# Patient Record
Sex: Male | Born: 1965
Health system: Southern US, Community
[De-identification: ages and names within clinical notes are randomized; demographics above are authoritative.]

## PROBLEM LIST (undated history)

## (undated) DIAGNOSIS — T8859XA Other complications of anesthesia, initial encounter: Secondary | ICD-10-CM

## (undated) DIAGNOSIS — N529 Male erectile dysfunction, unspecified: Secondary | ICD-10-CM

## (undated) DIAGNOSIS — M1990 Unspecified osteoarthritis, unspecified site: Secondary | ICD-10-CM

## (undated) DIAGNOSIS — N289 Disorder of kidney and ureter, unspecified: Secondary | ICD-10-CM

## (undated) DIAGNOSIS — Z87442 Personal history of urinary calculi: Secondary | ICD-10-CM

## (undated) DIAGNOSIS — IMO0001 Reserved for inherently not codable concepts without codable children: Secondary | ICD-10-CM

## (undated) DIAGNOSIS — K219 Gastro-esophageal reflux disease without esophagitis: Secondary | ICD-10-CM

## (undated) DIAGNOSIS — T8484XA Pain due to internal orthopedic prosthetic devices, implants and grafts, initial encounter: Secondary | ICD-10-CM

## (undated) DIAGNOSIS — C801 Malignant (primary) neoplasm, unspecified: Secondary | ICD-10-CM

## (undated) DIAGNOSIS — I10 Essential (primary) hypertension: Secondary | ICD-10-CM

## (undated) HISTORY — PX: FOOT FRACTURE SURGERY: SHX645

## (undated) HISTORY — DX: Essential (primary) hypertension: I10

## (undated) HISTORY — PX: BREAST SURGERY: SHX581

## (undated) HISTORY — DX: Reserved for inherently not codable concepts without codable children: IMO0001

## (undated) HISTORY — DX: Male erectile dysfunction, unspecified: N52.9

## (undated) HISTORY — PX: OTHER SURGICAL HISTORY: SHX169

---

## 2006-04-29 ENCOUNTER — Encounter: Admission: RE | Admit: 2006-04-29 | Discharge: 2006-07-28 | Payer: Self-pay | Admitting: Family Medicine

## 2009-02-06 ENCOUNTER — Ambulatory Visit (HOSPITAL_COMMUNITY): Admission: RE | Admit: 2009-02-06 | Discharge: 2009-02-06 | Payer: Self-pay | Admitting: Surgery

## 2009-02-06 HISTORY — PX: HERNIA REPAIR: SHX51

## 2009-12-31 ENCOUNTER — Emergency Department (HOSPITAL_COMMUNITY): Admission: EM | Admit: 2009-12-31 | Discharge: 2009-12-31 | Payer: Self-pay | Admitting: Family Medicine

## 2010-07-04 LAB — BASIC METABOLIC PANEL
Calcium: 9.1 mg/dL (ref 8.4–10.5)
GFR calc Af Amer: >60 mL/min (ref >60)
GFR calc non Af Amer: >60 mL/min (ref >60)
Sodium: 142 mEq/L (ref 135–145)

## 2010-07-10 ENCOUNTER — Emergency Department (HOSPITAL_COMMUNITY)
Admission: EM | Admit: 2010-07-10 | Discharge: 2010-07-11 | Disposition: A | Discharge status: Home / Self Care | Payer: 59 | Attending: Emergency Medicine | Admitting: Emergency Medicine

## 2010-07-10 DIAGNOSIS — T50901A Poisoning by unspecified drugs, medicaments and biological substances, accidental (unintentional), initial encounter: Secondary | ICD-10-CM | POA: Insufficient documentation

## 2010-07-10 DIAGNOSIS — F3289 Other specified depressive episodes: Secondary | ICD-10-CM | POA: Insufficient documentation

## 2010-07-10 DIAGNOSIS — F329 Major depressive disorder, single episode, unspecified: Secondary | ICD-10-CM | POA: Insufficient documentation

## 2010-07-10 DIAGNOSIS — I10 Essential (primary) hypertension: Secondary | ICD-10-CM | POA: Insufficient documentation

## 2010-07-10 LAB — CBC
HCT: 44.1 % (ref 39.0–52.0)
Hemoglobin: 15 g/dL (ref 13.0–17.0)
WBC: 8.9 10*3/uL (ref 4.0–10.5)

## 2010-07-10 LAB — DIFFERENTIAL
Basophils Absolute: 0 10*3/uL (ref 0.0–0.1)
Lymphocytes Relative: 18 % (ref 12–46)
Neutro Abs: 6.2 10*3/uL (ref 1.7–7.7)
Neutrophils Relative %: 70 % (ref 43–77)

## 2010-07-10 LAB — BLOOD GAS, ARTERIAL
Bicarbonate: 24.8 mEq/L — ABNORMAL HIGH (ref 20.0–24.0)
O2 Content: 2 L/min
O2 Saturation: 96.8 %
pCO2 arterial: 40.9 mmHg (ref 35.0–45.0)
pO2, Arterial: 86.3 mmHg (ref 80.0–100.0)

## 2010-07-10 LAB — COMPREHENSIVE METABOLIC PANEL
ALT: 44 U/L (ref 0–53)
AST: 47 U/L — ABNORMAL HIGH (ref 0–37)
Albumin: 3.8 g/dL (ref 3.5–5.2)
CO2: 23 mEq/L (ref 19–32)
Calcium: 8.5 mg/dL (ref 8.4–10.5)
Chloride: 109 mEq/L (ref 96–112)
GFR calc Af Amer: >60 mL/min (ref >60)
GFR calc non Af Amer: >60 mL/min (ref >60)
Sodium: 139 mEq/L (ref 135–145)
Total Bilirubin: 0.8 mg/dL (ref 0.3–1.2)

## 2010-11-20 ENCOUNTER — Ambulatory Visit (INDEPENDENT_AMBULATORY_CARE_PROVIDER_SITE_OTHER): Payer: 59 | Admitting: Surgery

## 2010-11-28 ENCOUNTER — Encounter (INDEPENDENT_AMBULATORY_CARE_PROVIDER_SITE_OTHER): Payer: Self-pay | Admitting: Surgery

## 2010-11-28 ENCOUNTER — Encounter (INDEPENDENT_AMBULATORY_CARE_PROVIDER_SITE_OTHER): Payer: Self-pay

## 2010-11-28 ENCOUNTER — Ambulatory Visit (INDEPENDENT_AMBULATORY_CARE_PROVIDER_SITE_OTHER): Payer: 59 | Admitting: Surgery

## 2010-11-28 VITALS — BP 114/72 | HR 78 | Temp 96.7°F | Ht 73.0 in | Wt 285.8 lb

## 2010-11-28 DIAGNOSIS — IMO0001 Reserved for inherently not codable concepts without codable children: Secondary | ICD-10-CM | POA: Insufficient documentation

## 2010-11-28 DIAGNOSIS — E669 Obesity, unspecified: Secondary | ICD-10-CM

## 2010-11-28 DIAGNOSIS — R1031 Right lower quadrant pain: Secondary | ICD-10-CM | POA: Insufficient documentation

## 2010-11-28 DIAGNOSIS — R1906 Epigastric swelling, mass or lump: Secondary | ICD-10-CM | POA: Insufficient documentation

## 2010-11-28 NOTE — Patient Instructions (Signed)
Managing Pain  Pain after surgery or related to activity is often due to strain/injury to muscle, tendon, nerves and/or incisions.  This pain is usually short-term and will improve in a few months.   Many people find it helpful to do the following things TOGETHER to help speed the process of healing and to get back to regular activity more quickly:  1. Avoid heavy physical activity a.  no lifting greater than 20 pounds b. Do not "push through" the pain.  Listen to your body and avoid positions and maneuvers than reproduce the pain c. Walking is okay as tolerated, but go slowly and stop when getting sore.  d. Remember: If it hurts to do it, then don't do it! 2. Take Anti-inflammatory medication  a. Take with food/snack around the clock for 1-2 weeks i. This helps the muscle and nerve tissues become less irritable and calm down faster b. Choose ONE of the following over-the-counter medications: i. Naproxen 220mg tabs (ex. Aleve) 2 pills twice a day  ii. Ibuprofen 200mg tabs (ex. Advil, Motrin) 3-4 pills with every meal and just before bedtime iii. Acetaminophen 500mg tabs (Tylenol) 1-2 pills with every meal and just before bedtime 3. Use a Heating pad or Ice/Cold Pack a. 4-6 times a day b. May use warm bath/hottub  or showers 4. Try Gentle Massage and/or Stretching  a. at the area of pain many times a day b. stop if you feel pain - do not overdo it  Try these steps together to help you body heal faster and avoid making things get worse.  Doing just one of these things may not be enough.    If you are not getting better after two weeks or are noticing you are getting worse, contact our office for further advice; we may need to re-evaluate you & see what other things we can do to help.   

## 2010-11-28 NOTE — Progress Notes (Signed)
Subjective:     Patient ID: Aaron Moore, male   DOB: 01/28/1966, 45 y.o.   MRN: [id number]  HPI  Diagnosis: Supraumbilical and umbilical ventral hernia  Procedure: Diagnostic laparoscopy and laparoscopic ventral hernia underlay meshx2 November 2010  Reason for visit: Abdominal mass and pain.  Patient is a obese male who I did a surgical repair of hernias almost 2 years ago. He notes every 6 months he will occasionally get discomfort to the right and inferior to his belly button. It lasts for a few days and goes away. He takes ibuprofen for it. He also notes that since his surgery he's always had a lump just superior and to the left of his belly button. The site of suprumbilical hernia spot. It does not get larger or smaller. He just notes a subtle mass there. He was concerned about a recurrent hernia. He has gained about 20 pounds since we saw him 2 years ago. He knows that this and is trying to lose weight.  Review of Systems  Constitutional: Negative for fever, chills and diaphoresis.  HENT: Negative for nosebleeds, sore throat, facial swelling, mouth sores, trouble swallowing and ear discharge.   Eyes: Negative for photophobia, discharge and visual disturbance.  Respiratory: Negative for choking, chest tightness, shortness of breath and stridor.   Cardiovascular: Negative for chest pain and palpitations.  Gastrointestinal: Negative for nausea, vomiting, diarrhea, constipation, blood in stool, abdominal distention, anal bleeding and rectal pain.       Per HPI  Genitourinary: Negative for dysuria, urgency, difficulty urinating and testicular pain.  Musculoskeletal: Negative for myalgias, back pain, arthralgias and gait problem.  Skin: Negative for color change, pallor, rash and wound.  Neurological: Negative for dizziness, speech difficulty, weakness, numbness and headaches.  Hematological: Negative for adenopathy. Does not bruise/bleed easily.  Psychiatric/Behavioral: Negative for  hallucinations, confusion and agitation.       Objective:   Physical Exam  Constitutional: He is oriented to person, place, and time. He appears well-developed and well-nourished. No distress.  HENT:  Head: Normocephalic.  Mouth/Throat: Oropharynx is clear and moist. No oropharyngeal exudate.  Eyes: Conjunctivae and EOM are normal. Pupils are equal, round, and reactive to light. No scleral icterus.  Neck: Normal range of motion. Neck supple. No tracheal deviation present.  Cardiovascular: Normal rate, regular rhythm and intact distal pulses.   Pulmonary/Chest: Effort normal and breath sounds normal. No respiratory distress.  Abdominal: Soft. He exhibits no distension. There is no rebound and no guarding. Hernia confirmed negative in the right inguinal area and confirmed negative in the left inguinal area.       Left supraumbilical swelling/mass 3 x 3 x 1/2 cm.  Fixed, soft, non tender, No change with cough.  No hernia.  Sensitive at skin of umbilicus.  No hernia/pain.  Musculoskeletal: Normal range of motion. He exhibits no tenderness.  Lymphadenopathy:    He has no cervical adenopathy.       Right: No inguinal adenopathy present.       Left: No inguinal adenopathy present.  Neurological: He is alert and oriented to person, place, and time. No cranial nerve deficit. He exhibits normal muscle tone. Coordination normal.  Skin: Skin is warm and dry. No rash noted. He is not diaphoretic. No erythema. No pallor.  Psychiatric: He has a normal mood and affect. His behavior is normal. Judgment and thought content normal.       Assessment:     Infraumbilical pain most likely secondary to muscle strain. No  definite hernia.  Left supraumbilical mass. Probable old hernia sac since it is not changed in size in 2 years nor is very bothersome. Possible small incarcerated recurrent hernia.    Plan:       I think these are just muscle strains and all hernia sacs. I recommended he try ibuprofen  q.i.d. and heat q.i.d. for 3 weeks. Hopefully that'll help things calmed down. He's not severely symptomatic right now.  Another option would be to do diagnostic laparoscopy and see if he has hernia recurrence to do a laparoscopic vs open repair. I could excise that mass of the palate is old hernia sac or lipoma. I did not press too strongly as it is my suspicion is that super high. However, with his increasing obesity, he is at a above-average risk for recurrence.

## 2010-11-29 ENCOUNTER — Ambulatory Visit (INDEPENDENT_AMBULATORY_CARE_PROVIDER_SITE_OTHER): Payer: 59 | Admitting: Surgery

## 2011-04-24 ENCOUNTER — Other Ambulatory Visit: Payer: Self-pay | Admitting: Orthopedic Surgery

## 2011-04-30 ENCOUNTER — Encounter (HOSPITAL_BASED_OUTPATIENT_CLINIC_OR_DEPARTMENT_OTHER): Payer: Self-pay | Admitting: *Deleted

## 2011-04-30 NOTE — Progress Notes (Signed)
To come in for bmet 

## 2011-05-03 ENCOUNTER — Ambulatory Visit (HOSPITAL_BASED_OUTPATIENT_CLINIC_OR_DEPARTMENT_OTHER)
Admission: RE | Admit: 2011-05-03 | Discharge: 2011-05-03 | Disposition: A | Discharge status: Home / Self Care | Payer: 59 | Source: Ambulatory Visit | Attending: Orthopedic Surgery | Admitting: Orthopedic Surgery

## 2011-05-03 ENCOUNTER — Ambulatory Visit (HOSPITAL_BASED_OUTPATIENT_CLINIC_OR_DEPARTMENT_OTHER): Payer: 59 | Admitting: Certified Registered Nurse Anesthetist

## 2011-05-03 ENCOUNTER — Encounter (HOSPITAL_BASED_OUTPATIENT_CLINIC_OR_DEPARTMENT_OTHER)
Admission: RE | Disposition: A | Discharge status: Home / Self Care | Payer: Self-pay | Source: Ambulatory Visit | Attending: Orthopedic Surgery

## 2011-05-03 ENCOUNTER — Encounter (HOSPITAL_BASED_OUTPATIENT_CLINIC_OR_DEPARTMENT_OTHER): Payer: Self-pay | Admitting: Orthopedic Surgery

## 2011-05-03 ENCOUNTER — Encounter (HOSPITAL_BASED_OUTPATIENT_CLINIC_OR_DEPARTMENT_OTHER): Payer: Self-pay | Admitting: Certified Registered Nurse Anesthetist

## 2011-05-03 DIAGNOSIS — E669 Obesity, unspecified: Secondary | ICD-10-CM | POA: Insufficient documentation

## 2011-05-03 DIAGNOSIS — G5600 Carpal tunnel syndrome, unspecified upper limb: Secondary | ICD-10-CM | POA: Insufficient documentation

## 2011-05-03 DIAGNOSIS — I10 Essential (primary) hypertension: Secondary | ICD-10-CM | POA: Insufficient documentation

## 2011-05-03 HISTORY — PX: CARPAL TUNNEL RELEASE: SHX101

## 2011-05-03 SURGERY — CARPAL TUNNEL RELEASE
Anesthesia: Regional | Site: Hand | Laterality: Left | Wound class: Clean

## 2011-05-03 MED ORDER — PROPOFOL 10 MG/ML IV EMUL
INTRAVENOUS | Status: DC | PRN
  Administered 2011-05-03: 100 ug/kg/min via INTRAVENOUS

## 2011-05-03 MED ORDER — FENTANYL CITRATE 0.05 MG/ML IJ SOLN
INTRAMUSCULAR | Status: DC | PRN
  Administered 2011-05-03: 50 ug via INTRAVENOUS

## 2011-05-03 MED ORDER — CEFAZOLIN SODIUM-DEXTROSE 2-3 GM-% IV SOLR
2.0000 g | INTRAVENOUS | Status: AC
  Administered 2011-05-03: 2 g via INTRAVENOUS

## 2011-05-03 MED ORDER — LACTATED RINGERS IV SOLN
INTRAVENOUS | Status: DC
  Administered 2011-05-03 (×2): via INTRAVENOUS

## 2011-05-03 MED ORDER — MIDAZOLAM HCL 5 MG/5ML IJ SOLN
INTRAMUSCULAR | Status: DC | PRN
  Administered 2011-05-03: 2 mg via INTRAVENOUS

## 2011-05-03 MED ORDER — CHLORHEXIDINE GLUCONATE 4 % EX LIQD: 60.0000 mL | Freq: Once | CUTANEOUS | Status: DC

## 2011-05-03 MED ORDER — LIDOCAINE HCL (PF) 0.5 % IJ SOLN
INTRAMUSCULAR | Status: DC | PRN
  Administered 2011-05-03: 30 mL

## 2011-05-03 MED ORDER — CEFAZOLIN SODIUM 1-5 GM-% IV SOLN: 1.0000 g | INTRAVENOUS | Status: DC

## 2011-05-03 MED ORDER — ONDANSETRON HCL 4 MG/2ML IJ SOLN
INTRAMUSCULAR | Status: DC | PRN
  Administered 2011-05-03: 4 mg via INTRAVENOUS

## 2011-05-03 MED ORDER — HYDROCODONE-ACETAMINOPHEN 5-500 MG PO TABS: 1.0000 | ORAL_TABLET | ORAL | Status: AC | PRN

## 2011-05-03 MED ORDER — BUPIVACAINE HCL (PF) 0.25 % IJ SOLN
INTRAMUSCULAR | Status: DC | PRN
  Administered 2011-05-03: 6 mL

## 2011-05-03 MED ORDER — FENTANYL CITRATE 0.05 MG/ML IJ SOLN: 50.0000 ug | INTRAMUSCULAR | Status: DC | PRN

## 2011-05-03 MED ORDER — MIDAZOLAM HCL 2 MG/2ML IJ SOLN: 1.0000 mg | INTRAMUSCULAR | Status: DC | PRN

## 2011-05-03 MED ORDER — LIDOCAINE HCL (CARDIAC) 20 MG/ML IV SOLN
INTRAVENOUS | Status: DC | PRN
  Administered 2011-05-03: 60 mg via INTRAVENOUS

## 2011-05-03 SURGICAL SUPPLY — 37 items
BANDAGE GAUZE ELAST BULKY 4 IN (GAUZE/BANDAGES/DRESSINGS) ×2 IMPLANT
BLADE SURG 15 STRL LF DISP TIS (BLADE) ×1 IMPLANT
BLADE SURG 15 STRL SS (BLADE) ×1
BNDG COHESIVE 3X5 TAN STRL LF (GAUZE/BANDAGES/DRESSINGS) ×2 IMPLANT
BNDG ESMARK 4X9 LF (GAUZE/BANDAGES/DRESSINGS) IMPLANT
CHLORAPREP W/TINT 26ML (MISCELLANEOUS) ×2 IMPLANT
CLOTH BEACON ORANGE TIMEOUT ST (SAFETY) ×2 IMPLANT
CORDS BIPOLAR (ELECTRODE) ×2 IMPLANT
COVER MAYO STAND STRL (DRAPES) ×2 IMPLANT
COVER TABLE BACK 60X90 (DRAPES) ×2 IMPLANT
CUFF TOURNIQUET SINGLE 18IN (TOURNIQUET CUFF) ×2 IMPLANT
DRAPE EXTREMITY T 121X128X90 (DRAPE) ×2 IMPLANT
DRAPE SURG 17X23 STRL (DRAPES) ×2 IMPLANT
DRSG KUZMA FLUFF (GAUZE/BANDAGES/DRESSINGS) ×2 IMPLANT
GAUZE XEROFORM 1X8 LF (GAUZE/BANDAGES/DRESSINGS) ×2 IMPLANT
GLOVE BIO SURGEON STRL SZ 6.5 (GLOVE) ×2 IMPLANT
GLOVE BIOGEL PI IND STRL 7.0 (GLOVE) ×1 IMPLANT
GLOVE BIOGEL PI INDICATOR 7.0 (GLOVE) ×1
GLOVE ECLIPSE 6.5 STRL STRAW (GLOVE) ×2 IMPLANT
GLOVE SURG ORTHO 8.0 STRL STRW (GLOVE) ×2 IMPLANT
GOWN BRE IMP PREV XXLGXLNG (GOWN DISPOSABLE) ×2 IMPLANT
GOWN PREVENTION PLUS XLARGE (GOWN DISPOSABLE) ×2 IMPLANT
NEEDLE 27GAX1X1/2 (NEEDLE) ×2 IMPLANT
NS IRRIG 1000ML POUR BTL (IV SOLUTION) ×2 IMPLANT
PACK BASIN DAY SURGERY FS (CUSTOM PROCEDURE TRAY) ×2 IMPLANT
PAD CAST 3X4 CTTN HI CHSV (CAST SUPPLIES) ×1 IMPLANT
PADDING CAST ABS 4INX4YD NS (CAST SUPPLIES) ×1
PADDING CAST ABS COTTON 4X4 ST (CAST SUPPLIES) ×1 IMPLANT
PADDING CAST COTTON 3X4 STRL (CAST SUPPLIES) ×1
SPONGE GAUZE 4X4 12PLY (GAUZE/BANDAGES/DRESSINGS) ×2 IMPLANT
STOCKINETTE 4X48 STRL (DRAPES) ×2 IMPLANT
SUT VICRYL 4-0 PS2 18IN ABS (SUTURE) IMPLANT
SUT VICRYL RAPIDE 4/0 PS 2 (SUTURE) ×2 IMPLANT
SYR BULB 3OZ (MISCELLANEOUS) ×2 IMPLANT
SYR CONTROL 10ML LL (SYRINGE) ×2 IMPLANT
TOWEL OR 17X24 6PK STRL BLUE (TOWEL DISPOSABLE) ×2 IMPLANT
UNDERPAD 30X30 INCONTINENT (UNDERPADS AND DIAPERS) ×2 IMPLANT

## 2011-05-03 NOTE — Anesthesia Postprocedure Evaluation (Signed)
  Anesthesia Post-op Note  Patient: Aaron Moore  Procedure(s) Performed:  CARPAL TUNNEL RELEASE  Patient Location: PACU  Anesthesia Type: Bier block  Level of Consciousness: awake, alert  and oriented  Airway and Oxygen Therapy: Patient Spontanous Breathing  Post-op Pain: none  Post-op Assessment: Post-op Vital signs reviewed, Patient's Cardiovascular Status Stable, Respiratory Function Stable, Patent Airway, No signs of Nausea or vomiting and Pain level controlled  Post-op Vital Signs: Reviewed and stable  Complications: No apparent anesthesia complications

## 2011-05-03 NOTE — Op Note (Signed)
Dictated number: [id number]

## 2011-05-03 NOTE — Brief Op Note (Signed)
05/03/2011  1:52 PM  PATIENT:  Aaron Moore  46 y.o. male  PRE-OPERATIVE DIAGNOSIS:  left cts  POST-OPERATIVE DIAGNOSIS:  Left Carpal Tunnel Syndrome  PROCEDURE:  Procedure(s): CARPAL TUNNEL RELEASE  SURGEON:  Surgeon(s): Nicki Reaper, MD  PHYSICIAN ASSISTANT:   ASSISTANTS: none   ANESTHESIA:   Regional local EBL:  Total I/O In: 1000 [I.V.:1000] Out: -   BLOOD ADMINISTERED:none  DRAINS: none   LOCAL MEDICATIONS USED:  MARCAINE 6CC  SPECIMEN:  No Specimen  DISPOSITION OF SPECIMEN:  N/A  COUNTS:  YES  TOURNIQUET:   Total Tourniquet Time Documented: Forearm (Left) - 22 minutes  DICTATION: .Other Dictation: Dictation Number [id number]  PLAN OF CARE: Discharge to home after PACU  PATIENT DISPOSITION:  PACU - hemodynamically stable.

## 2011-05-03 NOTE — Anesthesia Preprocedure Evaluation (Signed)
Anesthesia Evaluation  Patient identified by MRN, date of birth, ID band Patient awake    Reviewed: Allergy & Precautions, H&P , NPO status , Patient's Chart, lab work & pertinent test results  Airway Mallampati: II TM Distance: >3 FB Neck ROM: Full    Dental   Pulmonary    Pulmonary exam normal       Cardiovascular hypertension,     Neuro/Psych    GI/Hepatic   Endo/Other  Morbid obesity  Renal/GU      Musculoskeletal   Abdominal (+) obese,   Peds  Hematology   Anesthesia Other Findings   Reproductive/Obstetrics                           Anesthesia Physical Anesthesia Plan  ASA: III  Anesthesia Plan: Bier Block and MAC   Post-op Pain Management:    Induction: Intravenous  Airway Management Planned: Simple Face Mask  Additional Equipment:   Intra-op Plan:   Post-operative Plan: Extubation in OR  Informed Consent: I have reviewed the patients History and Physical, chart, labs and discussed the procedure including the risks, benefits and alternatives for the proposed anesthesia with the patient or authorized representative who has indicated his/her understanding and acceptance.     Plan Discussed with: CRNA and Surgeon  Anesthesia Plan Comments:         Anesthesia Quick Evaluation

## 2011-05-03 NOTE — Transfer of Care (Signed)
Immediate Anesthesia Transfer of Care Note  Patient: Aaron Moore  Procedure(s) Performed:  CARPAL TUNNEL RELEASE  Patient Location: PACU  Anesthesia Type: GA combined with regional for post-op pain  Level of Consciousness: awake, alert , oriented and patient cooperative  Airway & Oxygen Therapy: Patient Spontanous Breathing and Patient connected to face mask oxygen  Post-op Assessment: Report given to PACU RN and Post -op Vital signs reviewed and stable  Post vital signs: Reviewed and stable  Complications: No apparent anesthesia complications

## 2011-05-03 NOTE — H&P (Signed)
Aaron Moore is a 46 year old right hand dominant male who comes in complaining of a mass in the palmar aspect of his left middle finger. He states this has been present for the past 3-4 years. He is complaining of pain and stiffness. He has a history of gout for which he takes Indocin. He takes Aleve on a regular basis. He states that he has a tendency to rub his hand. He is occasionally awakened at night with numbness. He states he has a constant moderate occasionally sharp throbbing type pain with a feeling of swelling, numbness and weakness. He states it is progressively getting worse. Activity, exercise, work, especially driving increases this, left much greater than right. He states that frequently in the morning it takes a period of time to get his fingers moving. He has had gouty attacks in his wrist. No history of diabetes, thyroid problems, or arthritis. There is a family history of diabetes and arthritis. He has been tested for diabetes.    Past Medical History: He has no allergies. He is on Indomethacin, Allopurinol and Lisinopril. He relates no surgery.  Family Medical History: Positive for diabetes, heart disease, high BP and arthritis.  Social History: He does not smoke or drink. He is married and a truck Hospital doctor.  Review of Systems: Positive for high BP and gout, otherwise negative. Aaron Moore is an 46 y.o. male.   Chief Complaint: CTS HPI: aee above  Past Medical History  Diagnosis Date  . Gout   . Abdominal pain   . Obesity, Class II, BMI 35-39.9, with comorbidity   . ED (erectile dysfunction)   . Gout   . Hypertension     Past Surgical History  Procedure Date  . Hernia repair 02/06/2009    Lap supraumb & umb VWH repairs    Family History  Problem Relation Age of Onset  . Diabetes Mother   . Diabetes Father    Social History:  reports that he has never smoked. He does not have any smokeless tobacco history on file. He reports that he does not drink alcohol or  use illicit drugs.  Allergies: No Known Allergies  No current facility-administered medications on file as of 05/03/2011.   Medications Prior to Admission  Medication Sig Dispense Refill  . allopurinol (ZYLOPRIM) 300 MG tablet Take 300 mg by mouth daily.        . indomethacin (INDOCIN) 50 MG capsule Take 50 mg by mouth 2 (two) times daily with a meal.        . lisinopril-hydrochlorothiazide (PRINZIDE,ZESTORETIC) 20-12.5 MG per tablet Take 1 tablet by mouth daily.          No results found for this or any previous visit (from the past 48 hour(s)).  No results found.   Pertinent items are noted in HPI.  Height 6\' 1"  (1.854 m), weight 127.007 kg (280 lb).  General appearance: alert, cooperative and appears stated age Head: Normocephalic, without obvious abnormality Neck: no adenopathy Resp: clear to auscultation bilaterally Cardio: regular rate and rhythm, S1, S2 normal, no murmur, click, rub or gallop GI: soft, non-tender; bowel sounds normal; no masses,  no organomegaly Extremities: extremities normal, atraumatic, no cyanosis or edema Pulses: 2+ and symmetric Skin: Skin color, texture, turgor normal. No rashes or lesions Neurologic: Grossly normal Incision/Wound: na  Assessment/Plan He has had his nerve conductions done by Dr. Johna Roles and this reveals motor delay of 7.6 on the left and 4.6 on the right.  Sensory delay of 3.3  on the left, 2.7 on the right. We have discussed with him the possibility of surgical decompression to the median nerve especially on his left side.    The pre, peri and postoperative course were discussed along with the risks and complications.  The patient is aware there is no guarantee with the surgery, possibility of infection, recurrence, injury to arteries, nerves, tendons, incomplete relief of symptoms and dystrophy.  He would like to think this over and would like to check with his job.  Seng Fouts R 05/03/2011, 11:26 AM

## 2011-05-06 NOTE — Op Note (Signed)
NAMEWREN, GALLAGA NO.:  [id number]  MEDICAL RECORD NO.:  [id number]  LOCATION:                                 FACILITY:  PHYSICIAN:  Cindee Salt, M.D.            DATE OF BIRTH:  DATE OF PROCEDURE:  05/03/2011 DATE OF DISCHARGE:                              OPERATIVE REPORT   PREOPERATIVE DIAGNOSIS:  Carpal tunnel syndrome, left hand.  POSTOPERATIVE DIAGNOSIS:  Carpal tunnel syndrome, left hand.  OPERATION:  Decompression of left median nerve.  SURGEON:  Cindee Salt, MD  ANESTHESIA:  Forearm based IV regional and local infiltration.  ANESTHESIOLOGIST:  Bedelia Person, MD.  HISTORY:  The patient is a 46 year old male with history of a mass on this finger and he is complaining of numbness and tingling subsequent to this. Nerve conductions has been done revealing carpal tunnel syndrome bilaterally.  He is desirous of surgical release.  Pre, peri, and postoperative course had been discussed along with risks and complications.  He is aware that there is no guarantee with surgery, possibility of infection, recurrence, injury to arteries, nerves, tendons, incomplete relief of symptoms, dystrophy.  In the preoperative area, the patient is seen.  The extremity marked by both the patient and surgeon.  Antibiotic given.  PROCEDURE:  The patient was brought to the operating room where a forearm based IV regional anesthetic was carried out without difficulty. He was prepped using ChloraPrep, supine position with the left arm free. A 3-minute dry time was allowed.  Time-out taken confirming the patient and procedure.  A longitudinal incision was made in the palm, left hand, carried down through subcutaneous tissue.  Bleeders were electrocauterized with bipolar.  Palmar fascia was split.  Superficial palmar arch identified.  Flexor tendon of the ring and little finger identified to the ulnar side of median nerve.  Carpal retinaculum was incised with sharp dissection.   Right angle and Sewall retractor were placed between skin and forearm fascia.  The fascia released for approximately a centimeter and half proximal to the wrist crease under direct vision.  Canal was explored.  Area compression to the nerve was apparent.  No further lesions were identified.  The wound was irrigated. Skin closed with interrupted 4-0 Vicryl each sutures.  Local infiltration with 0.25% Marcaine without epinephrine was given, 6 mL was used.  Sterile compressive dressing with fingers free was applied.  On deflation of the tourniquet, all fingers immediately pinked.  He was taken to the recovery room for observation in satisfactory condition.  He will be discharged home to return to [facility] [place] in 1 week, on Vicodin.          ______________________________ Cindee Salt, M.D.     GK/MEDQ  D:  05/03/2011  T:  05/04/2011  Job:  [id number]

## 2011-05-08 ENCOUNTER — Encounter (HOSPITAL_BASED_OUTPATIENT_CLINIC_OR_DEPARTMENT_OTHER): Payer: Self-pay | Admitting: Orthopedic Surgery

## 2011-09-27 ENCOUNTER — Emergency Department (HOSPITAL_COMMUNITY)
Admission: EM | Admit: 2011-09-27 | Discharge: 2011-09-27 | Disposition: A | Discharge status: Home / Self Care | Payer: No Typology Code available for payment source | Attending: Emergency Medicine | Admitting: Emergency Medicine

## 2011-09-27 ENCOUNTER — Emergency Department (HOSPITAL_COMMUNITY): Payer: No Typology Code available for payment source

## 2011-09-27 ENCOUNTER — Encounter (HOSPITAL_COMMUNITY): Payer: Self-pay | Admitting: Emergency Medicine

## 2011-09-27 DIAGNOSIS — M542 Cervicalgia: Secondary | ICD-10-CM | POA: Insufficient documentation

## 2011-09-27 DIAGNOSIS — R61 Generalized hyperhidrosis: Secondary | ICD-10-CM | POA: Insufficient documentation

## 2011-09-27 DIAGNOSIS — I10 Essential (primary) hypertension: Secondary | ICD-10-CM | POA: Insufficient documentation

## 2011-09-27 DIAGNOSIS — Z79899 Other long term (current) drug therapy: Secondary | ICD-10-CM | POA: Insufficient documentation

## 2011-09-27 DIAGNOSIS — Z862 Personal history of diseases of the blood and blood-forming organs and certain disorders involving the immune mechanism: Secondary | ICD-10-CM | POA: Insufficient documentation

## 2011-09-27 DIAGNOSIS — Z8639 Personal history of other endocrine, nutritional and metabolic disease: Secondary | ICD-10-CM | POA: Insufficient documentation

## 2011-09-27 DIAGNOSIS — S161XXA Strain of muscle, fascia and tendon at neck level, initial encounter: Secondary | ICD-10-CM

## 2011-09-27 DIAGNOSIS — S139XXA Sprain of joints and ligaments of unspecified parts of neck, initial encounter: Secondary | ICD-10-CM | POA: Insufficient documentation

## 2011-09-27 MED ORDER — IBUPROFEN 800 MG PO TABS: 800.0000 mg | ORAL_TABLET | Freq: Once | ORAL | Status: DC
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
## 2011-09-27 NOTE — ED Notes (Signed)
Pt verbalizes understanding 
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
## 2015-03-08 DIAGNOSIS — M7702 Medial epicondylitis, left elbow: Secondary | ICD-10-CM | POA: Insufficient documentation

## 2016-11-29 DIAGNOSIS — M7702 Medial epicondylitis, left elbow: Secondary | ICD-10-CM | POA: Diagnosis not present

## 2017-02-18 DIAGNOSIS — M545 Low back pain: Secondary | ICD-10-CM | POA: Diagnosis not present

## 2017-02-18 DIAGNOSIS — M79605 Pain in left leg: Secondary | ICD-10-CM | POA: Diagnosis not present

## 2017-02-18 DIAGNOSIS — M25522 Pain in left elbow: Secondary | ICD-10-CM | POA: Diagnosis not present

## 2017-02-18 DIAGNOSIS — I10 Essential (primary) hypertension: Secondary | ICD-10-CM | POA: Diagnosis not present

## 2017-06-09 ENCOUNTER — Encounter (HOSPITAL_COMMUNITY): Payer: Self-pay

## 2017-06-09 ENCOUNTER — Emergency Department (HOSPITAL_COMMUNITY)
Admission: EM | Admit: 2017-06-09 | Discharge: 2017-06-09 | Disposition: A | Discharge status: Home / Self Care | Payer: 59 | Attending: Emergency Medicine | Admitting: Emergency Medicine

## 2017-06-09 ENCOUNTER — Other Ambulatory Visit: Payer: Self-pay

## 2017-06-09 DIAGNOSIS — Z5321 Procedure and treatment not carried out due to patient leaving prior to being seen by health care provider: Secondary | ICD-10-CM | POA: Diagnosis not present

## 2017-06-09 DIAGNOSIS — J019 Acute sinusitis, unspecified: Secondary | ICD-10-CM | POA: Diagnosis not present

## 2017-06-09 DIAGNOSIS — R51 Headache: Secondary | ICD-10-CM | POA: Diagnosis not present

## 2017-06-09 DIAGNOSIS — I10 Essential (primary) hypertension: Secondary | ICD-10-CM | POA: Diagnosis not present

## 2017-06-09 NOTE — ED Triage Notes (Signed)
Pt coming in complaining of URI sx for the last week. States for the last several days he has had severe headaches. Pt alert and oriented. States his BP was high at home.

## 2017-06-11 DIAGNOSIS — I10 Essential (primary) hypertension: Secondary | ICD-10-CM | POA: Diagnosis not present

## 2017-06-11 DIAGNOSIS — J019 Acute sinusitis, unspecified: Secondary | ICD-10-CM | POA: Diagnosis not present

## 2017-07-08 DIAGNOSIS — I10 Essential (primary) hypertension: Secondary | ICD-10-CM | POA: Diagnosis not present

## 2017-07-08 DIAGNOSIS — J019 Acute sinusitis, unspecified: Secondary | ICD-10-CM | POA: Diagnosis not present

## 2017-10-21 ENCOUNTER — Emergency Department (HOSPITAL_COMMUNITY): Payer: 59

## 2017-10-21 ENCOUNTER — Emergency Department (HOSPITAL_COMMUNITY)
Admission: EM | Admit: 2017-10-21 | Discharge: 2017-10-22 | Disposition: A | Discharge status: Home / Self Care | Payer: 59 | Attending: Emergency Medicine | Admitting: Emergency Medicine

## 2017-10-21 ENCOUNTER — Encounter (HOSPITAL_COMMUNITY): Payer: Self-pay | Admitting: *Deleted

## 2017-10-21 ENCOUNTER — Other Ambulatory Visit: Payer: Self-pay

## 2017-10-21 DIAGNOSIS — Z79899 Other long term (current) drug therapy: Secondary | ICD-10-CM | POA: Insufficient documentation

## 2017-10-21 DIAGNOSIS — N200 Calculus of kidney: Secondary | ICD-10-CM | POA: Insufficient documentation

## 2017-10-21 DIAGNOSIS — I10 Essential (primary) hypertension: Secondary | ICD-10-CM | POA: Insufficient documentation

## 2017-10-21 DIAGNOSIS — R9389 Abnormal findings on diagnostic imaging of other specified body structures: Secondary | ICD-10-CM

## 2017-10-21 DIAGNOSIS — R1012 Left upper quadrant pain: Secondary | ICD-10-CM | POA: Diagnosis present

## 2017-10-21 HISTORY — DX: Gastro-esophageal reflux disease without esophagitis: K21.9

## 2017-10-21 HISTORY — DX: Disorder of kidney and ureter, unspecified: N28.9

## 2017-10-21 LAB — CBC
HEMATOCRIT: 47.6 % (ref 39.0–52.0)
HEMOGLOBIN: 15.9 g/dL (ref 13.0–17.0)
MCH: 29.7 pg (ref 26.0–34.0)
MCHC: 33.4 g/dL (ref 30.0–36.0)
MCV: 89 fL (ref 78.0–100.0)
Platelets: 188 10*3/uL (ref 150–400)
RBC: 5.35 MIL/uL (ref 4.22–5.81)
RDW: 13.9 % (ref 11.5–15.5)
WBC: 14.3 10*3/uL — ABNORMAL HIGH (ref 4.0–10.5)

## 2017-10-21 LAB — URINALYSIS, ROUTINE W REFLEX MICROSCOPIC
BILIRUBIN URINE: NEGATIVE
Glucose, UA: NEGATIVE mg/dL
Ketones, ur: NEGATIVE mg/dL
Leukocytes, UA: NEGATIVE
Nitrite: NEGATIVE
PH: 7 (ref 5.0–8.0)
Protein, ur: NEGATIVE mg/dL
Specific Gravity, Urine: 1.006 (ref 1.005–1.030)

## 2017-10-21 LAB — COMPREHENSIVE METABOLIC PANEL
ALBUMIN: 4.2 g/dL (ref 3.5–5.0)
ALK PHOS: 82 U/L (ref 38–126)
ALT: 37 U/L (ref 0–44)
ANION GAP: 8 (ref 5–15)
AST: 29 U/L (ref 15–41)
BILIRUBIN TOTAL: 0.6 mg/dL (ref 0.3–1.2)
BUN: 23 mg/dL — ABNORMAL HIGH (ref 6–20)
CALCIUM: 9.1 mg/dL (ref 8.9–10.3)
CO2: 23 mmol/L (ref 22–32)
Chloride: 108 mmol/L (ref 98–111)
Creatinine, Ser: 1.36 mg/dL — ABNORMAL HIGH (ref 0.61–1.24)
GFR calc Af Amer: >60 mL/min (ref >60)
GFR, EST NON AFRICAN AMERICAN: 58 mL/min — AB (ref >60)
GLUCOSE: 156 mg/dL — AB (ref 70–99)
POTASSIUM: 4.2 mmol/L (ref 3.5–5.1)
SODIUM: 139 mmol/L (ref 135–145)
TOTAL PROTEIN: 7.4 g/dL (ref 6.5–8.1)

## 2017-10-21 LAB — LIPASE, BLOOD: Lipase: 33 U/L (ref 11–51)

## 2017-10-21 MED ORDER — OXYCODONE-ACETAMINOPHEN 5-325 MG PO TABS
1.0000 | ORAL_TABLET | Freq: Once | ORAL | Status: AC
  Administered 2017-10-22: 1 via ORAL
  Filled 2017-10-21: qty 1

## 2017-10-21 MED ORDER — OXYCODONE-ACETAMINOPHEN 5-325 MG PO TABS: 1.0000 | ORAL_TABLET | ORAL | 0 refills | Status: DC | PRN

## 2017-10-21 MED ORDER — TAMSULOSIN HCL 0.4 MG PO CAPS
0.4000 mg | ORAL_CAPSULE | Freq: Once | ORAL | Status: AC
  Administered 2017-10-21: 0.4 mg via ORAL
  Filled 2017-10-21: qty 1

## 2017-10-21 MED ORDER — TAMSULOSIN HCL 0.4 MG PO CAPS: 0.4000 mg | ORAL_CAPSULE | Freq: Every day | ORAL | 0 refills | Status: AC

## 2017-10-21 MED ORDER — HYDROMORPHONE HCL 1 MG/ML IJ SOLN
1.0000 mg | Freq: Once | INTRAMUSCULAR | Status: AC
  Administered 2017-10-21: 1 mg via INTRAVENOUS
  Filled 2017-10-21: qty 1

## 2017-10-21 MED ORDER — SODIUM CHLORIDE 0.9 % IV BOLUS
1000.0000 mL | Freq: Once | INTRAVENOUS | Status: AC
  Administered 2017-10-21: 1000 mL via INTRAVENOUS

## 2017-10-21 MED ORDER — KETOROLAC TROMETHAMINE 15 MG/ML IJ SOLN
15.0000 mg | Freq: Once | INTRAMUSCULAR | Status: AC
  Administered 2017-10-21: 15 mg via INTRAVENOUS
  Filled 2017-10-21: qty 1

## 2017-10-21 MED ORDER — ONDANSETRON HCL 4 MG/2ML IJ SOLN
4.0000 mg | Freq: Once | INTRAMUSCULAR | Status: AC
  Administered 2017-10-21: 4 mg via INTRAVENOUS
  Filled 2017-10-21: qty 2

## 2017-10-21 MED ORDER — ONDANSETRON 4 MG PO TBDP: 4.0000 mg | ORAL_TABLET | Freq: Three times a day (TID) | ORAL | 0 refills | Status: DC | PRN

## 2017-10-21 NOTE — ED Provider Notes (Addendum)
[facility] DEPT Provider Note   CSN: [id number] Arrival date & time: 10/21/17  1901     History   Chief Complaint Chief Complaint  Patient presents with  . Abdominal Pain    HPI Aaron Moore is a 52 y.o. male.  HPI   52 year old male with history of hypertension, gout, kidney stones due to uric acid here with severe left flank pain.  The patient states that around 3:00 this afternoon, he developed acute onset of severe, aching, cramping, stabbing, left flank pain.  The pain has subsequently radiated down towards his left lower quadrant.  He has had nausea and vomiting.  He said he is been able to change positions with slight improvement, but this is not completely resolved.  Denies any alleviating factors.  Has had some urinary hesitancy as well.  No fevers.  No change in bowel habits.  Past Medical History:  Diagnosis Date  . Abdominal pain   . ED (erectile dysfunction)   . GERD (gastroesophageal reflux disease)   . Gout   . Gout   . Hypertension   . Obesity, Class II, BMI 35-39.9, with comorbidity   . Renal disorder    kidney stone    Patient Active Problem List   Diagnosis Date Noted  . Obesity, Class II, BMI 35-39.9, with comorbidity 11/28/2010  . Abdominal wall mass of epigastric region - old hernia sac/lipoma 11/28/2010  . Abdominal pain, RLQ - muscle strain 11/28/2010    Past Surgical History:  Procedure Laterality Date  . CARPAL TUNNEL RELEASE  05/03/2011   Procedure: CARPAL TUNNEL RELEASE;  Surgeon: Wynonia Sours, MD;  Location: [facility];  Service: Orthopedics;  Laterality: Left;  . HERNIA REPAIR  02/06/2009   Lap supraumb & umb VWH repairs        Home Medications    Prior to Admission medications   Medication Sig Start Date End Date Taking? Authorizing Provider  allopurinol (ZYLOPRIM) 300 MG tablet Take 300 mg by mouth daily.     Yes [provider]  indomethacin (INDOCIN) 50 MG capsule Take 50  mg by mouth 2 (two) times daily as needed.    Yes [provider]  lisinopril-hydrochlorothiazide (PRINZIDE,ZESTORETIC) 20-12.5 MG per tablet Take 1 tablet by mouth daily.     Yes [provider]  losartan (COZAAR) 100 MG tablet Take 100 mg by mouth daily. 09/01/17  Yes [provider]  meloxicam (MOBIC) 7.5 MG tablet Take 7.5 mg by mouth daily. with food 10/10/17  Yes [provider]  ondansetron (ZOFRAN ODT) 4 MG disintegrating tablet Take 1 tablet (4 mg total) by mouth every 8 (eight) hours as needed for nausea or vomiting. 10/21/17   [name], MD  oxyCODONE-acetaminophen (PERCOCET/ROXICET) 5-325 MG tablet Take 1-2 tablets by mouth every 4 (four) hours as needed for moderate pain or severe pain. 10/21/17   [name], MD  tamsulosin (FLOMAX) 0.4 MG CAPS capsule Take 1 capsule (0.4 mg total) by mouth daily for 7 days. 10/21/17 10/28/17  [name], MD    Family History Family History  Problem Relation Age of Onset  . Diabetes Mother   . Diabetes Father     Social History Social History   Tobacco Use  . Smoking status: Never Smoker  . Smokeless tobacco: Never Used  Substance Use Topics  . Alcohol use: No  . Drug use: No     Allergies   Patient has no known allergies.   Review of  Systems Review of Systems  Constitutional: Positive for fatigue. Negative for chills and fever.  HENT: Negative for congestion and rhinorrhea.   Eyes: Negative for visual disturbance.  Respiratory: Negative for cough, shortness of breath and wheezing.   Cardiovascular: Negative for chest pain and leg swelling.  Gastrointestinal: Positive for nausea and vomiting. Negative for abdominal pain and diarrhea.  Genitourinary: Positive for flank pain. Negative for dysuria.  Musculoskeletal: Negative for neck pain and neck stiffness.  Skin: Negative for rash and wound.  Allergic/Immunologic: Negative for immunocompromised state.  Neurological: Negative for  syncope, weakness and headaches.  All other systems reviewed and are negative.    Physical Exam Updated Vital Signs BP (!) 166/94   Pulse 77   Temp 97.7 F (36.5 C) (Oral)   Resp 16   SpO2 98%   Physical Exam  Constitutional: He is oriented to person, place, and time. He appears well-developed and well-nourished. He appears distressed.  HENT:  Head: Normocephalic and atraumatic.  Eyes: Conjunctivae are normal.  Neck: Neck supple.  Cardiovascular: Normal rate, regular rhythm and normal heart sounds. Exam reveals no friction rub.  No murmur heard. Pulmonary/Chest: Effort normal and breath sounds normal. No respiratory distress. He has no wheezes. He has no rales.  Abdominal: He exhibits no distension. There is no rigidity, no rebound and no guarding.  Moderate left flank tenderness.  Otherwise no tenderness.  No rebound or guarding.  No peritoneal signs.  Musculoskeletal: He exhibits no edema.  Neurological: He is alert and oriented to person, place, and time. He exhibits normal muscle tone.  Skin: Skin is warm. Capillary refill takes less than 2 seconds.  Psychiatric: He has a normal mood and affect.  Nursing note and vitals reviewed.    ED Treatments / Results  Labs (all labs ordered are listed, but only abnormal results are displayed) Labs Reviewed  CBC - Abnormal; Notable for the following components:      Result Value   WBC 14.3 (*)    All other components within normal limits  COMPREHENSIVE METABOLIC PANEL - Abnormal; Notable for the following components:   Glucose, Bld 156 (*)    BUN 23 (*)    Creatinine, Ser 1.36 (*)    GFR calc non Af Amer 58 (*)    All other components within normal limits  LIPASE, BLOOD  URINALYSIS, ROUTINE W REFLEX MICROSCOPIC    EKG None  Radiology Ct Renal Stone Study  Result Date: 10/21/2017 CLINICAL DATA:  52 year old with left flank pain. History of kidney stones. EXAM: CT ABDOMEN AND PELVIS WITHOUT CONTRAST TECHNIQUE:  Multidetector CT imaging of the abdomen and pelvis was performed following the standard protocol without IV contrast. COMPARISON:  None. FINDINGS: Lower chest: Mild atelectasis at both lung bases. No large effusions. Low-density structure along the right side of the pericardium on sequence 2, image 1. This structure measures 3.5 x 1.8 cm. Structure is fluid density and could represent a pericardial cyst. Hepatobiliary: Normal appearance of the liver and gallbladder. No significant biliary dilatation. Pancreas: Unremarkable. No pancreatic ductal dilatation or surrounding inflammatory changes. Spleen: Normal in size without focal abnormality. Adrenals/Urinary Tract: Normal adrenals. Left perinephric edema with multiple left renal stones. 6 mm stone in left kidney upper pole. Mild dilatation of the left renal collecting system with a 5 mm stone in the proximal left ureter. Edema around the proximal left ureter. Normal caliber of the distal left ureter. Urinary bladder is normal. At least 1 stone in the right kidney midpole  region measuring up to 6 mm. Negative for right hydronephrosis. Stomach/Bowel: Normal appearance of stomach and duodenum. Few diverticula in the colon without acute inflammatory changes. Normal appendix. Stool-like material in distal small bowel without wall thickening. Vascular/Lymphatic: Minimal atherosclerotic calcifications in the abdominal aorta without aneurysm. Left iliac arteries are tortuous. No significant lymph node enlargement in the abdomen or pelvis. Reproductive: Prostate is unremarkable. Other: No free fluid. Surgical mesh along the anterior abdominal wall. Despite the mesh material, there is a small umbilical hernia containing fat. Negative for free air. Musculoskeletal: Degenerative facet disease in lower lumbar spine. Disc space narrowing at L5-S1. IMPRESSION: Left perinephric edema with mild left hydronephrosis and a proximal left ureter stone. Multiple left renal calculi.  Nonobstructive right renal calculus. **An incidental finding of potential clinical significance has been found. Partially visualized 3.5 cm low-density structure along the right side of the pericardium. Differential diagnosis would include a pericardial cyst but this area is incompletely evaluated. Recommend a follow-up chest CT.** Electronically Signed   By: Markus Daft M.D.   On: 10/21/2017 21:26    Procedures Procedures (including critical care time)  Medications Ordered in ED Medications  sodium chloride 0.9 % bolus 1,000 mL (1,000 mLs Intravenous New Bag/Given 10/21/17 2231)  HYDROmorphone (DILAUDID) injection 1 mg (1 mg Intravenous Given 10/21/17 2032)  ondansetron (ZOFRAN) injection 4 mg (4 mg Intravenous Given 10/21/17 2032)  sodium chloride 0.9 % bolus 1,000 mL (0 mLs Intravenous Stopped 10/21/17 2211)  HYDROmorphone (DILAUDID) injection 1 mg (1 mg Intravenous Given 10/21/17 2214)  ketorolac (TORADOL) 15 MG/ML injection 15 mg (15 mg Intravenous Given 10/21/17 2214)  tamsulosin (FLOMAX) capsule 0.4 mg (0.4 mg Oral Given 10/21/17 2231)     Initial Impression / Assessment and Plan / ED Course  I have reviewed the triage vital signs and the nursing notes.  Pertinent labs & imaging results that were available during my care of the patient were reviewed by me and considered in my medical decision making (see chart for details).     52 year old male here with severe left flank pain.  CT imaging confirms left 5 mm stone.  Suspect this is etiology for his pain.  No evidence of torsion or alternative etiology.  Lab work shows likely reactive leukocytosis. No fever, no hypotension, no signs of sepsis and he was well prior to onset of pain.  He also appears mildly dehydrated.  Is been given fluids and has had excellent relief with Dilaudid.  Suspect he can be managed as an outpatient.  Will plan to follow-up urinalysis.  If negative, can discharge with outpatient follow-up.  Will refer him to  urology.  UA w/o signs of infection. Feels better, tolerating PO. D/c. Of note, incidental ? Pericardial cyst noted - no CP, SOB, or concerning symptoms. Discussed, will refer for outpt chest CT.  Final Clinical Impressions(s) / ED Diagnoses   Final diagnoses:  Kidney stone on left side    ED Discharge Orders        Ordered    oxyCODONE-acetaminophen (PERCOCET/ROXICET) 5-325 MG tablet  Every 4 hours PRN     10/21/17 2320    ondansetron (ZOFRAN ODT) 4 MG disintegrating tablet  Every 8 hours PRN     10/21/17 2320    tamsulosin (FLOMAX) 0.4 MG CAPS capsule  Daily     10/21/17 2320       [name], MD 10/21/17 2320    [name], MD 10/21/17 2356

## 2017-10-21 NOTE — Discharge Instructions (Addendum)
You were seen in the ER for flank pain, which is likely due to a 5 mm kidney stone on the left.  Call [facility] for follow-up.  Drink plenty of fluids.  IMPORTANT:  As part of your work-up, we have identified several incidental findings. While none of these are currently an emergency, it is VERY important for you to follow-up with your PCP and/or specialist as indicated. These findings were seen on your imaging today, and while many of these represent benign conditions, our radiologists have recommended that you follow-up as indicated to ensure there is no cancer or other underlying disease.  -- **An incidental finding of potential clinical significance has been found. Partially visualized 3.5 cm low-density structure along the right side of the pericardium. Differential diagnosis would include a pericardial cyst but this area is incompletely evaluated. Recommend a follow-up chest CT.**

## 2017-10-21 NOTE — ED Notes (Signed)
Pt tolerating fluids with no difficulty  

## 2017-10-21 NOTE — ED Notes (Signed)
[facility] sending over patient with LLQ pain and guarding

## 2017-10-21 NOTE — ED Notes (Addendum)
Patient informed about the need for a urinal sample. Patient given urinal to attempt to obtain sample.

## 2017-10-21 NOTE — ED Triage Notes (Signed)
Pt arrives from [facility] with c/o left sided abdominal pain. Onset for about 4 hours. Associated with diaphoresis and nausea. LBM this morning. No urinary symptoms

## 2018-02-21 ENCOUNTER — Other Ambulatory Visit: Payer: Self-pay | Admitting: Podiatry

## 2018-02-21 ENCOUNTER — Ambulatory Visit (INDEPENDENT_AMBULATORY_CARE_PROVIDER_SITE_OTHER): Payer: 59

## 2018-02-21 ENCOUNTER — Encounter: Payer: Self-pay | Admitting: Podiatry

## 2018-02-21 ENCOUNTER — Ambulatory Visit: Payer: 59 | Admitting: Podiatry

## 2018-02-21 ENCOUNTER — Ambulatory Visit: Payer: 59

## 2018-02-21 VITALS — BP 156/87 | HR 87 | Temp 97.5°F

## 2018-02-21 DIAGNOSIS — M21622 Bunionette of left foot: Secondary | ICD-10-CM

## 2018-02-21 DIAGNOSIS — M795 Residual foreign body in soft tissue: Secondary | ICD-10-CM | POA: Diagnosis not present

## 2018-02-21 DIAGNOSIS — S99922A Unspecified injury of left foot, initial encounter: Secondary | ICD-10-CM

## 2018-02-21 DIAGNOSIS — M205X2 Other deformities of toe(s) (acquired), left foot: Secondary | ICD-10-CM | POA: Diagnosis not present

## 2018-02-21 MED ORDER — CIPROFLOXACIN HCL 500 MG PO TABS: 500.0000 mg | ORAL_TABLET | Freq: Two times a day (BID) | ORAL | 0 refills | Status: DC
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
## 2018-02-21 NOTE — Patient Instructions (Signed)
Pre-Operative Instructions  Congratulations, you have decided to take an important step towards improving your quality of life.  You can be assured that the doctors and staff at [facility] will be with you every step of the way.  Here are some important things you should know:  1. Plan to be at the surgery center/hospital at least 1 (one) hour prior to your scheduled time, unless otherwise directed by the surgical center/hospital staff.  You must have a responsible adult accompany you, remain during the surgery and drive you home.  Make sure you have directions to the surgical center/hospital to ensure you arrive on time. 2. If you are having surgery at [facility] or [facility] hospitals, you will need a copy of your medical history and physical form from your family physician within one month prior to the date of surgery. We will give you a form for your primary physician to complete.  3. We make every effort to accommodate the date you request for surgery.  However, there are times where surgery dates or times have to be moved.  We will contact you as soon as possible if a change in schedule is required.   4. No aspirin/ibuprofen for one week before surgery.  If you are on aspirin, any non-steroidal anti-inflammatory medications (Mobic, Aleve, Ibuprofen) should not be taken seven (7) days prior to your surgery.  You make take Tylenol for pain prior to surgery.  5. Medications - If you are taking daily heart and blood pressure medications, seizure, reflux, allergy, asthma, anxiety, pain or diabetes medications, make sure you notify the surgery center/hospital before the day of surgery so they can tell you which medications you should take or avoid the day of surgery. 6. No food or drink after midnight the night before surgery unless directed otherwise by surgical center/hospital staff. 7. No alcoholic beverages 24-hours prior to surgery.  No smoking 24-hours prior or 24-hours after  surgery. 8. Wear loose pants or shorts. They should be loose enough to fit over bandages, boots, and casts. 9. Don't wear slip-on shoes. Sneakers are preferred. 10. Bring your boot with you to the surgery center/hospital.  Also bring crutches or a walker if your physician has prescribed it for you.  If you do not have this equipment, it will be provided for you after surgery. 11. If you have not been contacted by the surgery center/hospital by the day before your surgery, call to confirm the date and time of your surgery. 12. Leave-time from work may vary depending on the type of surgery you have.  Appropriate arrangements should be made prior to surgery with your employer. 13. Prescriptions will be provided immediately following surgery by your doctor.  Fill these as soon as possible after surgery and take the medication as directed. Pain medications will not be refilled on weekends and must be approved by the doctor. 14. Remove nail polish on the operative foot and avoid getting pedicures prior to surgery. 15. Wash the night before surgery.  The night before surgery wash the foot and leg well with water and the antibacterial soap provided. Be sure to pay special attention to beneath the toenails and in between the toes.  Wash for at least three (3) minutes. Rinse thoroughly with water and dry well with a towel.  Perform this wash unless told not to do so by your physician.  Enclosed: 1 Ice pack (please put in freezer the night before surgery)   1 Hibiclens skin cleaner     Pre-op instructions  If you have any questions regarding the instructions, please do not hesitate to call our office.  [place]: [address], [place], [place] [address] -- [phone]  [place]: [address] [place], [place] [address] -- [phone]  [place]: [address]  [place], [place] [address] -- [phone]  [place]: [address], [address], [place],  [address] -- [phone]  Website:  [email or web address] 

## 2018-03-02 ENCOUNTER — Telehealth: Payer: Self-pay | Admitting: *Deleted

## 2018-03-02 NOTE — Telephone Encounter (Signed)
"  This is [employer]  I'm calling in reference to my husband, who was in there Saturday with Dr. Amalia Hailey.  He said he was going to need surgery.  I was just calling to possibly see if he could get that surgery in December due to the fact that we have met our deductible for the year.  If you could, give me a call or him a call back."

## 2018-03-04 NOTE — Telephone Encounter (Signed)
I am returning your call.  I wanted to let you know Dr. Amalia Hailey had a cancellation for March 19, 2018.  Would you like to schedule surgery for that date?  "Let me call my husband right quick if you don't mind holding."  Okay, I will hold.  "He's not answering.  Is it possible for me to call you back with a response on tomorrow?"  Yes, that will be fine.  "I might call you back about it tonight.  Do you have a direct number that I can leave a message on?"  Yes, my phone number is [phone].  "Okay, I'll call you with a response."

## 2018-03-05 NOTE — Telephone Encounter (Signed)
"  We will take the open appointment for December 19."    I left her a message that I will schedule her husband's surgery for 03/19/18.

## 2018-03-19 ENCOUNTER — Other Ambulatory Visit: Payer: Self-pay | Admitting: Podiatry

## 2018-03-19 DIAGNOSIS — M2022 Hallux rigidus, left foot: Secondary | ICD-10-CM | POA: Diagnosis not present

## 2018-03-19 DIAGNOSIS — M21542 Acquired clubfoot, left foot: Secondary | ICD-10-CM | POA: Diagnosis not present

## 2018-03-19 MED ORDER — OXYCODONE-ACETAMINOPHEN 5-325 MG PO TABS: 1.0000 | ORAL_TABLET | Freq: Four times a day (QID) | ORAL | 0 refills | Status: DC | PRN

## 2018-03-19 NOTE — Progress Notes (Signed)
.  postop

## 2018-03-21 ENCOUNTER — Ambulatory Visit (INDEPENDENT_AMBULATORY_CARE_PROVIDER_SITE_OTHER): Payer: 59 | Admitting: Podiatry

## 2018-03-21 VITALS — BP 146/95 | HR 92 | Temp 99.2°F

## 2018-03-21 DIAGNOSIS — M205X2 Other deformities of toe(s) (acquired), left foot: Secondary | ICD-10-CM

## 2018-03-21 DIAGNOSIS — Z9889 Other specified postprocedural states: Secondary | ICD-10-CM

## 2018-03-21 DIAGNOSIS — M21622 Bunionette of left foot: Secondary | ICD-10-CM

## 2018-03-21 MED ORDER — IBUPROFEN 800 MG PO TABS: 800.0000 mg | ORAL_TABLET | Freq: Three times a day (TID) | ORAL | 0 refills | Status: DC | PRN

## 2018-03-26 ENCOUNTER — Ambulatory Visit (INDEPENDENT_AMBULATORY_CARE_PROVIDER_SITE_OTHER): Payer: Self-pay | Admitting: Podiatry

## 2018-03-26 ENCOUNTER — Ambulatory Visit (INDEPENDENT_AMBULATORY_CARE_PROVIDER_SITE_OTHER): Payer: 59

## 2018-03-26 VITALS — BP 153/105 | HR 102 | Temp 99.6°F

## 2018-03-26 DIAGNOSIS — L03119 Cellulitis of unspecified part of limb: Secondary | ICD-10-CM

## 2018-03-26 DIAGNOSIS — M21622 Bunionette of left foot: Secondary | ICD-10-CM

## 2018-03-26 DIAGNOSIS — M205X2 Other deformities of toe(s) (acquired), left foot: Secondary | ICD-10-CM

## 2018-03-26 DIAGNOSIS — Z9889 Other specified postprocedural states: Secondary | ICD-10-CM

## 2018-03-26 DIAGNOSIS — L02619 Cutaneous abscess of unspecified foot: Secondary | ICD-10-CM

## 2018-03-26 MED ORDER — DOXYCYCLINE HYCLATE 100 MG PO TABS: 100.0000 mg | ORAL_TABLET | Freq: Two times a day (BID) | ORAL | 0 refills | Status: DC

## 2018-03-26 NOTE — Progress Notes (Signed)
This patient presents the office today for evaluation of his left foot.  Surgery was performed on 03/19/2018 by Dr. Amalia Hailey.  Patient had cheilectomy performed first MPJ left foot and a Keller bunionectomy fifth MPJ left foot.  Patient states he immediately developed severe pain in his surgical foot and was seen this past Saturday by Dr. Amalia Hailey.  He presents the office today stating that he still has more pain now than he did prior to surgery.  He says that the pain is more tolerable by taking his pain medicine.  Patient also has a previous history of a foreign body penetration under the first MPJ left foot.  This was treated by Dr. Amalia Hailey with Cipro and then surgery was scheduled for surgical  correction of his left foot pain.  He now presents the office wearing the bandage that was applied at the surgical center and wearing his cam walker.  He is walking with crutches today.     Objective neurovascular status is intact.  No pain noted in the calf on his left leg.  Good wound correction noted at the surgical sites over the first MPJ and the fifth MPJ left foot.  There is generalized swelling redness and increased temperature noted over the dorsum of the left foot.  No streaking up into his leg is noted at this time.  He has generalized swelling and generalized limitation of motion through the MPJs left foot.    S/P foot surgery left foot.  Cellulitis left foot.  ROV.  X-rays taken reveal resection of the dorsal exostosis of the first metatarsal left foot.  Metatarsal osteotomy of the fifth metatarsal is intact with screw fixation.  Examination of the sesamoid apparatus do reveal cystic formation in the tibial and fibular sesamoids.  Clinical evaluation reveals an increased temperature and an infection that is proceeding in his left foot.  The surgical site was then re-bandaged with a dry sterile dressing and he was told to walk with his cam walker.  He was also prescribed a prescription for doxycycline 100 mg to  take 1 twice daily x10 days.  Patient is to return to the office in 1 week and be seen by Dr. Amalia Hailey for continued evaluation and treatment.   Gardiner Barefoot DPM

## 2018-03-28 NOTE — Progress Notes (Signed)
   Subjective:  Patient presents today status post cheilectomy and Tailor's bunionectomy left. DOS: 03/19/18. He reports throbbing pain of the hallux and shooting pain of the lateral foot. He reports significant associated swelling. Patient admits to excessive walking over the past 24 hours. He has been using the CAM boot as directed. Patient is here for further evaluation and treatment.    Past Medical History:  Diagnosis Date  . Abdominal pain   . ED (erectile dysfunction)   . GERD (gastroesophageal reflux disease)   . Gout   . Gout   . Hypertension   . Obesity, Class II, BMI 35-39.9, with comorbidity   . Renal disorder    kidney stone      Objective/Physical Exam Neurovascular status intact.  Skin incisions appear to be well coapted with sutures and staples intact. No sign of infectious process noted. No dehiscence. No active bleeding noted. Moderate edema noted to the surgical extremity.  Radiographic Exam:  Orthopedic hardware and osteotomies sites appear to be stable with routine healing.  Assessment: 1. s/p cheilectomy and Tailor's bunionectomy left. DOS: 03/19/18   Plan of Care:  1. Patient was evaluated. X-rays reviewed 2. Dressing changed. Keep clean, dry and intact for one week.  3. Continue minimal weightbearing in CAM boot.  4. Stressed importance of reducing activity and elevating foot.  5. Return to clinic at next scheduled post op appointment.    Edrick Kins, DPM [facility]  Dr. Edrick Kins, [place]                                        [place], [place] [address]                Office [phone]  Fax [phone]

## 2018-04-02 ENCOUNTER — Ambulatory Visit (INDEPENDENT_AMBULATORY_CARE_PROVIDER_SITE_OTHER): Payer: Self-pay | Admitting: Podiatry

## 2018-04-02 DIAGNOSIS — M205X2 Other deformities of toe(s) (acquired), left foot: Secondary | ICD-10-CM

## 2018-04-02 DIAGNOSIS — Z9889 Other specified postprocedural states: Secondary | ICD-10-CM

## 2018-04-02 DIAGNOSIS — M21622 Bunionette of left foot: Secondary | ICD-10-CM

## 2018-04-02 DIAGNOSIS — Z91199 Patient's noncompliance with other medical treatment and regimen due to unspecified reason: Secondary | ICD-10-CM

## 2018-04-02 DIAGNOSIS — Z9119 Patient's noncompliance with other medical treatment and regimen: Secondary | ICD-10-CM

## 2018-04-02 NOTE — Progress Notes (Signed)
  Subjective:  Patient ID: Aaron Moore, male    DOB: 1965-07-22,  MRN: [id number]  Chief Complaint  Patient presents with  . Routine Post Op     dos 12.19.2019 Cheilectomy Lt, Metatarsal Osteotomy 5th Lt " my foot feels ok" **patient walking barefoot into the clinic, no post op shoe -    DOS: 03/19/2018 Procedure: Cheilectomy left foot, fifth metatarsal osteotomy  53 y.o. male returns for post-op check.  States his foot feels okay would like to get his staples removed walks and barefoot without his postop shoe today  Review of Systems: Negative except as noted in the HPI. Denies N/V/F/Ch.  Past Medical History:  Diagnosis Date  . Abdominal pain   . ED (erectile dysfunction)   . GERD (gastroesophageal reflux disease)   . Gout   . Gout   . Hypertension   . Obesity, Class II, BMI 35-39.9, with comorbidity   . Renal disorder    kidney stone    Current Outpatient Medications:  .  allopurinol (ZYLOPRIM) 300 MG tablet, Take 300 mg by mouth daily.  , Disp: , Rfl:  .  ibuprofen (ADVIL,MOTRIN) 800 MG tablet, Take 1 tablet (800 mg total) by mouth every 8 (eight) hours as needed., Disp: 90 tablet, Rfl: 0 .  indomethacin (INDOCIN) 50 MG capsule, Take 50 mg by mouth 2 (two) times daily as needed. , Disp: , Rfl:  .  losartan (COZAAR) 100 MG tablet, Take 100 mg by mouth daily., Disp: , Rfl: 2  Social History   Tobacco Use  Smoking Status Never Smoker  Smokeless Tobacco Never Used    No Known Allergies Objective:  There were no vitals filed for this visit. There is no height or weight on file to calculate BMI. Constitutional Well developed. Well nourished.  Vascular Foot warm and well perfused. Capillary refill normal to all digits.   Neurologic Normal speech. Oriented to person, place, and time. Epicritic sensation to light touch grossly present bilaterally.  Dermatologic Skin healing well with intact staple material.  Orthopedic:  Slight tenderness to palpation noted about the  surgical sites.   Radiographs: None today Assessment:   1. Hallux limitus of left foot   2. Status post left foot surgery   3. Tailor's bunionette, left   4. History of noncompliance with medical treatment    Plan:  Patient was evaluated and treated and all questions answered.  S/p foot surgery left -Progressing as expected post-operatively. -XR: None today -WB Status: Weight-bear as tolerated in surgical shoe -Sutures: Every other staple removed today. -Medications: None refilled today -Foot redressed.  Return for [facility] Patient, put on [facility] schedule, keep current post op appt.

## 2018-04-08 ENCOUNTER — Ambulatory Visit (INDEPENDENT_AMBULATORY_CARE_PROVIDER_SITE_OTHER): Payer: Self-pay | Admitting: Podiatry

## 2018-04-08 ENCOUNTER — Ambulatory Visit (INDEPENDENT_AMBULATORY_CARE_PROVIDER_SITE_OTHER): Payer: 59

## 2018-04-08 DIAGNOSIS — M205X2 Other deformities of toe(s) (acquired), left foot: Secondary | ICD-10-CM

## 2018-04-08 DIAGNOSIS — M21622 Bunionette of left foot: Secondary | ICD-10-CM

## 2018-04-08 DIAGNOSIS — Z9889 Other specified postprocedural states: Secondary | ICD-10-CM

## 2018-04-12 NOTE — Progress Notes (Signed)
   Subjective:  Patient presents today status post cheilectomy and Tailor's bunionectomy left. DOS: 03/19/18. He reports continued pain on the lateral aspect of the foot. He denies any modifying factors. He has been using the CAM boot as directed. Patient is here for further evaluation and treatment.    Past Medical History:  Diagnosis Date  . Abdominal pain   . ED (erectile dysfunction)   . GERD (gastroesophageal reflux disease)   . Gout   . Gout   . Hypertension   . Obesity, Class II, BMI 35-39.9, with comorbidity   . Renal disorder    kidney stone      Objective/Physical Exam Neurovascular status intact.  Skin incisions appear to be well coapted with sutures and staples intact. No sign of infectious process noted. No dehiscence. No active bleeding noted. Moderate edema noted to the surgical extremity.  Radiographic Exam:  Orthopedic hardware and osteotomies sites appear to be stable with routine healing.  Assessment: 1. s/p cheilectomy and Tailor's bunionectomy left. DOS: 03/19/18   Plan of Care:  1. Patient was evaluated. X-rays reviewed 2. Transition out of CAM boot into good shoe gear.  3. Compression anklet dispensed.  4. Return to clinic in 4 weeks.    Edrick Kins, DPM [facility]  Dr. Edrick Kins, [place]                                        [place], [place] [address]                Office [phone]  Fax [phone]

## 2018-05-04 ENCOUNTER — Encounter: Payer: 59 | Admitting: Podiatry

## 2018-05-08 ENCOUNTER — Encounter: Payer: 59 | Admitting: Podiatry

## 2018-05-15 ENCOUNTER — Encounter: Payer: Self-pay | Admitting: Podiatry

## 2018-05-15 ENCOUNTER — Ambulatory Visit (INDEPENDENT_AMBULATORY_CARE_PROVIDER_SITE_OTHER): Payer: 59

## 2018-05-15 ENCOUNTER — Ambulatory Visit (INDEPENDENT_AMBULATORY_CARE_PROVIDER_SITE_OTHER): Payer: 59 | Admitting: Podiatry

## 2018-05-15 DIAGNOSIS — M205X2 Other deformities of toe(s) (acquired), left foot: Secondary | ICD-10-CM

## 2018-05-15 DIAGNOSIS — Z9889 Other specified postprocedural states: Secondary | ICD-10-CM

## 2018-05-19 ENCOUNTER — Telehealth: Payer: Self-pay | Admitting: Podiatry

## 2018-05-19 NOTE — Telephone Encounter (Signed)
My husband saw Dr. Amalia Hailey on Friday and prescription was supposed to have been called in[facility] [address] Please call me back at [phone] or at [phone].

## 2018-05-20 MED ORDER — DOXYCYCLINE HYCLATE 100 MG PO TABS: 100.0000 mg | ORAL_TABLET | Freq: Two times a day (BID) | ORAL | 0 refills | Status: DC

## 2018-05-20 NOTE — Telephone Encounter (Signed)
Patient has called again this morning. 2nd request

## 2018-05-20 NOTE — Telephone Encounter (Signed)
Per Dr Amalia Hailey verbal order, give Doxycycline    Script has been sent to pharmacy and patient's wife notified of rx .

## 2018-05-20 NOTE — Addendum Note (Signed)
Addended by: Graceann Congress D on: 05/20/2018 10:27 AM   Modules accepted: Orders

## 2018-06-02 NOTE — Progress Notes (Signed)
   HPI: Patient presents today status post cheilectomy and tailor's bunionectomy to the left foot.  Date of surgery 03/19/2018.  Patient continues to have pain with range of motion to the first MTPJ of the surgical foot.  Despite improvement of the range of motion he continues to have pain.  He says that he does feel a little bit better but he continues to have some swelling.  He is also having a hard time bending his big toe postoperatively.  He presents today for further treatment evaluation  Past Medical History:  Diagnosis Date  . Abdominal pain   . ED (erectile dysfunction)   . GERD (gastroesophageal reflux disease)   . Gout   . Gout   . Hypertension   . Obesity, Class II, BMI 35-39.9, with comorbidity   . Renal disorder    kidney stone     Physical Exam: General: The patient is alert and oriented x3 in no acute distress.  Dermatology: Skin is warm, dry and supple bilateral lower extremities. Negative for open lesions or macerations.  Vascular: Palpable pedal pulses bilaterally. No edema or erythema noted. Capillary refill within normal limits.  Neurological: Epicritic and protective threshold grossly intact bilaterally.   Musculoskeletal Exam: Range of motion within normal limits to all pedal and ankle joints bilateral. Muscle strength 5/5 in all groups bilateral.   Radiographic Exam:  Normal osseous mineralization. Joint spaces preserved. No fracture/dislocation/boney destruction.    Assessment: 1. S/p cheilectomy and tailor's bunionectomy left.  DOS: 03/19/2018 2.  DJD first MTPJ left  Plan of Care:  1. Patient evaluated. X-Rays reviewed.  2.  Today explained the patient that although cheilectomy was performed to remove all periarticular bone spurs and improve range of motion he continues to have symptoms associated with the arthritic changes within the joint.  I explained the patient would likely benefit from first MTPJ arthroplasty with an implant.  All possible  complications and details the procedure were explained.  No guarantees were expressed or implied.  All details of the surgery were explained. 3.  Today we do not initiate authorization for surgery.  Surgery will consist of first MTPJ arthroplasty with Silastic implant left.  (Please verify with booking sheet) 4.  Return to clinic 1 week postop      Edrick Kins, DPM [facility]  Dr. Edrick Kins, DPM    [address], [place] [address]                Office [phone]  Fax [phone]

## 2018-06-10 ENCOUNTER — Encounter: Payer: Self-pay | Admitting: Podiatry

## 2018-06-10 ENCOUNTER — Encounter: Payer: Self-pay | Admitting: *Deleted

## 2018-06-10 ENCOUNTER — Telehealth: Payer: Self-pay | Admitting: Podiatry

## 2018-06-10 NOTE — Telephone Encounter (Signed)
Pt states his dentist needs letter stating whether pt needs to be premedicated prior to his teeth cleaning. DOS 03/19/2018

## 2018-06-10 NOTE — Telephone Encounter (Signed)
Dr. Amalia Hailey states pt doesn't need to be premedicated prior to dental cleaning. Faxed letter with Dr. Amalia Hailey statement to Dr. Evelene Croon.

## 2018-06-17 ENCOUNTER — Encounter: Payer: Self-pay | Admitting: Podiatry

## 2018-06-17 ENCOUNTER — Telehealth: Payer: Self-pay | Admitting: *Deleted

## 2018-06-17 ENCOUNTER — Ambulatory Visit: Payer: 59 | Admitting: Podiatry

## 2018-06-17 ENCOUNTER — Other Ambulatory Visit: Payer: Self-pay

## 2018-06-17 DIAGNOSIS — M205X2 Other deformities of toe(s) (acquired), left foot: Secondary | ICD-10-CM

## 2018-06-17 DIAGNOSIS — M21622 Bunionette of left foot: Secondary | ICD-10-CM

## 2018-06-17 DIAGNOSIS — M7752 Other enthesopathy of left foot: Secondary | ICD-10-CM | POA: Diagnosis not present

## 2018-06-17 NOTE — Patient Instructions (Signed)
Pre-Operative Instructions  Congratulations, you have decided to take an important step towards improving your quality of life.  You can be assured that the doctors and staff at [facility] will be with you every step of the way.  Here are some important things you should know:  1. Plan to be at the surgery center/hospital at least 1 (one) hour prior to your scheduled time, unless otherwise directed by the surgical center/hospital staff.  You must have a responsible adult accompany you, remain during the surgery and drive you home.  Make sure you have directions to the surgical center/hospital to ensure you arrive on time. 2. If you are having surgery at [facility] or [facility] hospitals, you will need a copy of your medical history and physical form from your family physician within one month prior to the date of surgery. We will give you a form for your primary physician to complete.  3. We make every effort to accommodate the date you request for surgery.  However, there are times where surgery dates or times have to be moved.  We will contact you as soon as possible if a change in schedule is required.   4. No aspirin/ibuprofen for one week before surgery.  If you are on aspirin, any non-steroidal anti-inflammatory medications (Mobic, Aleve, Ibuprofen) should not be taken seven (7) days prior to your surgery.  You make take Tylenol for pain prior to surgery.  5. Medications - If you are taking daily heart and blood pressure medications, seizure, reflux, allergy, asthma, anxiety, pain or diabetes medications, make sure you notify the surgery center/hospital before the day of surgery so they can tell you which medications you should take or avoid the day of surgery. 6. No food or drink after midnight the night before surgery unless directed otherwise by surgical center/hospital staff. 7. No alcoholic beverages 24-hours prior to surgery.  No smoking 24-hours prior or 24-hours after  surgery. 8. Wear loose pants or shorts. They should be loose enough to fit over bandages, boots, and casts. 9. Don't wear slip-on shoes. Sneakers are preferred. 10. Bring your boot with you to the surgery center/hospital.  Also bring crutches or a walker if your physician has prescribed it for you.  If you do not have this equipment, it will be provided for you after surgery. 11. If you have not been contacted by the surgery center/hospital by the day before your surgery, call to confirm the date and time of your surgery. 12. Leave-time from work may vary depending on the type of surgery you have.  Appropriate arrangements should be made prior to surgery with your employer. 13. Prescriptions will be provided immediately following surgery by your doctor.  Fill these as soon as possible after surgery and take the medication as directed. Pain medications will not be refilled on weekends and must be approved by the doctor. 14. Remove nail polish on the operative foot and avoid getting pedicures prior to surgery. 15. Wash the night before surgery.  The night before surgery wash the foot and leg well with water and the antibacterial soap provided. Be sure to pay special attention to beneath the toenails and in between the toes.  Wash for at least three (3) minutes. Rinse thoroughly with water and dry well with a towel.  Perform this wash unless told not to do so by your physician.  Enclosed: 1 Ice pack (please put in freezer the night before surgery)   1 Hibiclens skin cleaner     Pre-op instructions  If you have any questions regarding the instructions, please do not hesitate to call our office.  [place]: [address], [place], [place] [address] -- [phone]  [place]: [address] [place], [place] [address] -- [phone]  [place]: [address]  [place], [place] [address] -- [phone]  [place]: [address], [address], [place], [place] [address] -- [phone]  Website:  [email or web address] 

## 2018-06-17 NOTE — Telephone Encounter (Signed)
I left Aaron Moore a message to call me back.  I asked him if he would like to have his surgery on Thursday, March 26.

## 2018-06-22 NOTE — Telephone Encounter (Signed)
"  You left me a message last week about rescheduling my surgery to this Thursday."  I did but now I have to reschedule all surgeries for this week and next week.  I was just informed of this information by the scheduler at the surgery center.  So, we're going to leave you as scheduled.  "So, I'm still scheduled for July 16, 2018?"  Yes, that is correct.  You're still scheduled for 07/16/2018.

## 2018-06-23 NOTE — Progress Notes (Signed)
   HPI: Patient presents today status post cheilectomy and tailor's bunionectomy to the left foot.  Date of surgery 03/19/2018.  Patient continues to have pain with range of motion to the first MTPJ of the left foot. He reports associated swelling of the area. He is interested in surgery to help alleviate his symptoms. Patient is here for further evaluation and treatment.   Past Medical History:  Diagnosis Date  . Abdominal pain   . ED (erectile dysfunction)   . GERD (gastroesophageal reflux disease)   . Gout   . Gout   . Hypertension   . Obesity, Class II, BMI 35-39.9, with comorbidity   . Renal disorder    kidney stone     Physical Exam: General: The patient is alert and oriented x3 in no acute distress.  Dermatology: Skin is warm, dry and supple bilateral lower extremities. Negative for open lesions or macerations.  Vascular: Palpable pedal pulses bilaterally. No edema or erythema noted. Capillary refill within normal limits.  Neurological: Epicritic and protective threshold grossly intact bilaterally.   Musculoskeletal Exam: Pain on palpation with limited range of motion noted to the first MPJ left foot.  Assessment: 1. Hallux limitus / DJD left 1st MPJ  Plan of Care:  1. Patient evaluated.  2. Today we discussed the conservative versus surgical management of the presenting pathology. The patient opts for surgical management. All possible complications and details of the procedure were explained. All patient questions were answered. No guarantees were expressed or implied. 3. Authorization for surgery was initiated today. Surgery will consist of great toe implant left.  4. Return to clinic one week post op.     Edrick Kins, DPM [facility]  Dr. Edrick Kins, DPM    [address], [place] [address]                Office [phone]  Fax [phone]

## 2018-07-06 ENCOUNTER — Telehealth: Payer: Self-pay | Admitting: *Deleted

## 2018-07-06 NOTE — Telephone Encounter (Signed)
"  This is [employer]  I was calling to see if Aaron Moore's surgery is still scheduled for the sixteenth.  If you could give me a call back."  I'm calling about my husband.  He's scheduled for surgery on next week.  I'm thinking we're going to have to reschedule it."  Yes, we do need to reschedule him.  I am calling people as we speak.  Dr. Rebekah Chesterfield next available date is going to be September 03, 2018.  "Wow, that far out!  I understand why though.  Can I speak to a nurse or Dr. Amalia Hailey?  He's been in a lot of pain with his foot.  Dr. Amalia Hailey didn't give him anything for the pain when he was there."  I'll transfer you to the nurse.  I transferred the call to [facility].  I rescheduled the surgery from July 16, 2018 to September 03, 2018 via the surgical center's [employer] Portal.

## 2018-07-07 ENCOUNTER — Telehealth: Payer: Self-pay | Admitting: *Deleted

## 2018-07-07 NOTE — Telephone Encounter (Signed)
[name] wife, Guerry Minors states pt's surgery has been postponed and he is having very sharp pain and swelling in his foot and would like a pain medication.

## 2018-07-07 NOTE — Telephone Encounter (Signed)
Left message instructing pt to go in to a wide, stiff bottom shoe until I called again tomorrow.

## 2018-07-14 ENCOUNTER — Ambulatory Visit: Payer: 59 | Admitting: Podiatry

## 2018-07-15 ENCOUNTER — Telehealth: Payer: Self-pay | Admitting: Podiatry

## 2018-07-15 NOTE — Telephone Encounter (Signed)
Patient still has not heard anything about medication.

## 2018-07-15 NOTE — Telephone Encounter (Signed)
Left message for pt to call to confirm if he wanted the ibuprofen 800mg .

## 2018-07-20 ENCOUNTER — Telehealth: Payer: Self-pay | Admitting: *Deleted

## 2018-07-20 NOTE — Telephone Encounter (Signed)
I left a voicemail for the patient to give me a call.  I told him we can reschedule his appointment to this Thursday if he would like.  I spoke to his wife and informed her we can reschedule his surgery from 09/03/2018 to 04/23 or 30/2020.  She said she would ask him and give me a call back.  She said she would leave me a voicemail message.

## 2018-07-21 NOTE — Telephone Encounter (Signed)
"  I was calling to let you know that Aaron Moore is still needing to check with his boss.  Could you tentative put him down for April 30 but also leave him him on the June schedule?  As soon as I have confirmation from him, I will call you back and give you a status update."

## 2018-07-22 NOTE — Telephone Encounter (Signed)
I attempted to call Aaron Moore regarding her husband's surgery.  I left her a message to call me back.
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
## 2018-08-07 ENCOUNTER — Telehealth: Payer: Self-pay | Admitting: *Deleted

## 2018-08-07 NOTE — Telephone Encounter (Signed)
DOS 08/20/2018, CPT CODE 00484 - Aaron Moore BUNION IMPLANT LEFT FOOT  [employer]: Effective - Term Dates 05/02/2018 - 04/01/2019   Individual In-Network (Calendar Year) Deductible $0.00 MET YTD  $750.00 remaining  $750.00 Plan Amt.   Out-of-Pocket $81.84 MET YTD  $2,918.16 remaining  $3,000.00 Plan Amt.  Professional Fees for Surgical and Medical Services 80% of eligible expenses after satisfying $750 deductible.         PRE-CERT IS REQUIRED: The notification/prior authorization case information was transmitted on 08/07/2018 at 11:43 AM CDT. The notification/prior authorization reference number is [id number]. Please print this page for your records.

## 2018-08-14 NOTE — Telephone Encounter (Signed)
COVERAGE STATUS - see pending authorization number below   1 [id number] Hallux rigidus correction with cheilecto more  Daylene Katayama,  [id number], In-Network,  [place],  [place], [place] [id number] Covered/Approved

## 2018-08-20 ENCOUNTER — Telehealth: Payer: Self-pay | Admitting: *Deleted

## 2018-08-20 ENCOUNTER — Other Ambulatory Visit: Payer: Self-pay | Admitting: Podiatry

## 2018-08-20 DIAGNOSIS — M2012 Hallux valgus (acquired), left foot: Secondary | ICD-10-CM | POA: Diagnosis not present

## 2018-08-20 MED ORDER — OXYCODONE-ACETAMINOPHEN 5-325 MG PO TABS: 1.0000 | ORAL_TABLET | Freq: Four times a day (QID) | ORAL | 0 refills | Status: DC | PRN

## 2018-08-20 NOTE — Telephone Encounter (Signed)
I informed pt's wife of Dr. Amalia Hailey statement.

## 2018-08-20 NOTE — Progress Notes (Signed)
.  postop

## 2018-08-20 NOTE — Telephone Encounter (Signed)
Left message informing Dr. Amalia Hailey of pt's MRN and the post op medications needed to [id number].

## 2018-08-20 NOTE — Telephone Encounter (Signed)
Rx sent for percocet.

## 2018-08-20 NOTE — Telephone Encounter (Signed)
[name] wife, Guerry Minors states she is at the pharmacy and the [facility].

## 2018-08-20 NOTE — Telephone Encounter (Signed)
Dr. Amalia Hailey called states he was on his way to the office and would [facility].

## 2018-08-21 ENCOUNTER — Telehealth: Payer: Self-pay | Admitting: Sports Medicine

## 2018-08-21 NOTE — Telephone Encounter (Signed)
Wife called stating that there was a lot of blood on her husbands dressing when she removed the CAM boot on today. Wife reports that the blood is dry with a few areas that are wet. I informed wife that some bleeding on the dressing can be normal after surgery. I instructed her on how to re-inforce the bandage and to monitor if the bleeding continues to call me back to meet for a dressing change. Wife expressed understanding. I also advised wife to make sure he is elevating and icing to help control pain, swelling, and bleeding. I advised wife also to handwash CAM boot liner that may have been soiled with blood. Wife expressed understanding and thanks for my call back. -Dr. Cannon Kettle

## 2018-08-26 ENCOUNTER — Encounter: Payer: Self-pay | Admitting: Podiatry

## 2018-08-26 ENCOUNTER — Ambulatory Visit (INDEPENDENT_AMBULATORY_CARE_PROVIDER_SITE_OTHER): Payer: Self-pay | Admitting: Podiatry

## 2018-08-26 ENCOUNTER — Other Ambulatory Visit: Payer: Self-pay

## 2018-08-26 ENCOUNTER — Ambulatory Visit (INDEPENDENT_AMBULATORY_CARE_PROVIDER_SITE_OTHER): Payer: 59

## 2018-08-26 VITALS — Temp 97.0°F

## 2018-08-26 DIAGNOSIS — M205X2 Other deformities of toe(s) (acquired), left foot: Secondary | ICD-10-CM | POA: Diagnosis not present

## 2018-08-26 DIAGNOSIS — M21622 Bunionette of left foot: Secondary | ICD-10-CM | POA: Diagnosis not present

## 2018-08-26 DIAGNOSIS — Z9889 Other specified postprocedural states: Secondary | ICD-10-CM

## 2018-08-26 MED ORDER — DOXYCYCLINE HYCLATE 100 MG PO TABS: 100.0000 mg | ORAL_TABLET | Freq: Two times a day (BID) | ORAL | 0 refills | Status: DC

## 2018-08-30 NOTE — Progress Notes (Signed)
   Subjective:  Patient presents today status post 1st MPJ implant right. DOS: 08/20/2018. He reports some intermittent pain. He describes this pain as "an electrical charge" in his toes. He has been using the CAM boot as directed. There are no modifying factors noted. Patient is here for further evaluation and treatment.    Past Medical History:  Diagnosis Date  . Abdominal pain   . ED (erectile dysfunction)   . GERD (gastroesophageal reflux disease)   . Gout   . Gout   . Hypertension   . Obesity, Class II, BMI 35-39.9, with comorbidity   . Renal disorder    kidney stone      Objective/Physical Exam Neurovascular status intact.  Skin incisions appear to be well coapted with sutures and staples intact. Mild erythema noted consistent with localized cellulitis. No dehiscence. No active bleeding noted. Moderate edema noted to the surgical extremity.  Radiographic Exam:  Orthopedic hardware and osteotomies sites appear to be stable with routine healing.  Assessment: 1. s/p 1st MPJ implant right. DOS: 08/20/2018   Plan of Care:  1. Patient was evaluated. X-rays reviewed 2. Dressing changed. Keep clean, dry and intact.  3. Prescription for Doxycycline 100 mg provided to patient.  4. Continue weightbearing in CAM boot.  5. Return to clinic in one week.    Edrick Kins, DPM [facility]  Dr. Edrick Kins, [place]                                        [place], [place] [address]                Office [phone]  Fax [phone]

## 2018-08-31 ENCOUNTER — Other Ambulatory Visit: Payer: Self-pay

## 2018-08-31 ENCOUNTER — Ambulatory Visit (INDEPENDENT_AMBULATORY_CARE_PROVIDER_SITE_OTHER): Payer: 59 | Admitting: Podiatry

## 2018-08-31 ENCOUNTER — Encounter: Payer: Self-pay | Admitting: Podiatry

## 2018-08-31 VITALS — Temp 97.5°F

## 2018-08-31 DIAGNOSIS — M205X2 Other deformities of toe(s) (acquired), left foot: Secondary | ICD-10-CM

## 2018-08-31 DIAGNOSIS — Z9889 Other specified postprocedural states: Secondary | ICD-10-CM

## 2018-09-03 NOTE — Progress Notes (Signed)
   Subjective:  Patient presents today status post 1st MPJ implant right. DOS: 08/20/2018. He reports moderate pain for the last two days. He denies any modifying factors. He has been taking Doxycycline as directed and using the post op shoe. Patient is here for further evaluation and treatment.    Past Medical History:  Diagnosis Date  . Abdominal pain   . ED (erectile dysfunction)   . GERD (gastroesophageal reflux disease)   . Gout   . Gout   . Hypertension   . Obesity, Class II, BMI 35-39.9, with comorbidity   . Renal disorder    kidney stone      Objective/Physical Exam Neurovascular status intact.  Skin incisions appear to be well coapted with sutures and staples intact. Mild erythema noted consistent with localized cellulitis. No dehiscence. No active bleeding noted. Moderate edema noted to the surgical extremity.  Assessment: 1. s/p 1st MPJ implant right. DOS: 08/20/2018   Plan of Care:  1. Patient was evaluated.  2. Sutures removed.  3. Finish prophylactic Doxycycline as prescribed.  4. Continue using post op shoe.  5. Return to clinic in 2 weeks for follow up X-Ray.   Edrick Kins, DPM [facility]  Dr. Edrick Kins, [place]                                        [place], [place] [address]                Office [phone]  Fax [phone]

## 2018-09-08 ENCOUNTER — Telehealth: Payer: Self-pay | Admitting: Podiatry

## 2018-09-08 NOTE — Telephone Encounter (Signed)
Wife called needs to know if pt needs antibiotics prior to teeth cleaning. Requested a clearance form be sent to dentist.

## 2018-09-08 NOTE — Telephone Encounter (Signed)
I spoke with pt's wife, Guerry Minors and informed that pt's surgery was in 07/2018 and he would need the dentist to premedicate him prior to dental work.

## 2018-09-21 ENCOUNTER — Ambulatory Visit (INDEPENDENT_AMBULATORY_CARE_PROVIDER_SITE_OTHER): Payer: 59 | Admitting: Podiatry

## 2018-09-21 ENCOUNTER — Ambulatory Visit (INDEPENDENT_AMBULATORY_CARE_PROVIDER_SITE_OTHER): Payer: 59

## 2018-09-21 ENCOUNTER — Other Ambulatory Visit: Payer: Self-pay

## 2018-09-21 VITALS — Temp 96.1°F

## 2018-09-21 DIAGNOSIS — M205X2 Other deformities of toe(s) (acquired), left foot: Secondary | ICD-10-CM

## 2018-09-21 DIAGNOSIS — M7752 Other enthesopathy of left foot: Secondary | ICD-10-CM

## 2018-09-21 DIAGNOSIS — Z9889 Other specified postprocedural states: Secondary | ICD-10-CM

## 2018-09-21 MED ORDER — METHYLPREDNISOLONE 4 MG PO TBPK: ORAL_TABLET | ORAL | 0 refills | Status: DC

## 2018-09-23 NOTE — Progress Notes (Signed)
   Subjective:  Patient presents today status post 1st MPJ implant left. DOS: 08/20/2018. He reports continued pain with associated swelling. He states there is a feeling of intense pressure on the plantar aspect of the foot. He has been using the compression anklet as directed. There are no modifying factors noted. Patient is here for further evaluation and treatment.    Past Medical History:  Diagnosis Date  . Abdominal pain   . ED (erectile dysfunction)   . GERD (gastroesophageal reflux disease)   . Gout   . Gout   . Hypertension   . Obesity, Class II, BMI 35-39.9, with comorbidity   . Renal disorder    kidney stone      Objective/Physical Exam Neurovascular status intact.  Skin incisions appear to be well coapted and healed. Edema noted to the left forefoot. No dehiscence. No active bleeding noted. Pain with palpation noted to the 2nd MPJ of the left foot.   Radiographic Exam:  Orthopedic hardware and osteotomies sites appear to be stable with routine healing.    Assessment: 1. s/p 1st MPJ implant left. DOS: 08/20/2018 2. 2nd MPJ capsulitis left 3. Edema left forefoot   Plan of Care:  1. Patient was evaluated. X-Rays reviewed.  2. Sutures removed.  3. Injection of 0.5 mLs Celestone Soluspan injected into the 2nd MPJ of the left foot.  4. Prescription for Medrol Dose Pak provided to patient. 5. Continue using compression anklet.  6. Return to clinic in 4 weeks.    Edrick Kins, DPM [facility]  Dr. Edrick Kins, [place]                                        [place], [place] [address]                Office [phone]  Fax [phone]

## 2018-10-01 ENCOUNTER — Telehealth: Payer: Self-pay

## 2018-10-01 NOTE — Telephone Encounter (Signed)
Patient's Wife Sarina Ser) called in stating patient's surgery foot is swelling still and looking bruised. Patient has taken all anti-biotics prescribed but still not feeling any better.   Called patient and spoke to wife, asked if there were any signs of open wound, bleeding or redness. Wife denied any. Wanted to know if she could sent pictures. Advised could sent pictures through MyChart to have Dr Amalia Hailey look at them on Monday. In the meantime if not feeling any better over the weekend to go to Urgent Care.

## 2018-10-06 ENCOUNTER — Encounter: Payer: Self-pay | Admitting: Podiatry

## 2018-10-14 ENCOUNTER — Telehealth: Payer: Self-pay | Admitting: *Deleted

## 2018-10-14 ENCOUNTER — Ambulatory Visit (INDEPENDENT_AMBULATORY_CARE_PROVIDER_SITE_OTHER): Payer: 59

## 2018-10-14 ENCOUNTER — Encounter: Payer: Self-pay | Admitting: Podiatry

## 2018-10-14 ENCOUNTER — Other Ambulatory Visit: Payer: Self-pay

## 2018-10-14 ENCOUNTER — Ambulatory Visit (INDEPENDENT_AMBULATORY_CARE_PROVIDER_SITE_OTHER): Payer: 59 | Admitting: Podiatry

## 2018-10-14 VITALS — Temp 97.2°F

## 2018-10-14 DIAGNOSIS — Z9889 Other specified postprocedural states: Secondary | ICD-10-CM

## 2018-10-14 DIAGNOSIS — M205X2 Other deformities of toe(s) (acquired), left foot: Secondary | ICD-10-CM | POA: Diagnosis not present

## 2018-10-14 DIAGNOSIS — M7752 Other enthesopathy of left foot: Secondary | ICD-10-CM

## 2018-10-14 MED ORDER — DICLOFENAC SODIUM 75 MG PO TBEC: 75.0000 mg | DELAYED_RELEASE_TABLET | Freq: Two times a day (BID) | ORAL | 1 refills | Status: DC

## 2018-10-14 NOTE — Progress Notes (Signed)
   Subjective:  Patient presents today status post 1st MPJ implant left. DOS: 08/20/2018.  Patient continues to have swelling and tenderness with increased pain now to the plantar aspect of the second MTPJ.  He says that he has found a pair of boots that have helped however he continues to have pain daily with increased swelling.  Past Medical History:  Diagnosis Date  . Abdominal pain   . ED (erectile dysfunction)   . GERD (gastroesophageal reflux disease)   . Gout   . Gout   . Hypertension   . Obesity, Class II, BMI 35-39.9, with comorbidity   . Renal disorder    kidney stone      Objective/Physical Exam Neurovascular status intact.  Skin incisions appear to be well coapted and healed. Edema noted to the left forefoot that does not appear to be resolving. No dehiscence. No active bleeding noted.  Large soft tissue mass noted to the dorsal aspect of the left great toe overlying the proximal phalanx. Pain with palpation noted to the 2nd MPJ of the left foot with a significant amount of swelling and edema noted plantarly.   Radiographic Exam:  Orthopedic hardware and osteotomies sites appear to be stable with routine healing.    Assessment: 1. s/p 1st MPJ implant left. DOS: 08/20/2018 2. 2nd MPJ capsulitis left 3. Edema left forefoot 4.  Possible abscess left forefoot   Plan of Care:  1. Patient was evaluated. X-Rays reviewed.  2.  Prescription for diclofenac 75 mg twice daily 3.  Continue wearing good supportive tennis shoes 4.  Due to the unimproved nature and continued swelling which is concerning for abscess were going to order a CT with contrast forefoot.  Office will contact the patient with results   Edrick Kins, DPM [facility]  Dr. Edrick Kins, [place]                                        [place], [place] [address]                Office [phone]  Fax [phone]

## 2018-10-14 NOTE — Telephone Encounter (Signed)
-----   Message from Edrick Kins, DPM sent at 10/14/2018  2:25 PM EDT ----- Regarding: CT with contrast left forefoot Please order CT with contrast left forefoot.  Diagnosis: Abscess left forefoot.  Implant left great toe with metallic grommets.   Thanks, Dr. Amalia Hailey

## 2018-10-14 NOTE — Telephone Encounter (Signed)
Orders to Gretta Arab, RN for pre-cert and faxed to [facility].

## 2018-11-03 ENCOUNTER — Telehealth: Payer: Self-pay | Admitting: *Deleted

## 2018-11-03 NOTE — Telephone Encounter (Signed)
[facility] - Jerrye Beavers states the CT of 11/05/2018 needs to be just with contrast, and she will change in Epic, and Dr. Amalia Hailey just needs to sign. I told Jerrye Beavers I would have Dr. Amalia Hailey sign.

## 2018-11-03 NOTE — Telephone Encounter (Signed)
Order signed.

## 2018-11-05 ENCOUNTER — Ambulatory Visit
Admission: RE | Admit: 2018-11-05 | Discharge: 2018-11-05 | Disposition: A | Discharge status: Home / Self Care | Payer: 59 | Source: Ambulatory Visit | Attending: Podiatry | Admitting: Podiatry

## 2018-11-05 ENCOUNTER — Other Ambulatory Visit: Payer: Self-pay

## 2018-11-05 DIAGNOSIS — M7752 Other enthesopathy of left foot: Secondary | ICD-10-CM

## 2018-11-05 DIAGNOSIS — Z9889 Other specified postprocedural states: Secondary | ICD-10-CM

## 2018-11-05 DIAGNOSIS — M205X2 Other deformities of toe(s) (acquired), left foot: Secondary | ICD-10-CM

## 2018-11-05 MED ORDER — IOPAMIDOL (ISOVUE-300) INJECTION 61%
125.0000 mL | Freq: Once | INTRAVENOUS | Status: AC | PRN
  Administered 2018-11-05: 125 mL via INTRAVENOUS

## 2018-11-10 ENCOUNTER — Telehealth: Payer: Self-pay

## 2018-11-10 ENCOUNTER — Encounter: Payer: Self-pay | Admitting: Podiatry

## 2018-11-10 NOTE — Telephone Encounter (Signed)
Called patient and left VM to call and schedule appointment with Dr Amalia Hailey to discuss MRI results.

## 2018-11-16 ENCOUNTER — Other Ambulatory Visit: Payer: Self-pay

## 2018-11-16 ENCOUNTER — Ambulatory Visit (INDEPENDENT_AMBULATORY_CARE_PROVIDER_SITE_OTHER): Payer: 59 | Admitting: Podiatry

## 2018-11-16 ENCOUNTER — Encounter: Payer: Self-pay | Admitting: Podiatry

## 2018-11-16 VITALS — Temp 97.1°F

## 2018-11-16 DIAGNOSIS — M2580 Other specified joint disorders, unspecified joint: Secondary | ICD-10-CM

## 2018-11-16 DIAGNOSIS — M7752 Other enthesopathy of left foot: Secondary | ICD-10-CM

## 2018-11-16 DIAGNOSIS — R600 Localized edema: Secondary | ICD-10-CM

## 2018-11-16 MED ORDER — METHYLPREDNISOLONE 4 MG PO TBPK: ORAL_TABLET | ORAL | 0 refills | Status: DC

## 2018-11-16 MED ORDER — DICLOFENAC SODIUM 75 MG PO TBEC: 75.0000 mg | DELAYED_RELEASE_TABLET | Freq: Two times a day (BID) | ORAL | 1 refills | Status: DC

## 2018-11-18 NOTE — Progress Notes (Signed)
   Subjective:  Patient presents today status post 1st MPJ implant left. DOS: 08/20/2018. He reports continued pain with ambulation. He reports associated swelling of the foot. He reports difficulty moving the toes. He has been taking Diclofenac for treatment. Patient had a CT scan done on 11/05/2018. Patient is here for further evaluation and treatment.   Past Medical History:  Diagnosis Date  . Abdominal pain   . ED (erectile dysfunction)   . GERD (gastroesophageal reflux disease)   . Gout   . Gout   . Hypertension   . Obesity, Class II, BMI 35-39.9, with comorbidity   . Renal disorder    kidney stone      Objective/Physical Exam Neurovascular status intact. Skin incisions appear to be well coapted and healed. Edema noted to the left forefoot that does not appear to be resolving. No dehiscence. No active bleeding noted. Large soft tissue mass noted to the dorsal aspect of the left great toe overlying the proximal phalanx. Pain with palpation noted to the 2nd MPJ of the left foot with a significant amount of swelling and edema noted plantarly.   CT Impression:  1. Soft tissue swelling and edema of the left forefoot without well-defined or drainable fluid collections. No soft tissue gas. 2. Postoperative changes to the first MTP joint and fifth metatarsal head without evidence of postoperative complication.  Assessment: 1. s/p 1st MPJ implant left. DOS: 08/20/2018 2. 2nd MPJ capsulitis left 3. Edema left forefoot 4. Fibular sesamoiditis left    Plan of Care:  1. Patient was evaluated. CT reviewed.  2. Injection of 0.5 mLs Celestone Soluspan injected into the fibular sesamoid left.  3. Prescription for Medrol Dose Pak provided to patient. Then continue taking Diclofenac.  4. Orders for physical therapy three times weekly for four weeks.  5. Return to clinic in 4 weeks.    Edrick Kins, DPM [facility]  Dr. Edrick Kins, [place]                                         [place], [place] [address]                Office [phone]  Fax [phone]

## 2018-12-14 ENCOUNTER — Ambulatory Visit: Payer: 59 | Admitting: Podiatry

## 2018-12-16 ENCOUNTER — Ambulatory Visit: Payer: 59 | Admitting: Podiatry

## 2018-12-30 ENCOUNTER — Ambulatory Visit: Payer: 59 | Admitting: Podiatry

## 2019-01-04 ENCOUNTER — Ambulatory Visit: Payer: 59 | Admitting: Podiatry

## 2019-01-06 ENCOUNTER — Encounter: Payer: Self-pay | Admitting: Podiatry

## 2019-01-06 ENCOUNTER — Ambulatory Visit (INDEPENDENT_AMBULATORY_CARE_PROVIDER_SITE_OTHER): Payer: 59 | Admitting: Podiatry

## 2019-01-06 ENCOUNTER — Ambulatory Visit: Payer: 59

## 2019-01-06 ENCOUNTER — Other Ambulatory Visit: Payer: Self-pay

## 2019-01-06 DIAGNOSIS — M2580 Other specified joint disorders, unspecified joint: Secondary | ICD-10-CM

## 2019-01-06 DIAGNOSIS — M7752 Other enthesopathy of left foot: Secondary | ICD-10-CM | POA: Diagnosis not present

## 2019-01-06 DIAGNOSIS — R600 Localized edema: Secondary | ICD-10-CM

## 2019-01-06 DIAGNOSIS — T8484XA Pain due to internal orthopedic prosthetic devices, implants and grafts, initial encounter: Secondary | ICD-10-CM

## 2019-01-06 NOTE — Patient Instructions (Signed)
Pre-Operative Instructions  Congratulations, you have decided to take an important step towards improving your quality of life.  You can be assured that the doctors and staff at [facility] will be with you every step of the way.  Here are some important things you should know:  1. Plan to be at the surgery center/hospital at least 1 (one) hour prior to your scheduled time, unless otherwise directed by the surgical center/hospital staff.  You must have a responsible adult accompany you, remain during the surgery and drive you home.  Make sure you have directions to the surgical center/hospital to ensure you arrive on time. 2. If you are having surgery at [facility] or [facility] hospitals, you will need a copy of your medical history and physical form from your family physician within one month prior to the date of surgery. We will give you a form for your primary physician to complete.  3. We make every effort to accommodate the date you request for surgery.  However, there are times where surgery dates or times have to be moved.  We will contact you as soon as possible if a change in schedule is required.   4. No aspirin/ibuprofen for one week before surgery.  If you are on aspirin, any non-steroidal anti-inflammatory medications (Mobic, Aleve, Ibuprofen) should not be taken seven (7) days prior to your surgery.  You make take Tylenol for pain prior to surgery.  5. Medications - If you are taking daily heart and blood pressure medications, seizure, reflux, allergy, asthma, anxiety, pain or diabetes medications, make sure you notify the surgery center/hospital before the day of surgery so they can tell you which medications you should take or avoid the day of surgery. 6. No food or drink after midnight the night before surgery unless directed otherwise by surgical center/hospital staff. 7. No alcoholic beverages 24-hours prior to surgery.  No smoking 24-hours prior or 24-hours after  surgery. 8. Wear loose pants or shorts. They should be loose enough to fit over bandages, boots, and casts. 9. Don't wear slip-on shoes. Sneakers are preferred. 10. Bring your boot with you to the surgery center/hospital.  Also bring crutches or a walker if your physician has prescribed it for you.  If you do not have this equipment, it will be provided for you after surgery. 11. If you have not been contacted by the surgery center/hospital by the day before your surgery, call to confirm the date and time of your surgery. 12. Leave-time from work may vary depending on the type of surgery you have.  Appropriate arrangements should be made prior to surgery with your employer. 13. Prescriptions will be provided immediately following surgery by your doctor.  Fill these as soon as possible after surgery and take the medication as directed. Pain medications will not be refilled on weekends and must be approved by the doctor. 14. Remove nail polish on the operative foot and avoid getting pedicures prior to surgery. 15. Wash the night before surgery.  The night before surgery wash the foot and leg well with water and the antibacterial soap provided. Be sure to pay special attention to beneath the toenails and in between the toes.  Wash for at least three (3) minutes. Rinse thoroughly with water and dry well with a towel.  Perform this wash unless told not to do so by your physician.  Enclosed: 1 Ice pack (please put in freezer the night before surgery)   1 Hibiclens skin cleaner     Pre-op instructions  If you have any questions regarding the instructions, please do not hesitate to call our office.  [place]: [address], , [place] [address] -- [phone]  [place]: [address] [place], [place] [address] -- [phone]  [place]: [address]  [place], [place] [address] -- [phone]   Website: [email or web address] 

## 2019-01-07 ENCOUNTER — Telehealth: Payer: Self-pay | Admitting: Podiatry

## 2019-01-07 NOTE — Telephone Encounter (Signed)
DOS: 01/21/2019 SURGICAL PROCEDURES: Hallux MPJ Fusion Lt, Removal Fixation Deep Kwire/Screw Lt CPT CODES: 32440, 20680 DX CODES: M77.52, Z48  [employer]  Effective Date: 05/02/2018  Notification or Prior Authorization is not required for the requested services  This [employer] plan does not currently require a prior authorization for these services. If you have general questions about the prior authorization requirements, please call us at [phone] or visit [email or web address] > Clinician Resources > Advance and Admission Notification Requirements. The number above acknowledges your notification. Please write this number down for future reference. Notification is not a guarantee of coverage or payment.  Decision ID [id number]  The number above acknowledges your inquiry and our response. Please write this number down and refer to it for future inquiries. Coverage and payment for an item or service is governed by the member's benefit plan document, and, if applicable, the provider's participation agreement with the Health Plan.

## 2019-01-13 NOTE — Progress Notes (Signed)
   Subjective:  Patient presents today status post 1st MPJ implant left. DOS: 08/20/2018.  Patient continues to have severe pain and tenderness to the surgical forefoot with swelling.  The swelling is not improved and it is now been almost 5 months postoperatively.  He continues to have pain and tenderness despite no significant findings on CT.  Multiple conservative modalities have been unsuccessful to alleviate the patient's symptoms he presents today for further treatment and evaluation  Past Medical History:  Diagnosis Date  . Abdominal pain   . ED (erectile dysfunction)   . GERD (gastroesophageal reflux disease)   . Gout   . Gout   . Hypertension   . Obesity, Class II, BMI 35-39.9, with comorbidity   . Renal disorder    kidney stone      Objective/Physical Exam Neurovascular status intact. Skin incisions appear to be well coapted and healed.  Significant amount of edema noted to the left forefoot that does not appear to be resolving. No dehiscence. No active bleeding noted.  Pain with palpation noted to the first MPJ of the left foot with a significant amount of swelling and edema noted plantarly across the entire forefoot  CT Impression 11/05/2018:  1. Soft tissue swelling and edema of the left forefoot without well-defined or drainable fluid collections. No soft tissue gas. 2. Postoperative changes to the first MTP joint and fifth metatarsal head without evidence of postoperative complication.  Assessment: 1. s/p 1st MPJ implant left. DOS: 08/20/2018 2.  First MPJ capsulitis left with lesser MTPJ capsulitis as well-metatarsalgia left 3. Edema left forefoot 4. Fibular sesamoiditis left    Plan of Care:  1. Patient was evaluated.  2.  Discontinue physical therapy as it is only contributing to the patient's pain 3.  The patient is now approximately 5 months postop with no improvement of his symptoms or swelling.  Today we discussed removing the silicone implant to the first MPJ  and doing a revisional arthrodesis.  I do believe that the patient swelling and pain and tenderness is perhaps coming from the first MPJ implant.  All possible complications and details of the procedure were explained.  No guarantees were expressed or implied.  All patient questions were answered. 4.  Authorization for surgery initiated today.  Surgery will consist of removal of first MPJ implant left.  Arthrodesis left first MTPJ. 5.  Return to clinic 1 week postop  Edrick Kins, DPM [facility]  Dr. Edrick Kins, [address] [place]                                        [place], [place] [address]                Office [phone]  Fax [phone]

## 2019-01-20 ENCOUNTER — Telehealth: Payer: Self-pay | Admitting: *Deleted

## 2019-01-20 NOTE — Telephone Encounter (Signed)
"  I am calling to see if my husband's surgery was authorized for tomorrow.  We haven't heard anything from the insurance company about it."  Authorization was nor required for his surgery.  "Okay, that's good.  That is all I needed to know.  Hopefully this will be his last one."

## 2019-01-21 ENCOUNTER — Encounter: Payer: Self-pay | Admitting: Podiatry

## 2019-01-21 ENCOUNTER — Other Ambulatory Visit: Payer: Self-pay | Admitting: Podiatry

## 2019-01-21 DIAGNOSIS — M205X2 Other deformities of toe(s) (acquired), left foot: Secondary | ICD-10-CM

## 2019-01-21 DIAGNOSIS — Z4889 Encounter for other specified surgical aftercare: Secondary | ICD-10-CM

## 2019-01-21 MED ORDER — OXYCODONE-ACETAMINOPHEN 5-325 MG PO TABS: 1.0000 | ORAL_TABLET | Freq: Four times a day (QID) | ORAL | 0 refills | Status: DC | PRN

## 2019-01-21 NOTE — Progress Notes (Signed)
.  postop

## 2019-01-27 ENCOUNTER — Other Ambulatory Visit: Payer: Self-pay

## 2019-01-27 ENCOUNTER — Ambulatory Visit (INDEPENDENT_AMBULATORY_CARE_PROVIDER_SITE_OTHER): Payer: 59 | Admitting: Podiatry

## 2019-01-27 ENCOUNTER — Ambulatory Visit (INDEPENDENT_AMBULATORY_CARE_PROVIDER_SITE_OTHER): Payer: 59

## 2019-01-27 VITALS — Temp 98.2°F

## 2019-01-27 DIAGNOSIS — M205X2 Other deformities of toe(s) (acquired), left foot: Secondary | ICD-10-CM

## 2019-01-27 DIAGNOSIS — T8484XA Pain due to internal orthopedic prosthetic devices, implants and grafts, initial encounter: Secondary | ICD-10-CM

## 2019-01-27 MED ORDER — DOXYCYCLINE HYCLATE 100 MG PO TABS: 100.0000 mg | ORAL_TABLET | Freq: Two times a day (BID) | ORAL | 0 refills | Status: DC

## 2019-01-31 NOTE — Progress Notes (Signed)
   Subjective:  Patient presents today status post removal of hardware and 1st MPJ arthrodesis left. DOS: 01/21/2019. He states he is doing well but is still experiencing some pain and swelling. He has been using the post op shoe as directed. There are no modifying factors noted. Patient is here for further evaluation and treatment.    Past Medical History:  Diagnosis Date  . Abdominal pain   . ED (erectile dysfunction)   . GERD (gastroesophageal reflux disease)   . Gout   . Gout   . Hypertension   . Obesity, Class II, BMI 35-39.9, with comorbidity   . Renal disorder    kidney stone      Objective/Physical Exam Neurovascular status intact.  Skin incisions appear to be well coapted with sutures and staples intact. No sign of infectious process noted. No dehiscence. No active bleeding noted. Moderate edema noted to the surgical extremity.  Radiographic Exam:  Orthopedic hardware and osteotomies sites appear to be stable with routine healing.  Assessment: 1. s/p ROH and 1st MPJ arthrodesis left. DOS: 01/21/2019   Plan of Care:  1. Patient was evaluated. X-rays reviewed 2. Dressing changed.  3. Unna boot applied.  4. Continue nonweightbearing in post op shoe.  5. Prescription for Doxycycline provided to patient for prophylaxis.  6. Return to clinic in one week.    Edrick Kins, DPM [facility]  Dr. Edrick Kins, [place]                                        [place], [place] [address]                Office [phone]  Fax [phone]

## 2019-02-03 ENCOUNTER — Ambulatory Visit (INDEPENDENT_AMBULATORY_CARE_PROVIDER_SITE_OTHER): Payer: Self-pay | Admitting: Podiatry

## 2019-02-03 ENCOUNTER — Other Ambulatory Visit: Payer: Self-pay

## 2019-02-03 ENCOUNTER — Encounter: Payer: Self-pay | Admitting: Podiatry

## 2019-02-03 DIAGNOSIS — R600 Localized edema: Secondary | ICD-10-CM

## 2019-02-03 DIAGNOSIS — M205X2 Other deformities of toe(s) (acquired), left foot: Secondary | ICD-10-CM

## 2019-02-03 DIAGNOSIS — T8484XA Pain due to internal orthopedic prosthetic devices, implants and grafts, initial encounter: Secondary | ICD-10-CM

## 2019-02-08 ENCOUNTER — Encounter: Payer: 59 | Admitting: Podiatry

## 2019-02-08 NOTE — Progress Notes (Signed)
   Subjective:  Patient presents today status post removal of hardware and 1st MPJ arthrodesis left. DOS: 01/21/2019. He reports some associated bleeding and swelling that began three days ago. He reports associated purulence. He has been using the post op shoe as directed. There are no modifying factors noted. Patient is here for further evaluation and treatment.    Past Medical History:  Diagnosis Date  . Abdominal pain   . ED (erectile dysfunction)   . GERD (gastroesophageal reflux disease)   . Gout   . Gout   . Hypertension   . Obesity, Class II, BMI 35-39.9, with comorbidity   . Renal disorder    kidney stone      Objective/Physical Exam Neurovascular status intact.  Skin incisions appear to be well coapted with sutures and staples intact. No sign of infectious process noted. No dehiscence. No active bleeding noted. Moderate edema noted to the surgical extremity.  Assessment: 1. s/p ROH and 1st MPJ arthrodesis left. DOS: 01/21/2019   Plan of Care:  1. Patient was evaluated.  2. Staples removed.  3. Unna boot applied.  4. Continue nonweightbearing in post op shoe.  5. Return to clinic in one week.    Edrick Kins, DPM [facility]  Dr. Edrick Kins, [place]                                        [place], [place] [address]                Office [phone]  Fax [phone]

## 2019-02-15 ENCOUNTER — Encounter: Payer: Self-pay | Admitting: Podiatry

## 2019-02-15 ENCOUNTER — Other Ambulatory Visit: Payer: Self-pay

## 2019-02-15 ENCOUNTER — Ambulatory Visit (INDEPENDENT_AMBULATORY_CARE_PROVIDER_SITE_OTHER): Payer: 59 | Admitting: Podiatry

## 2019-02-15 ENCOUNTER — Ambulatory Visit (INDEPENDENT_AMBULATORY_CARE_PROVIDER_SITE_OTHER): Payer: 59

## 2019-02-15 VITALS — Temp 98.1°F

## 2019-02-15 DIAGNOSIS — M205X2 Other deformities of toe(s) (acquired), left foot: Secondary | ICD-10-CM | POA: Diagnosis not present

## 2019-02-15 DIAGNOSIS — Z9889 Other specified postprocedural states: Secondary | ICD-10-CM

## 2019-02-15 DIAGNOSIS — T8484XA Pain due to internal orthopedic prosthetic devices, implants and grafts, initial encounter: Secondary | ICD-10-CM

## 2019-02-18 NOTE — Progress Notes (Signed)
   Subjective:  Patient presents today status post removal of hardware and 1st MPJ arthrodesis left. DOS: 01/21/2019. He states he is doing well overall. He reports some redness and swelling of the foot. He states the great toe rubs against the 2nd toe which causes discomfort. He has been using the post op shoe as directed. Patient is here for further evaluation and treatment.    Past Medical History:  Diagnosis Date  . Abdominal pain   . ED (erectile dysfunction)   . GERD (gastroesophageal reflux disease)   . Gout   . Gout   . Hypertension   . Obesity, Class II, BMI 35-39.9, with comorbidity   . Renal disorder    kidney stone      Objective/Physical Exam Neurovascular status intact.  Skin incisions appear to be well coapted. No sign of infectious process noted. No dehiscence. No active bleeding noted. Moderate edema noted to the surgical extremity.  Radiographic Exam:  Orthopedic hardware and osteotomies sites appear to be stable with routine healing.  Assessment: 1. s/p ROH and 1st MPJ arthrodesis left. DOS: 01/21/2019   Plan of Care:  1. Patient was evaluated. X-Rays reviewed.  2. Unna boot applied to be worn for one week.  3. Recommended wearing compression socks daily after unna boot.  4. Continue nonweightbearing in post op shoe.  5. Return to clinic in 4 weeks.    Edrick Kins, DPM [facility]  Dr. Edrick Kins, [place]                                        [place], [place] [address]                Office [phone]  Fax [phone]

## 2019-03-08 ENCOUNTER — Other Ambulatory Visit: Payer: Self-pay

## 2019-03-08 ENCOUNTER — Ambulatory Visit (INDEPENDENT_AMBULATORY_CARE_PROVIDER_SITE_OTHER): Payer: 59 | Admitting: Podiatry

## 2019-03-08 ENCOUNTER — Encounter: Payer: Self-pay | Admitting: Podiatry

## 2019-03-08 DIAGNOSIS — M205X2 Other deformities of toe(s) (acquired), left foot: Secondary | ICD-10-CM

## 2019-03-08 DIAGNOSIS — Z9889 Other specified postprocedural states: Secondary | ICD-10-CM

## 2019-03-09 NOTE — Progress Notes (Signed)
   Subjective:  Patient presents today status post removal of hardware and 1st MPJ arthrodesis left. DOS: 01/21/2019. He states he is doing well overall.  He continues to have some swelling to the surgical foot.  He also has numbness to his great toe.  He also states that when he wears closed toed shoes with confinement there is some sensitivity along the incision site.  He says that the compression ankle sleeve helps until it is removed.  Past Medical History:  Diagnosis Date  . Abdominal pain   . ED (erectile dysfunction)   . GERD (gastroesophageal reflux disease)   . Gout   . Gout   . Hypertension   . Obesity, Class II, BMI 35-39.9, with comorbidity   . Renal disorder    kidney stone      Objective/Physical Exam Neurovascular status intact.  Skin incisions appear to be well coapted. No sign of infectious process noted. No dehiscence. No active bleeding noted.  There continues to be moderate edema noted to the surgical forefoot.  Paresthesia noted with light touch to the left hallux, iatrogenically induced  Radiographic Exam:  Orthopedic hardware and osteotomies sites appear to be stable with routine healing.  Assessment: 1. s/p ROH and 1st MPJ arthrodesis left. DOS: 01/21/2019   Plan of Care:  1. Patient was evaluated. X-Rays reviewed.  2.  Continue compression ankle sleeve 3.  I explained to the patient that edema may continue for several months.  Continue the compression to help alleviate and mitigate the swelling throughout the day 4.  Prescription for below-knee compression socks 30-40 mmHg was provided for the patient today 5.  Prescription for physical therapy at [facility] 6.  Return to clinic in 6 weeks  Edrick Kins, DPM [facility]  Dr. Edrick Kins, [address]                                        [place], [place] [address]                Office [phone]  Fax [phone]

## 2019-03-23 ENCOUNTER — Telehealth: Payer: Self-pay | Admitting: Podiatry

## 2019-03-23 NOTE — Telephone Encounter (Signed)
Called and spoke with Ms. Aaron Moore and told her the pt would need to fill out and sign a release form. She stated she would have him come by today or tomorrow to fill it out and that he would pick up his medical records on Monday when he is here for his appointment.

## 2019-03-23 NOTE — Telephone Encounter (Signed)
I'm calling to see how we go about getting a complete copy of Myrle's medical records including a disc of his images before Monday, 29 March 2019.

## 2019-03-29 ENCOUNTER — Encounter: Payer: Self-pay | Admitting: Podiatry

## 2019-03-29 ENCOUNTER — Other Ambulatory Visit: Payer: Self-pay

## 2019-03-29 ENCOUNTER — Ambulatory Visit (INDEPENDENT_AMBULATORY_CARE_PROVIDER_SITE_OTHER): Payer: 59 | Admitting: Podiatry

## 2019-03-29 DIAGNOSIS — M205X2 Other deformities of toe(s) (acquired), left foot: Secondary | ICD-10-CM

## 2019-03-29 DIAGNOSIS — Z9889 Other specified postprocedural states: Secondary | ICD-10-CM

## 2019-04-01 ENCOUNTER — Other Ambulatory Visit: Payer: Self-pay | Admitting: Orthopaedic Surgery

## 2019-04-01 DIAGNOSIS — M79672 Pain in left foot: Secondary | ICD-10-CM

## 2019-04-04 NOTE — Progress Notes (Signed)
   Subjective:  Patient presents today status post removal of hardware and 1st MPJ arthrodesis left. DOS: 01/21/2019. He states he is doing well. He reports some continued swelling and mild pain. He has been using the compression socks which helps alleviate his symptoms. Walking worsens them. Patient is here for further evaluation and treatment.   Past Medical History:  Diagnosis Date  . Abdominal pain   . ED (erectile dysfunction)   . GERD (gastroesophageal reflux disease)   . Gout   . Gout   . Hypertension   . Obesity, Class II, BMI 35-39.9, with comorbidity   . Renal disorder    kidney stone      Objective/Physical Exam Neurovascular status intact.  Skin incisions appear to be well coapted. No sign of infectious process noted. No dehiscence. No active bleeding noted.  There continues to be moderate edema noted to the surgical forefoot.  Paresthesia noted with light touch to the left hallux, iatrogenically induced  Assessment: 1. s/p ROH and 1st MPJ arthrodesis left. DOS: 01/21/2019   Plan of Care:  1. Patient was evaluated.  2. Continue doing physical therapy twice weekly.  3. Continue using compression socks bilaterally.  4. Continue using DM shoes.  5. Return to clinic in 2 months.    Edrick Kins, DPM [facility]  Dr. Edrick Kins, [address]                                        [place], [place] [address]                Office [phone]  Fax [phone]

## 2019-04-06 ENCOUNTER — Other Ambulatory Visit: Payer: Self-pay

## 2019-04-06 ENCOUNTER — Ambulatory Visit
Admission: RE | Admit: 2019-04-06 | Discharge: 2019-04-06 | Disposition: A | Discharge status: Home / Self Care | Payer: 59 | Source: Ambulatory Visit | Attending: Orthopaedic Surgery | Admitting: Orthopaedic Surgery

## 2019-04-06 DIAGNOSIS — M79672 Pain in left foot: Secondary | ICD-10-CM

## 2019-04-19 ENCOUNTER — Encounter: Payer: 59 | Admitting: Podiatry

## 2019-05-07 ENCOUNTER — Other Ambulatory Visit: Payer: Self-pay | Admitting: Orthopaedic Surgery

## 2019-05-07 DIAGNOSIS — M79672 Pain in left foot: Secondary | ICD-10-CM

## 2019-05-11 ENCOUNTER — Ambulatory Visit
Admission: RE | Admit: 2019-05-11 | Discharge: 2019-05-11 | Disposition: A | Discharge status: Home / Self Care | Payer: 59 | Source: Ambulatory Visit | Attending: Orthopaedic Surgery | Admitting: Orthopaedic Surgery

## 2019-05-11 ENCOUNTER — Other Ambulatory Visit: Payer: Self-pay

## 2019-05-11 DIAGNOSIS — M79672 Pain in left foot: Secondary | ICD-10-CM

## 2019-05-24 ENCOUNTER — Other Ambulatory Visit: Payer: Self-pay | Admitting: Orthopaedic Surgery

## 2019-05-31 ENCOUNTER — Other Ambulatory Visit: Payer: Self-pay

## 2019-05-31 ENCOUNTER — Encounter (HOSPITAL_BASED_OUTPATIENT_CLINIC_OR_DEPARTMENT_OTHER): Payer: Self-pay | Admitting: Orthopaedic Surgery

## 2019-05-31 ENCOUNTER — Other Ambulatory Visit: Payer: Self-pay | Admitting: Orthopaedic Surgery

## 2019-05-31 ENCOUNTER — Other Ambulatory Visit (HOSPITAL_COMMUNITY)
Admission: RE | Admit: 2019-05-31 | Discharge: 2019-05-31 | Disposition: A | Discharge status: Home / Self Care | Payer: 59 | Source: Ambulatory Visit | Attending: Orthopaedic Surgery | Admitting: Orthopaedic Surgery

## 2019-05-31 ENCOUNTER — Encounter (HOSPITAL_BASED_OUTPATIENT_CLINIC_OR_DEPARTMENT_OTHER)
Admission: RE | Admit: 2019-05-31 | Discharge: 2019-05-31 | Disposition: A | Discharge status: Home / Self Care | Payer: 59 | Source: Ambulatory Visit | Attending: Orthopaedic Surgery | Admitting: Orthopaedic Surgery

## 2019-05-31 DIAGNOSIS — Z01812 Encounter for preprocedural laboratory examination: Secondary | ICD-10-CM | POA: Insufficient documentation

## 2019-05-31 DIAGNOSIS — Z20822 Contact with and (suspected) exposure to covid-19: Secondary | ICD-10-CM | POA: Diagnosis not present

## 2019-05-31 NOTE — Progress Notes (Signed)

## 2019-06-01 LAB — SARS CORONAVIRUS 2 (TAT 6-24 HRS): SARS Coronavirus 2: NEGATIVE

## 2019-06-03 ENCOUNTER — Other Ambulatory Visit: Payer: Self-pay

## 2019-06-03 ENCOUNTER — Encounter (HOSPITAL_BASED_OUTPATIENT_CLINIC_OR_DEPARTMENT_OTHER)
Admission: RE | Disposition: A | Discharge status: Home / Self Care | Payer: Self-pay | Source: Home / Self Care | Attending: Orthopaedic Surgery

## 2019-06-03 ENCOUNTER — Ambulatory Visit (HOSPITAL_BASED_OUTPATIENT_CLINIC_OR_DEPARTMENT_OTHER): Payer: 59 | Admitting: Certified Registered"

## 2019-06-03 ENCOUNTER — Encounter (HOSPITAL_BASED_OUTPATIENT_CLINIC_OR_DEPARTMENT_OTHER): Payer: Self-pay | Admitting: Orthopaedic Surgery

## 2019-06-03 ENCOUNTER — Ambulatory Visit (HOSPITAL_BASED_OUTPATIENT_CLINIC_OR_DEPARTMENT_OTHER)
Admission: RE | Admit: 2019-06-03 | Discharge: 2019-06-03 | Disposition: A | Discharge status: Home / Self Care | Payer: 59 | Attending: Orthopaedic Surgery | Admitting: Orthopaedic Surgery

## 2019-06-03 DIAGNOSIS — E669 Obesity, unspecified: Secondary | ICD-10-CM | POA: Diagnosis not present

## 2019-06-03 DIAGNOSIS — I10 Essential (primary) hypertension: Secondary | ICD-10-CM | POA: Diagnosis not present

## 2019-06-03 DIAGNOSIS — Z791 Long term (current) use of non-steroidal anti-inflammatories (NSAID): Secondary | ICD-10-CM | POA: Diagnosis not present

## 2019-06-03 DIAGNOSIS — Z79899 Other long term (current) drug therapy: Secondary | ICD-10-CM | POA: Insufficient documentation

## 2019-06-03 DIAGNOSIS — M869 Osteomyelitis, unspecified: Secondary | ICD-10-CM | POA: Diagnosis not present

## 2019-06-03 DIAGNOSIS — R2242 Localized swelling, mass and lump, left lower limb: Secondary | ICD-10-CM | POA: Diagnosis not present

## 2019-06-03 DIAGNOSIS — Y838 Other surgical procedures as the cause of abnormal reaction of the patient, or of later complication, without mention of misadventure at the time of the procedure: Secondary | ICD-10-CM | POA: Insufficient documentation

## 2019-06-03 DIAGNOSIS — M109 Gout, unspecified: Secondary | ICD-10-CM | POA: Diagnosis not present

## 2019-06-03 DIAGNOSIS — M960 Pseudarthrosis after fusion or arthrodesis: Secondary | ICD-10-CM | POA: Diagnosis present

## 2019-06-03 DIAGNOSIS — Z6838 Body mass index (BMI) 38.0-38.9, adult: Secondary | ICD-10-CM | POA: Diagnosis not present

## 2019-06-03 HISTORY — PX: MASS EXCISION: SHX2000

## 2019-06-03 HISTORY — PX: INCISION AND DRAINAGE OF WOUND: SHX1803

## 2019-06-03 HISTORY — DX: Unspecified osteoarthritis, unspecified site: M19.90

## 2019-06-03 HISTORY — DX: Pain due to internal orthopedic prosthetic devices, implants and grafts, initial encounter: T84.84XA

## 2019-06-03 HISTORY — PX: HARDWARE REMOVAL: SHX979

## 2019-06-03 SURGERY — REMOVAL, HARDWARE
Anesthesia: Regional | Site: Foot | Laterality: Left

## 2019-06-03 MED ORDER — FENTANYL CITRATE (PF) 100 MCG/2ML IJ SOLN
INTRAMUSCULAR | Status: AC
  Filled 2019-06-03: qty 2

## 2019-06-03 MED ORDER — HYDROMORPHONE HCL 1 MG/ML IJ SOLN
0.5000 mg | Freq: Once | INTRAMUSCULAR | Status: AC
  Administered 2019-06-03: 0.5 mg via INTRAVENOUS

## 2019-06-03 MED ORDER — DEXAMETHASONE SODIUM PHOSPHATE 10 MG/ML IJ SOLN
INTRAMUSCULAR | Status: DC | PRN
  Administered 2019-06-03: 4 mg via INTRAVENOUS

## 2019-06-03 MED ORDER — HYDROMORPHONE HCL 1 MG/ML IJ SOLN
INTRAMUSCULAR | Status: AC
  Filled 2019-06-03: qty 0.5

## 2019-06-03 MED ORDER — ACETAMINOPHEN 500 MG PO TABS
1000.0000 mg | ORAL_TABLET | Freq: Once | ORAL | Status: AC
  Administered 2019-06-03: 1000 mg via ORAL

## 2019-06-03 MED ORDER — LACTATED RINGERS IV SOLN: INTRAVENOUS | Status: DC

## 2019-06-03 MED ORDER — ACETAMINOPHEN 500 MG PO TABS
ORAL_TABLET | ORAL | Status: AC
  Filled 2019-06-03: qty 2

## 2019-06-03 MED ORDER — PROPOFOL 500 MG/50ML IV EMUL
INTRAVENOUS | Status: DC | PRN
  Administered 2019-06-03: 25 ug/kg/min via INTRAVENOUS

## 2019-06-03 MED ORDER — FENTANYL CITRATE (PF) 100 MCG/2ML IJ SOLN
INTRAMUSCULAR | Status: DC | PRN
  Administered 2019-06-03: 50 ug via INTRAVENOUS
  Administered 2019-06-03: 25 ug via INTRAVENOUS

## 2019-06-03 MED ORDER — CEFAZOLIN SODIUM-DEXTROSE 1-4 GM/50ML-% IV SOLN
INTRAVENOUS | Status: AC
  Filled 2019-06-03: qty 50

## 2019-06-03 MED ORDER — EPHEDRINE SULFATE 50 MG/ML IJ SOLN
INTRAMUSCULAR | Status: DC | PRN
  Administered 2019-06-03: 10 mg via INTRAVENOUS

## 2019-06-03 MED ORDER — PROPOFOL 10 MG/ML IV BOLUS
INTRAVENOUS | Status: DC | PRN
  Administered 2019-06-03: 200 mg via INTRAVENOUS

## 2019-06-03 MED ORDER — MIDAZOLAM HCL 2 MG/2ML IJ SOLN
INTRAMUSCULAR | Status: AC
  Filled 2019-06-03: qty 2

## 2019-06-03 MED ORDER — CEFAZOLIN SODIUM-DEXTROSE 2-4 GM/100ML-% IV SOLN
INTRAVENOUS | Status: AC
  Filled 2019-06-03: qty 100

## 2019-06-03 MED ORDER — CEFAZOLIN SODIUM-DEXTROSE 2-4 GM/100ML-% IV SOLN: 2.0000 g | INTRAVENOUS | Status: DC

## 2019-06-03 MED ORDER — POVIDONE-IODINE 10 % EX SWAB
2.0000 "application " | Freq: Once | CUTANEOUS | Status: AC
  Administered 2019-06-03: 2 via TOPICAL

## 2019-06-03 MED ORDER — OXYCODONE HCL 5 MG PO TABS: 5.0000 mg | ORAL_TABLET | ORAL | 0 refills | Status: AC | PRN

## 2019-06-03 MED ORDER — FENTANYL CITRATE (PF) 100 MCG/2ML IJ SOLN: 50.0000 ug | INTRAMUSCULAR | Status: DC | PRN

## 2019-06-03 MED ORDER — ONDANSETRON HCL 4 MG/2ML IJ SOLN
INTRAMUSCULAR | Status: DC | PRN
  Administered 2019-06-03: 4 mg via INTRAVENOUS

## 2019-06-03 MED ORDER — KETAMINE HCL 10 MG/ML IJ SOLN
INTRAMUSCULAR | Status: DC | PRN
  Administered 2019-06-03: 50 mg via INTRAVENOUS

## 2019-06-03 MED ORDER — DEXTROSE 5 % IV SOLN
3.0000 g | INTRAVENOUS | Status: AC
  Administered 2019-06-03: 3 g via INTRAVENOUS

## 2019-06-03 MED ORDER — MIDAZOLAM HCL 5 MG/5ML IJ SOLN
INTRAMUSCULAR | Status: DC | PRN
  Administered 2019-06-03: 2 mg via INTRAVENOUS

## 2019-06-03 MED ORDER — HYDROMORPHONE HCL 1 MG/ML IJ SOLN
0.5000 mg | INTRAMUSCULAR | Status: DC | PRN
  Administered 2019-06-03: 0.5 mg via INTRAVENOUS

## 2019-06-03 MED ORDER — FENTANYL CITRATE (PF) 100 MCG/2ML IJ SOLN
25.0000 ug | INTRAMUSCULAR | Status: DC | PRN
  Administered 2019-06-03 (×2): 50 ug via INTRAVENOUS

## 2019-06-03 MED ORDER — KETAMINE HCL 100 MG/ML IJ SOLN
INTRAMUSCULAR | Status: AC
  Filled 2019-06-03: qty 1

## 2019-06-03 MED ORDER — EPHEDRINE 5 MG/ML INJ
INTRAVENOUS | Status: AC
  Filled 2019-06-03: qty 10

## 2019-06-03 MED ORDER — MIDAZOLAM HCL 2 MG/2ML IJ SOLN: 1.0000 mg | INTRAMUSCULAR | Status: DC | PRN

## 2019-06-03 MED ORDER — LIDOCAINE HCL (CARDIAC) PF 100 MG/5ML IV SOSY
PREFILLED_SYRINGE | INTRAVENOUS | Status: DC | PRN
  Administered 2019-06-03: 60 mg via INTRAVENOUS

## 2019-06-03 MED ORDER — 0.9 % SODIUM CHLORIDE (POUR BTL) OPTIME
TOPICAL | Status: DC | PRN
  Administered 2019-06-03: 1000 mL

## 2019-06-03 SURGICAL SUPPLY — 63 items
BANDAGE ESMARK 6X9 LF (GAUZE/BANDAGES/DRESSINGS) ×1 IMPLANT
BENZOIN TINCTURE PRP APPL 2/3 (GAUZE/BANDAGES/DRESSINGS) IMPLANT
BLADE SURG 15 STRL LF DISP TIS (BLADE) ×5 IMPLANT
BLADE SURG 15 STRL SS (BLADE) ×15
BNDG ELASTIC 4X5.8 VLCR STR LF (GAUZE/BANDAGES/DRESSINGS) ×3 IMPLANT
BNDG ELASTIC 6X5.8 VLCR STR LF (GAUZE/BANDAGES/DRESSINGS) IMPLANT
BNDG ESMARK 6X9 LF (GAUZE/BANDAGES/DRESSINGS) ×3
BONE CEMENT GENTAMICIN (Cement) ×3 IMPLANT
BOWL SMART MIX CTS (DISPOSABLE) ×3 IMPLANT
CEMENT BONE GENTAMICIN 40 (Cement) ×1 IMPLANT
CHLORAPREP W/TINT 26 (MISCELLANEOUS) ×3 IMPLANT
CLOSURE WOUND 1/2 X4 (GAUZE/BANDAGES/DRESSINGS)
COVER BACK TABLE 60X90IN (DRAPES) ×3 IMPLANT
DRAPE C-ARM 42X72 X-RAY (DRAPES) IMPLANT
DRAPE EXTREMITY T 121X128X90 (DISPOSABLE) ×3 IMPLANT
DRAPE IMP U-DRAPE 54X76 (DRAPES) ×3 IMPLANT
DRAPE OEC MINIVIEW 54X84 (DRAPES) ×3 IMPLANT
DRAPE U-SHAPE 47X51 STRL (DRAPES) ×3 IMPLANT
DRSG PAD ABDOMINAL 8X10 ST (GAUZE/BANDAGES/DRESSINGS) IMPLANT
ELECT REM PT RETURN 9FT ADLT (ELECTROSURGICAL) ×3
ELECTRODE REM PT RTRN 9FT ADLT (ELECTROSURGICAL) ×1 IMPLANT
GAUZE SPONGE 4X4 12PLY STRL (GAUZE/BANDAGES/DRESSINGS) ×3 IMPLANT
GAUZE XEROFORM 1X8 LF (GAUZE/BANDAGES/DRESSINGS) ×3 IMPLANT
GLOVE BIOGEL M STRL SZ7.5 (GLOVE) ×3 IMPLANT
GLOVE BIOGEL PI IND STRL 8 (GLOVE) ×1 IMPLANT
GLOVE BIOGEL PI INDICATOR 8 (GLOVE) ×2
GOWN STRL REUS W/ TWL LRG LVL3 (GOWN DISPOSABLE) ×1 IMPLANT
GOWN STRL REUS W/ TWL XL LVL3 (GOWN DISPOSABLE) ×1 IMPLANT
GOWN STRL REUS W/TWL LRG LVL3 (GOWN DISPOSABLE) ×3
GOWN STRL REUS W/TWL XL LVL3 (GOWN DISPOSABLE) ×3
NDL SAFETY ECLIPSE 18X1.5 (NEEDLE) ×1 IMPLANT
NEEDLE HYPO 18GX1.5 SHARP (NEEDLE) ×3
NEEDLE HYPO 25X1 1.5 SAFETY (NEEDLE) IMPLANT
NS IRRIG 1000ML POUR BTL (IV SOLUTION) ×3 IMPLANT
PACK BASIN DAY SURGERY FS (CUSTOM PROCEDURE TRAY) ×3 IMPLANT
PAD CAST 4YDX4 CTTN HI CHSV (CAST SUPPLIES) ×1 IMPLANT
PADDING CAST COTTON 4X4 STRL (CAST SUPPLIES) ×3
PADDING CAST SYNTHETIC 4 (CAST SUPPLIES)
PADDING CAST SYNTHETIC 4X4 STR (CAST SUPPLIES) IMPLANT
PENCIL SMOKE EVACUATOR (MISCELLANEOUS) ×3 IMPLANT
SET IRRIG Y TYPE TUR BLADDER L (SET/KITS/TRAYS/PACK) IMPLANT
SHEET MEDIUM DRAPE 40X70 STRL (DRAPES) IMPLANT
SLEEVE SCD COMPRESS KNEE MED (MISCELLANEOUS) ×3 IMPLANT
SPLINT FAST PLASTER 5X30 (CAST SUPPLIES)
SPLINT FIBERGLASS 4X30 (CAST SUPPLIES) IMPLANT
SPLINT PLASTER CAST FAST 5X30 (CAST SUPPLIES) IMPLANT
SPONGE LAP 18X18 RF (DISPOSABLE) ×6 IMPLANT
STOCKINETTE 6  STRL (DRAPES) ×3
STOCKINETTE 6 STRL (DRAPES) ×1 IMPLANT
STRIP CLOSURE SKIN 1/2X4 (GAUZE/BANDAGES/DRESSINGS) IMPLANT
SUCTION FRAZIER HANDLE 10FR (MISCELLANEOUS) ×3
SUCTION TUBE FRAZIER 10FR DISP (MISCELLANEOUS) ×1 IMPLANT
SUT ETHILON 3 0 PS 1 (SUTURE) ×3 IMPLANT
SUT MNCRL AB 3-0 PS2 18 (SUTURE) ×3 IMPLANT
SUT PDS AB 2-0 CT2 27 (SUTURE) IMPLANT
SUT VIC AB 3-0 FS2 27 (SUTURE) IMPLANT
SYR 10ML LL (SYRINGE) ×3 IMPLANT
SYR BULB 3OZ (MISCELLANEOUS) ×3 IMPLANT
Stimulan Rapid Cure 10cc IMPLANT
TOWEL GREEN STERILE FF (TOWEL DISPOSABLE) ×6 IMPLANT
TUBE CONNECTING 20'X1/4 (TUBING) ×1
TUBE CONNECTING 20X1/4 (TUBING) ×2 IMPLANT
UNDERPAD 30X36 HEAVY ABSORB (UNDERPADS AND DIAPERS) ×3 IMPLANT

## 2019-06-03 NOTE — Discharge Instructions (Signed)
DR. [facility] POST-OP INSTRUCTIONS   Pain Management 1. The numbing medicine and your leg will last around 18 hours, take a dose of your pain medicine as soon as you feel it wearing off to avoid rebound pain. 2. Keep your foot elevated above heart level.  Make sure that your heel hangs free ('floats'). 3. Take all prescribed medication as directed. 4. If taking narcotic pain medication you may want to use an over-the-counter stool softener to avoid constipation. 5. You may take over-the-counter NSAIDs (ibuprofen, naproxen, etc.) as well as over-the-counter acetaminophen as directed on the packaging as a supplement for your pain and may also use it to wean away from the prescription medication.  Activity ? Heel WB in post operative shoe. ? Keep dressing in place  First Postoperative Visit 1. Your first postop visit will be at least 2 weeks after surgery.  This should be scheduled when you schedule surgery. 2. If you do not have a postoperative visit scheduled please call [phone] to schedule an appointment. 3. At the appointment your incision will be evaluated for suture removal, x-rays will be obtained if necessary.  General Instructions 1. Swelling is very common after foot and ankle surgery.  It often takes 3 months for the foot and ankle to begin to feel comfortable.  Some amount of swelling will persist for 6-12 months. 2. DO NOT change the dressing.  If there is a problem with the dressing (too tight, loose, gets wet, etc.) please contact Dr. Pollie Friar office. 3. DO NOT get the dressing wet.  For showers you can use an over-the-counter cast cover or wrap a washcloth around the top of your dressing and then cover it with a plastic bag and tape it to your leg. 4. DO NOT soak the incision (no tubs, pools, bath, etc.) until you have approval from Dr. Lucia Gaskins.  Contact Dr. Huel Cote office or go to Emergency Room if: 1. Temperature above 101 F. 2. Increasing pain that is  unresponsive to pain medication or elevation 3. Excessive redness or swelling in your foot 4. Dressing problems - excessive bloody drainage, looseness or tightness, or if dressing gets wet 5. Develop pain, swelling, warmth, or discoloration of your calf   No tylenol today until after 5:15pm.    Post Anesthesia Home Care Instructions  Activity: Get plenty of rest for the remainder of the day. A responsible individual must stay with you for 24 hours following the procedure.  For the next 24 hours, DO NOT: -Drive a car -Paediatric nurse -Drink alcoholic beverages -Take any medication unless instructed by your physician -Make any legal decisions or sign important papers.  Meals: Start with liquid foods such as gelatin or soup. Progress to regular foods as tolerated. Avoid greasy, spicy, heavy foods. If nausea and/or vomiting occur, drink only clear liquids until the nausea and/or vomiting subsides. Call your physician if vomiting continues.  Special Instructions/Symptoms: Your throat may feel dry or sore from the anesthesia or the breathing tube placed in your throat during surgery. If this causes discomfort, gargle with warm salt water. The discomfort should disappear within 24 hours.  If you had a scopolamine patch placed behind your ear for the management of post- operative nausea and/or vomiting:  1. The medication in the patch is effective for 72 hours, after which it should be removed.  Wrap patch in a tissue and discard in the trash. Wash hands thoroughly with soap and water. 2. You may remove the patch earlier  than 72 hours if you experience unpleasant side effects which may include dry mouth, dizziness or visual disturbances. 3. Avoid touching the patch. Wash your hands with soap and water after contact with the patch.  Call your surgeon if you experience:   1.  Fever over 101.0. 2.  Inability to urinate. 3.  Nausea and/or vomiting. 4.  Extreme swelling or bruising at the  surgical site. 5.  Continued bleeding from the incision. 6.  Increased pain, redness or drainage from the incision. 7.  Problems related to your pain medication. 8.  Any problems and/or concernsInformation for Discharge Teaching: EXPAREL (bupivacaine liposome injectable suspension)   Your surgeon or anesthesiologist gave you EXPAREL(bupivacaine) to help control your pain after surgery.   EXPAREL is a local anesthetic that provides pain relief by numbing the tissue around the surgical site.  EXPAREL is designed to release pain medication over time and can control pain for up to 72 hours.  Depending on how you respond to EXPAREL, you may require less pain medication during your recovery.  Possible side effects:  Temporary loss of sensation or ability to move in the area where bupivacaine was injected.  Nausea, vomiting, constipation  Rarely, numbness and tingling in your mouth or lips, lightheadedness, or anxiety may occur.  Call your doctor right away if you think you may be experiencing any of these sensations, or if you have other questions regarding possible side effects.  Follow all other discharge instructions given to you by your surgeon or nurse. Eat a healthy diet and drink plenty of water or other fluids.  If you return to the hospital for any reason within 96 hours following the administration of EXPAREL, it is important for health care providers to know that you have received this anesthetic. A teal colored band has been placed on your arm with the date, time and amount of EXPAREL you have received in order to alert and inform your health care providers. Please leave this armband in place for the full 96 hours following administration, and then you may remove the band.  Regional Anesthesia Blocks  1. Numbness or the inability to move the "blocked" extremity may last from 3-48 hours after placement. The length of time depends on the medication injected and your individual  response to the medication. If the numbness is not going away after 48 hours, call your surgeon.  2. The extremity that is blocked will need to be protected until the numbness is gone and the  Strength has returned. Because you cannot feel it, you will need to take extra care to avoid injury. Because it may be weak, you may have difficulty moving it or using it. You may not know what position it is in without looking at it while the block is in effect.  3. For blocks in the legs and feet, returning to weight bearing and walking needs to be done carefully. You will need to wait until the numbness is entirely gone and the strength has returned. You should be able to move your leg and foot normally before you try and bear weight or walk. You will need someone to be with you when you first try to ensure you do not fall and possibly risk injury.  4. Bruising and tenderness at the needle site are common side effects and will resolve in a few days.  5. Persistent numbness or new problems with movement should be communicated to the surgeon or the [facility] [phone] [facility] [phone]).

## 2019-06-03 NOTE — Anesthesia Preprocedure Evaluation (Addendum)
Anesthesia Evaluation  Patient identified by MRN, date of birth, ID band Patient awake    Reviewed: Allergy & Precautions, NPO status , Patient's Chart, lab work & pertinent test results  Airway Mallampati: III  TM Distance: >3 FB Neck ROM: Full  Mouth opening: Limited Mouth Opening  Dental no notable dental hx. (+) Teeth Intact, Dental Advisory Given   Pulmonary neg pulmonary ROS,    Pulmonary exam normal breath sounds clear to auscultation       Cardiovascular hypertension, Pt. on medications Normal cardiovascular exam Rhythm:Regular Rate:Normal     Neuro/Psych negative neurological ROS  negative psych ROS   GI/Hepatic Neg liver ROS, GERD  Controlled,  Endo/Other  Morbid obesityObese BMI 39  Renal/GU negative Renal ROS  negative genitourinary   Musculoskeletal  (+) Arthritis ,   Abdominal   Peds  Hematology negative hematology ROS (+)   Anesthesia Other Findings   Reproductive/Obstetrics                           Anesthesia Physical Anesthesia Plan  ASA: III  Anesthesia Plan: General   Post-op Pain Management:    Induction: Intravenous  PONV Risk Score and Plan: Midazolam, Dexamethasone and Ondansetron  Airway Management Planned: LMA  Additional Equipment:   Intra-op Plan:   Post-operative Plan: Extubation in OR  Informed Consent: I have reviewed the patients History and Physical, chart, labs and discussed the procedure including the risks, benefits and alternatives for the proposed anesthesia with the patient or authorized representative who has indicated his/her understanding and acceptance.     Dental advisory given  Plan Discussed with: CRNA  Anesthesia Plan Comments:        Anesthesia Quick Evaluation

## 2019-06-03 NOTE — H&P (Signed)
Aaron Moore is an 54 y.o. male.   Chief Complaint: Left foot swelling and nonunion HPI: Aaron Moore is a 54 year old male who stepped on a nail approximately 1 year ago.  He is underwent 3 podiatric surgeries after this including bone block arthrodesis of his first MTP joint.  He has had persistent swelling and discomfort ever since the initial surgery which was an I&D of presumed infection.  CT scan revealed a nonunion at the bone block arthrodesis site with concern for bony changes.  There is significant soft tissue edema.  On exam he had significant amount of induration and pain.  He is here today for hardware removal, bone block removal and antibiotic spacer placement.  We will also perform saucerization of his first metatarsal and send specimen to pathology and microbiology.  Patient denies any fevers or chills.  He has not been on antibiotics for the past couple of weeks.  Past Medical History:  Diagnosis Date  . Abdominal pain   . Arthritis    hands  . ED (erectile dysfunction)   . GERD (gastroesophageal reflux disease)   . Gout   . Gout   . Hypertension   . Obesity, Class II, BMI 35-39.9, with comorbidity   . Painful orthopaedic hardware ([place])    left foot  . Renal disorder    kidney stone    Past Surgical History:  Procedure Laterality Date  . CARPAL TUNNEL RELEASE  05/03/2011   Procedure: CARPAL TUNNEL RELEASE;  Surgeon: Wynonia Sours, MD;  Location: [facility];  Service: Orthopedics;  Laterality: Left;  . FOOT FRACTURE SURGERY Left    x3  . HERNIA REPAIR  02/06/2009   Lap supraumb & umb VWH repairs    Family History  Problem Relation Age of Onset  . Diabetes Mother   . Diabetes Father    Social History:  reports that he has never smoked. He has never used smokeless tobacco. He reports that he does not drink alcohol or use drugs.  Allergies: No Known Allergies  Medications Prior to Admission  Medication Sig Dispense Refill  . allopurinol (ZYLOPRIM) 100 MG  tablet     . amLODipine (NORVASC) 5 MG tablet     . ibuprofen (ADVIL,MOTRIN) 800 MG tablet Take 1 tablet (800 mg total) by mouth every 8 (eight) hours as needed. 90 tablet 0  . diclofenac (VOLTAREN) 75 MG EC tablet Take 1 tablet (75 mg total) by mouth 2 (two) times daily. 60 tablet 1  . indomethacin (INDOCIN) 50 MG capsule Take 50 mg by mouth 2 (two) times daily as needed.       No results found for this or any previous visit (from the past 48 hour(s)). No results found.  Review of Systems  Constitutional: Negative.   Eyes: Negative.   Respiratory: Negative.   Cardiovascular: Negative.   Gastrointestinal: Negative.   Genitourinary: Negative.   Musculoskeletal:       Left foot with swelling and discomfort.  Redness about prior incision.  Skin: Negative.   Neurological: Negative.   Psychiatric/Behavioral: Negative.     Blood pressure (!) 156/92, pulse 77, temperature 98.9 F (37.2 C), temperature source Oral, resp. rate 18, height 6\' 1"  (1.854 m), weight 133.8 kg, SpO2 100 %. Physical Exam  Constitutional: He appears well-developed.  HENT:  Head: Normocephalic.  Eyes: Conjunctivae are normal.  Cardiovascular: Normal rate.  Respiratory: Effort normal.  GI: Soft.  Musculoskeletal:     Cervical back: Neck supple.  Comments: Left foot is swollen.  He has prior surgical incision is well-healed.  There is indurated skin about the incision.  There is varus deviation of the second toe with pain.  No fluctuance noted.  Significant indurated skin.  Sensation grossly intact to light touch about the toes.  Neurological: He is alert.  Skin: Skin is warm.  Psychiatric: He has a normal mood and affect.     Assessment/Plan We will plan for hardware removal, saucerization first metatarsal and proximal phalanx as well as debulking of the indurated soft tissue.  The tissues will be sent for culture and for pathology specimen.  This is a staged procedure and he understands this.  He also  understands the risk benefits alternatives surgery which include but are not limited to wound healing complications, continued infection, need for further surgery, damage to surrounding structures and continued pain.  After weighing these risks he opted proceed with surgery.  Erle Crocker, MD 06/03/2019, 11:36 AM

## 2019-06-03 NOTE — Anesthesia Procedure Notes (Signed)
Procedure Name: LMA Insertion Date/Time: 06/03/2019 12:12 PM Performed by: Signe Colt, CRNA Pre-anesthesia Checklist: Patient identified, Emergency Drugs available, Suction available and Patient being monitored Patient Re-evaluated:Patient Re-evaluated prior to induction Oxygen Delivery Method: Circle system utilized Preoxygenation: Pre-oxygenation with 100% oxygen Induction Type: IV induction Ventilation: Mask ventilation without difficulty LMA: LMA inserted LMA Size: 4.0 Number of attempts: 1 Airway Equipment and Method: Bite block Placement Confirmation: positive ETCO2 Tube secured with: Tape Dental Injury: Teeth and Oropharynx as per pre-operative assessment

## 2019-06-03 NOTE — Progress Notes (Signed)
Assisted Dr. Lanetta Inch with left, ultrasound guided, popliteal, adductor canal block in PACU post-procedure. Side rails up, monitors on throughout procedure. See vital signs in flow sheet. Tolerated Procedure well.

## 2019-06-03 NOTE — Transfer of Care (Signed)
Immediate Anesthesia Transfer of Care Note  Patient: Court Deister  Procedure(s) Performed: HARDWARE REMOVAL (Left Foot) IRRIGATION AND DEBRIDEMENT WOUND (Left Foot)  Patient Location: PACU  Anesthesia Type:General  Level of Consciousness: sedated and patient cooperative  Airway & Oxygen Therapy: Patient Spontanous Breathing and Patient connected to face mask oxygen  Post-op Assessment: Report given to RN and Post -op Vital signs reviewed and stable  Post vital signs: Reviewed and stable  Last Vitals:  Vitals Value Taken Time  BP    Temp    Pulse 76 06/03/19 1347  Resp 13 06/03/19 1347  SpO2 98 % 06/03/19 1347  Vitals shown include unvalidated device data.  Last Pain:  Vitals:   06/03/19 1037  TempSrc: Oral  PainSc: 0-No pain      Patients Stated Pain Goal: 3 ([id number])  Complications: No apparent anesthesia complications

## 2019-06-03 NOTE — Op Note (Signed)
Aaron Moore male 54 y.o. 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  Loose hardware.  Thickened fibrous deep soft tissue.  IMPLANTS: Antibiotic eluding bone cement  INDICATIONS:54 y.o. male stepped on a nail over 1 year ago.  He developed a soft tissue infection underwent I&D by a podiatrist.  He went on to subsequent surgery by podiatrist including great toe replacement and bone block arthrodesis.  He had progressive swelling throughout the entire course of his treatment.  Patient had continued pain and was seen in my office.  There is concern for residual indolent infection and therefore CT scan was done to look for nonunion and possibility of bone edema on MRI scan.  He was found to have significant bone edema within the first metatarsal and nonunion of the bone block arthrodesis site.  Given the appearance of persistent infection he was indicated for hardware removal, irrigation debridement of his arthrodesis site and debulking of the hypertrophic fibrous scar with placement of an antibiotic spacer.  He understood the risk benefits alternatives surgery which include but not limited to wound healing complications, infection, need for further surgery, continued pain, possibility of amputation.  After weighing these risks he opted proceed with surgery.  PROCEDURE: Patient was identified the preoperative holding area.  The left foot was marked by  myself.  Consent was signed myself the patient.  Taken to the operative suite placed supine the operative table.  Left lower extremity was prepped and draped in usual sterile fashion after preoperative antibiotics were given and a bump and bone foam was used.  All bony prominences were well-padded.  Surgical timeout is performed.  4 inch Esmarch ankle tourniquet was placed.  We began by making a longitudinal incision overlying the old incision for the arthrodesis site.  This taken sharply down through skin and subcutaneous tissue.  There is significant mount of hypertrophic scar tissue medially and laterally about the hallux dorsally.  This was incised and bluntly dissected down to the plate.  Then knife was used to elevate the soft tissue off the plate.  Attempt was made to identify the dorsomedial cutaneous branch but this was not visualized within the scar tissue.  Then the plate was entirely identified and removed.  The screws were loose within the bone.  There was obvious motion at the attempted arthrodesis site with loose bone block component.  The bone block component was removed.  The soft tissue was removed off of the proximal phalanx and first metatarsal to inspect the arthrodesis site and look for signs of indolent infection.  Tissue and bone were sent for culture and pathology.  Then the screw holes were cleaned of any fibrous and nonbony material using a curette and rondure.  There was an approximate 1.5 cm gap between the proximal phalanx and the end of the first metatarsal.  There is a large area of deep hypertrophic fibrous scar on the lateral aspect of the first metatarsal.  Soft tissue dissection was performed over this area.  There was deep pocket of milky fluid that was identified  and removed.  The fibrous tissue was debulked and excised.  This tissue was sent for pathology and culture as well.  Then the bone ends an area of presumed osteomyelitis were opened using Soil scientist and rondure to  enter the central portion of the first metatarsal and proximal phalanx.  There was some bony loss at both areas.  Tissue from the bone was sent for culture and pathology.  Then the wound was copiously irrigated with normal saline.  We then mixed a batch of stimulant beads however during the mixing process the material hardened prior to it being able to be placed into the container and therefore the stimulant beads were aborted.  We then mixed up a batch of antibiotic eluding bone cement.  This was formed into a bone block spacer between the distal aspect of the first metatarsal and the proximal phalanx to allow for maintenance of length and positioning of the hallux.  The bone cement was allowed to fully solidified.  Cement ends were smoothed off.  The position of the hallux was acceptable and confirmed on fluoroscopy to be so.  Then the soft tissue overlying the bone and bone cement was closed with a 3-0 Monocryl stitch.  The deep tissue was closed with 3-0 Monocryl stitch in the skin was closed with a 3-0 nylon stitch.  A soft dressing including Xeroform, 4 x 4's, sterile she cotton and 4 inch Ace wrap were placed.  He was awakened from anesthesia and taken recovery in stable condition.  There were no complications.  He tolerated this well.  POST OPERATIVE INSTRUCTIONS: Heel weightbearing to left lower extremity in walking boot Keep dressing in place Follow-up in 2 weeks for suture removal.  No x-rays needed. We will follow-up on pathology and culture results.  TOURNIQUET TIME: 1 hour  BLOOD LOSS:  Minimal         DRAINS: none         SPECIMEN: none       COMPLICATIONS:  * No complications entered in OR log *         Disposition: PACU - hemodynamically stable.         Condition: stable

## 2019-06-07 ENCOUNTER — Encounter: Payer: Self-pay | Admitting: Anesthesiology

## 2019-06-07 LAB — SURGICAL PATHOLOGY

## 2019-06-07 MED ORDER — ROPIVACAINE HCL 5 MG/ML IJ SOLN
INTRAMUSCULAR | Status: DC | PRN
  Administered 2019-06-03: 30 mL via EPIDURAL
  Administered 2019-06-03: 15 mL via EPIDURAL

## 2019-06-07 MED ORDER — DEXAMETHASONE SODIUM PHOSPHATE 10 MG/ML IJ SOLN
INTRAMUSCULAR | Status: DC | PRN
  Administered 2019-06-03: 5 mg via INTRAVENOUS

## 2019-06-07 NOTE — Anesthesia Procedure Notes (Signed)
Anesthesia Regional Block: Adductor canal block   Pre-Anesthetic Checklist: ,, timeout performed, Correct Patient, Correct Site, Correct Laterality, Correct Procedure, Correct Position, site marked, Risks and benefits discussed,  Surgical consent,  Pre-op evaluation,  At surgeon's request and post-op pain management  Laterality: Left  Prep: Maximum Sterile Barrier Precautions used, chloraprep       Needles:  Injection technique: Single-shot  Needle Type: Echogenic Stimulator Needle     Needle Length: 9cm  Needle Gauge: 22     Additional Needles:   Procedures:,,,, ultrasound used (permanent image in chart),,,,  Narrative:  Start time: 06/03/2019 4:14 PM End time: 06/03/2019 4:18 PM Injection made incrementally with aspirations every 5 mL.  Performed by: Personally  Anesthesiologist: Freddrick March, MD  Additional Notes: Monitors applied. No increased pain on injection. No increased resistance to injection. Injection made in 5cc increments. Good needle visualization. Patient tolerated procedure well.

## 2019-06-07 NOTE — Anesthesia Postprocedure Evaluation (Signed)
Anesthesia Post Note  Patient: Aaron Moore  Procedure(s) Performed: HARDWARE REMOVAL (Left Foot) IRRIGATION AND DEBRIDEMENT WOUND WITH IMPLANTATION OF ANTIBIOTIC CEMENT SPACER (Left Foot) DEBULKING LEFT FOOT MASS (Left Foot)     Patient location during evaluation: PACU Anesthesia Type: General and Regional Level of consciousness: awake and alert Pain management: pain level controlled Vital Signs Assessment: post-procedure vital signs reviewed and stable Respiratory status: spontaneous breathing, nonlabored ventilation, respiratory function stable and patient connected to nasal cannula oxygen Cardiovascular status: blood pressure returned to baseline and stable Postop Assessment: no apparent nausea or vomiting Anesthetic complications: no    Last Vitals:  Vitals:   06/03/19 1630 06/03/19 1653  BP: 134/73 129/79  Pulse: 76 74  Resp: 12 18  Temp:  36.6 C  SpO2: 96% 95%    Last Pain:  Vitals:   06/03/19 1653  TempSrc: Oral  PainSc: 3                  Amenah Tucci L Adaline Trejos

## 2019-06-07 NOTE — Anesthesia Procedure Notes (Addendum)
Anesthesia Regional Block: Popliteal block   Pre-Anesthetic Checklist: ,, timeout performed, Correct Patient, Correct Site, Correct Laterality, Correct Procedure, Correct Position, site marked, Risks and benefits discussed,  Surgical consent,  Pre-op evaluation,  At surgeon's request and post-op pain management  Laterality: Left  Prep: Maximum Sterile Barrier Precautions used, chloraprep       Needles:  Injection technique: Single-shot  Needle Type: Echogenic Stimulator Needle     Needle Length: 9cm  Needle Gauge: 22     Additional Needles:   Procedures:,,,, ultrasound used (permanent image in chart),,,,  Narrative:  Start time: 06/03/2019 4:08 PM End time: 06/03/2019 4:14 PM Injection made incrementally with aspirations every 5 mL.  Performed by: Personally  Anesthesiologist: Freddrick March, MD  Additional Notes: Monitors applied. No increased pain on injection. No increased resistance to injection. Injection made in 5cc increments. Good needle visualization. Patient tolerated procedure well.

## 2019-06-08 LAB — AEROBIC/ANAEROBIC CULTURE W GRAM STAIN (SURGICAL/DEEP WOUND): Culture: NO GROWTH

## 2019-06-25 ENCOUNTER — Ambulatory Visit: Payer: 59 | Attending: Internal Medicine

## 2019-06-25 ENCOUNTER — Ambulatory Visit: Payer: 59

## 2019-06-25 DIAGNOSIS — Z23 Encounter for immunization: Secondary | ICD-10-CM

## 2019-06-25 NOTE — Progress Notes (Signed)
   Covid-19 Vaccination Clinic  Name:  Aaron Moore    MRN: [id number] DOB: 1965/06/17  06/25/2019  [name] was observed post Covid-19 immunization for 15 minutes without incident. He was provided with Vaccine Information Sheet and instruction to access the V-Safe system.   [name] was instructed to call 911 with any severe reactions post vaccine: Marland Kitchen Difficulty breathing  . Swelling of face and throat  . A fast heartbeat  . A bad rash all over body  . Dizziness and weakness   Immunizations Administered    Name Date Dose VIS Date Route   Pfizer COVID-19 Vaccine 06/25/2019  4:27 PM 0.3 mL 03/12/2019 Intramuscular   Manufacturer: [place]   Lot: [id number]   [place]: [id number]

## 2019-07-12 ENCOUNTER — Ambulatory Visit: Payer: 59

## 2019-07-21 ENCOUNTER — Ambulatory Visit: Payer: 59 | Attending: Internal Medicine

## 2019-07-21 DIAGNOSIS — Z23 Encounter for immunization: Secondary | ICD-10-CM

## 2019-07-21 NOTE — Progress Notes (Signed)
   Covid-19 Vaccination Clinic  Name:  Aaron Moore    MRN: [id number] DOB: September 24, 1965  07/21/2019  [name] was observed post Covid-19 immunization for 15 minutes without incident. He was provided with Vaccine Information Sheet and instruction to access the V-Safe system.   [name] was instructed to call 911 with any severe reactions post vaccine: Marland Kitchen Difficulty breathing  . Swelling of face and throat  . A fast heartbeat  . A bad rash all over body  . Dizziness and weakness   Immunizations Administered    Name Date Dose VIS Date Route   Pfizer COVID-19 Vaccine 07/21/2019  4:46 PM 0.3 mL 05/26/2018 Intramuscular   Manufacturer: [place]   Lot: [id number]   [place]: [id number]

## 2020-06-06 ENCOUNTER — Other Ambulatory Visit: Payer: Self-pay | Admitting: Family Medicine

## 2020-06-06 DIAGNOSIS — M25512 Pain in left shoulder: Secondary | ICD-10-CM

## 2020-06-24 ENCOUNTER — Ambulatory Visit
Admission: RE | Admit: 2020-06-24 | Discharge: 2020-06-24 | Disposition: A | Discharge status: Home / Self Care | Payer: 59 | Source: Ambulatory Visit | Attending: Family Medicine | Admitting: Family Medicine

## 2020-06-24 ENCOUNTER — Other Ambulatory Visit: Payer: Self-pay

## 2020-06-24 DIAGNOSIS — M25512 Pain in left shoulder: Secondary | ICD-10-CM

## 2021-08-25 IMAGING — CT CT FOOT*L* W/O CM
3 of 4 series · 12 of 33 positions shown, 14 images · non-contrast
Comparison: MRI of the left foot 04/06/2019. CT of the left foot
11/05/2018. Plain films of the left foot 02/15/2019.

CLINICAL DATA: Severe left foot pain and swelling. History of prior
surgery x3 over the past year. The patient also suffered a prior
injury when she stepped on a nail. Subsequent encounter.

EXAM:
CT OF THE LEFT FOOT WITHOUT CONTRAST
TECHNIQUE: Multidetector CT imaging of the left foot was performed according to
the standard protocol. Multiplanar CT image reconstructions were
also generated.

[Series 10: sagsoft tissue (person_name) · sagittal · 0.30mm/px · 5 of 54 slices shown, 6 images]
[im 18/54  bone]
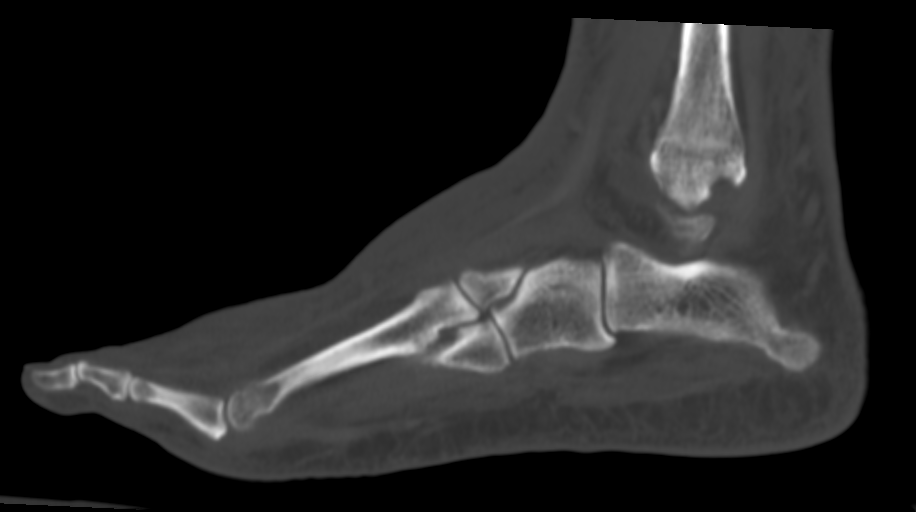
[im 23/54  bone]
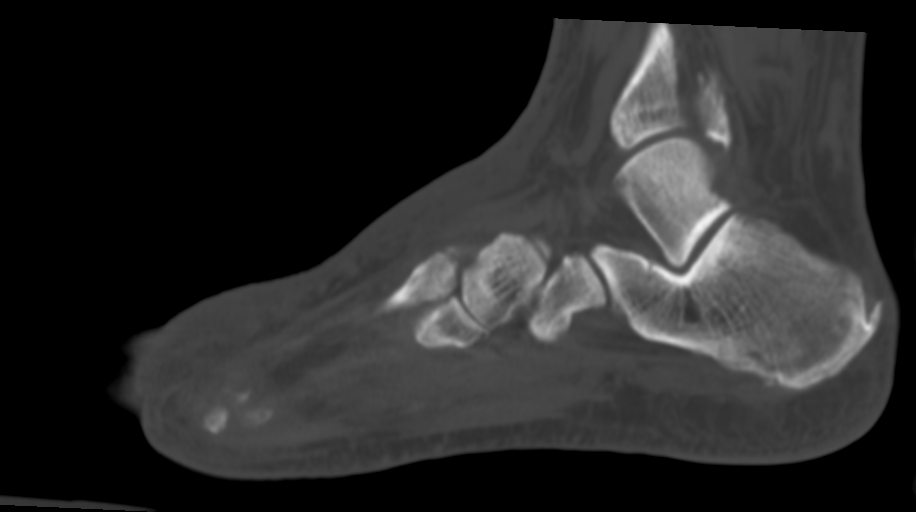
[im 27/54  soft-tissue]
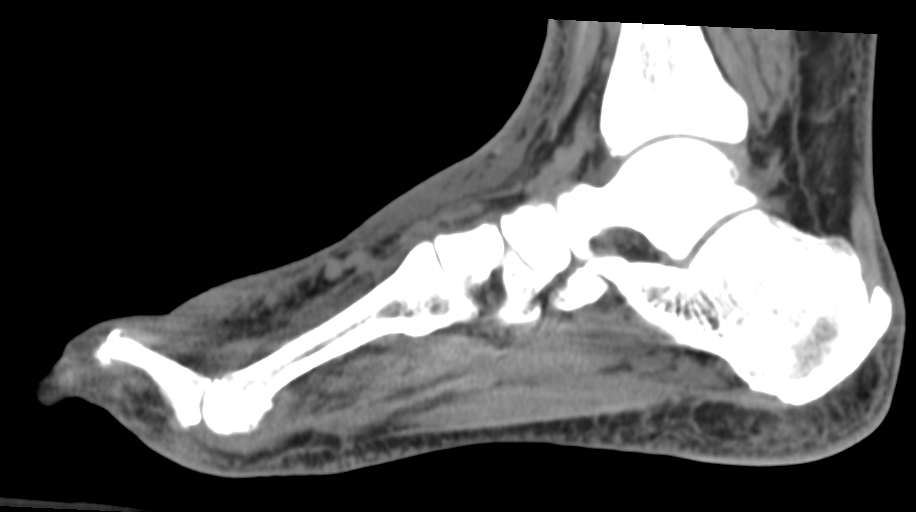
[im 27/54  bone]
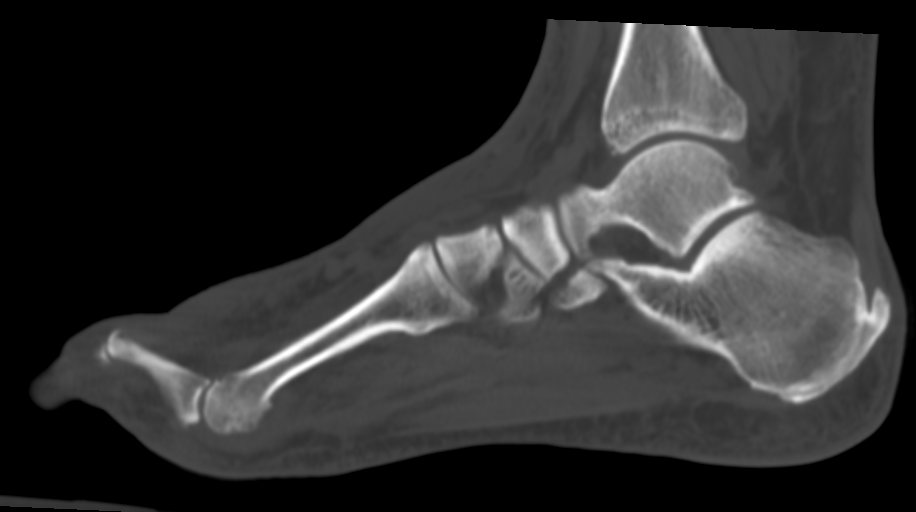
[im 31/54  bone]
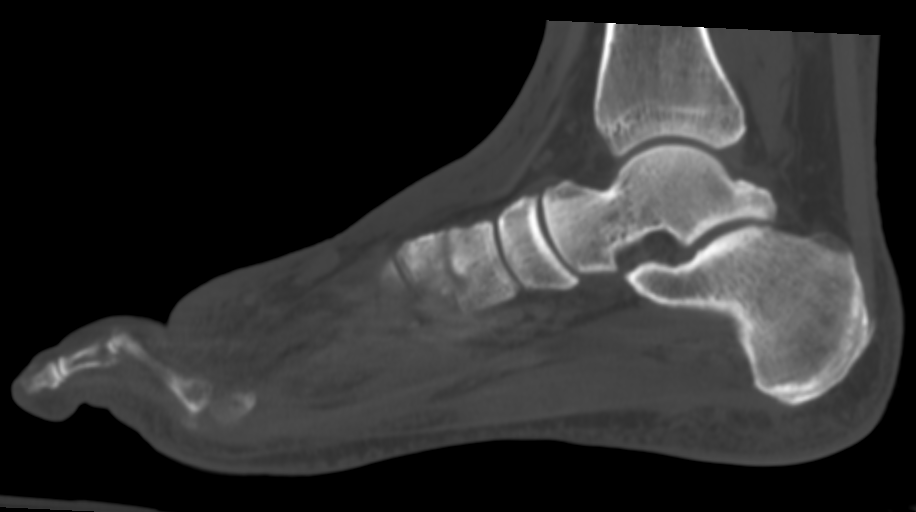
[im 36/54  bone]
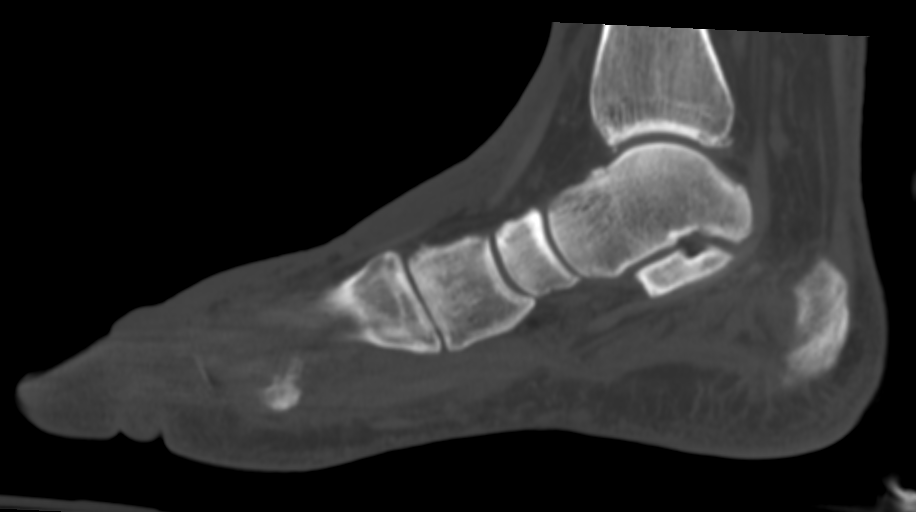

[Series 602: cor · axial · 0.58mm/px · z∈[+21,+112]mm · 4 of 74 slices shown, 5 images]
[im 13/74  soft-tissue]
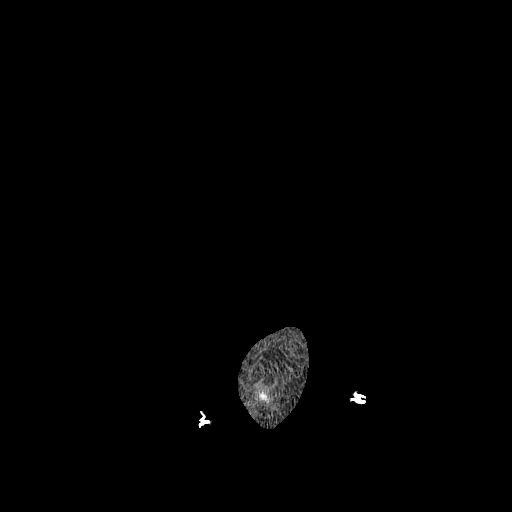
[im 13/74  bone]
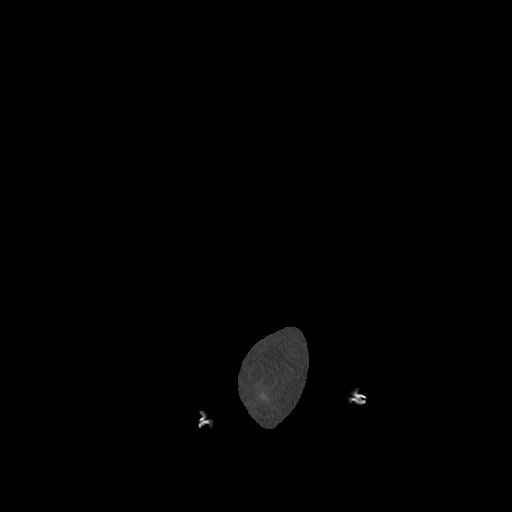
[im 25/74  bone]
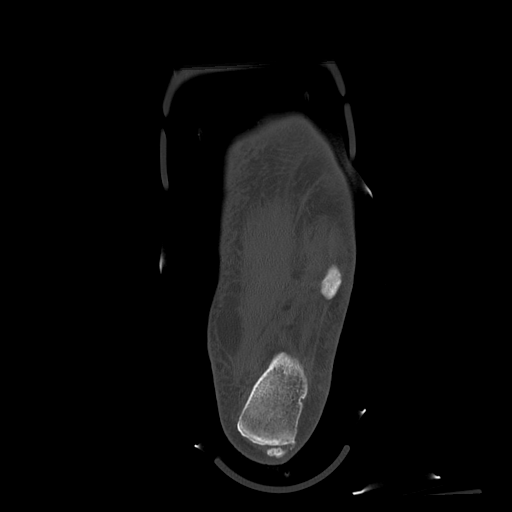
[im 49/74  bone]
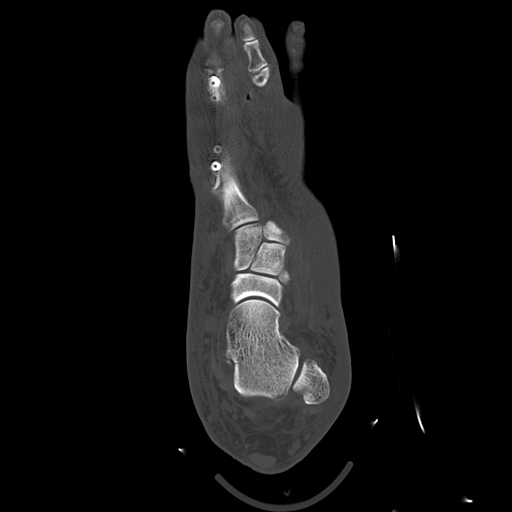
[im 61/74  bone]
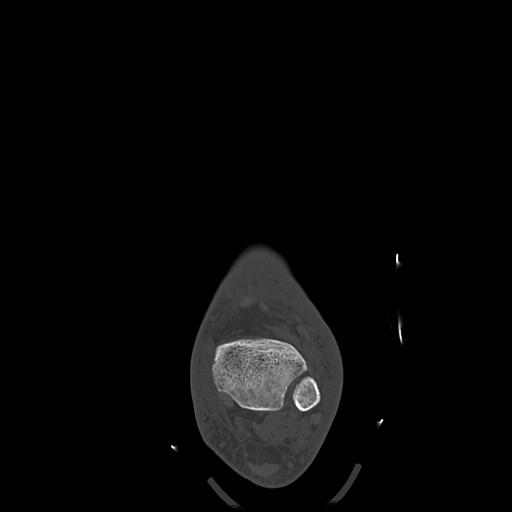

[Series 604: hindfoot · coronal · 0.58mm/px · 3 of 76 slices shown]
[im 30/76  bone]
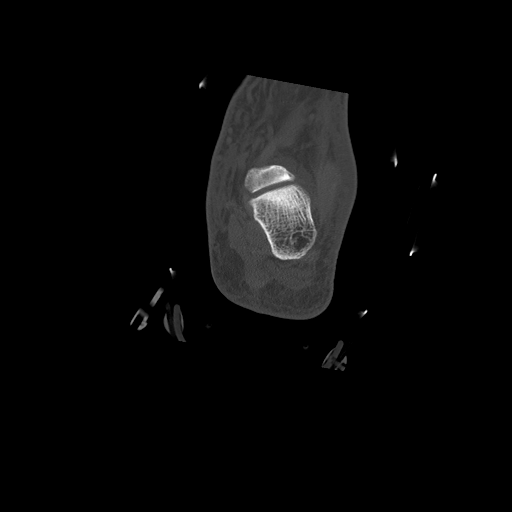
[im 41/76  bone]
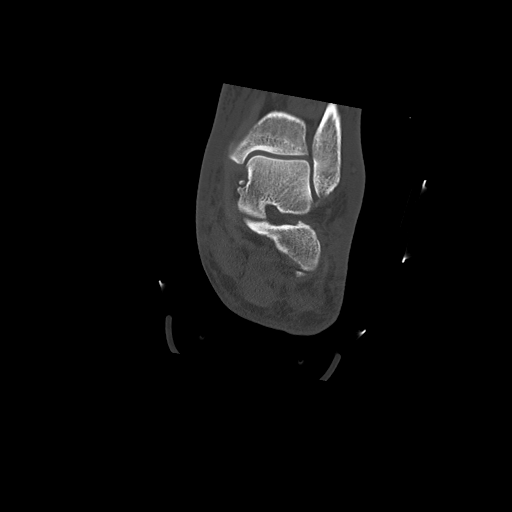
[im 52/76  bone]
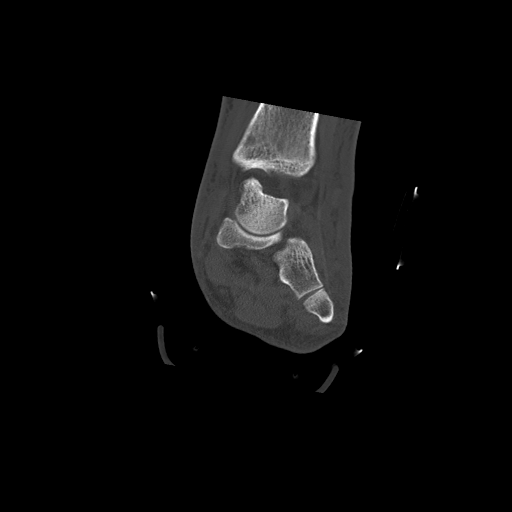

[12 of 33 positions shown; findings below may reference images not displayed]

FINDINGS: Bones/Joint/Cartilage

As seen on the prior MRI and plain films, the patient has undergone
fusion of the first MTP joint with dorsal plate and screws in place
and bone graft material at the first MTP joint. There is no bridging
bone. The second to last screw in the first metatarsal head
penetrates the bone and abuts the lateral sesamoid bone. There is
secondary erosive change in the sesamoid. There is lucency about all
the screws in the first metatarsal consistent with loosening or
infection. The distal most screw in the first metatarsal is not
anchored in bone. Additionally, none of the screws in the great toe
appear anchored in bone on sagittal imaging.

Healed osteotomy in the distal fifth metatarsal with a screw in
place is noted. There is spurring at the Achilles tendon insertion
on the calcaneus. Imaged bones are otherwise unremarkable.

Ligaments

Suboptimally assessed by CT.

Muscles and Tendons

Appear intact.  No intramuscular fluid collection.

Soft tissues

Intense edema is present about the dorsum scratch the intense edema
is seen in the dorsal soft tissues of the foot and diffusely about
the ankle. No focal fluid collection is identified.
IMPRESSION: Status post attempted fusion of the first MTP joint. There is no
bridging bone about the joint and extensive lucency is present about
virtually all of the screws in the plate consistent with loosening
or infection. Additionally, 1 of the screws in the first metatarsal
extends into the lateral sesamoid of the first MTP joint with
secondary erosion of the sesamoid. Both sesamoids are sclerotic
consistent with osteochondritis.

Intense subcutaneous edema about the ankle and foot could be due to
dependent change or cellulitis.

## 2021-09-12 ENCOUNTER — Other Ambulatory Visit (HOSPITAL_BASED_OUTPATIENT_CLINIC_OR_DEPARTMENT_OTHER): Payer: Self-pay

## 2021-09-12 ENCOUNTER — Encounter (HOSPITAL_BASED_OUTPATIENT_CLINIC_OR_DEPARTMENT_OTHER): Payer: Self-pay

## 2021-09-12 MED ORDER — WEGOVY 1 MG/0.5ML ~~LOC~~ SOAJ
SUBCUTANEOUS | 0 refills | Status: DC
  Filled 2021-09-12: qty 2, 28d supply, fill #0

## 2021-09-12 MED ORDER — WEGOVY 1.7 MG/0.75ML ~~LOC~~ SOAJ
SUBCUTANEOUS | 0 refills | Status: DC
  Filled 2021-09-12: qty 3, 28d supply, fill #0

## 2021-10-03 ENCOUNTER — Other Ambulatory Visit (HOSPITAL_BASED_OUTPATIENT_CLINIC_OR_DEPARTMENT_OTHER): Payer: Self-pay

## 2021-10-03 ENCOUNTER — Encounter (HOSPITAL_BASED_OUTPATIENT_CLINIC_OR_DEPARTMENT_OTHER): Payer: Self-pay

## 2021-10-03 MED ORDER — WEGOVY 1.7 MG/0.75ML ~~LOC~~ SOAJ
SUBCUTANEOUS | 5 refills | Status: DC
  Filled 2021-10-03 – 2021-10-16 (×2): qty 3, 28d supply, fill #0

## 2021-10-04 ENCOUNTER — Other Ambulatory Visit (HOSPITAL_BASED_OUTPATIENT_CLINIC_OR_DEPARTMENT_OTHER): Payer: Self-pay

## 2021-10-15 ENCOUNTER — Other Ambulatory Visit (HOSPITAL_BASED_OUTPATIENT_CLINIC_OR_DEPARTMENT_OTHER): Payer: Self-pay

## 2021-10-16 ENCOUNTER — Other Ambulatory Visit (HOSPITAL_BASED_OUTPATIENT_CLINIC_OR_DEPARTMENT_OTHER): Payer: Self-pay

## 2021-11-13 ENCOUNTER — Other Ambulatory Visit (HOSPITAL_BASED_OUTPATIENT_CLINIC_OR_DEPARTMENT_OTHER): Payer: Self-pay

## 2021-11-13 MED ORDER — WEGOVY 2.4 MG/0.75ML ~~LOC~~ SOAJ
SUBCUTANEOUS | 5 refills | Status: DC
  Filled 2021-11-13: qty 3, 28d supply, fill #0
  Filled 2021-12-09: qty 3, 28d supply, fill #1
  Filled 2022-01-08: qty 3, 28d supply, fill #2
  Filled 2022-02-28: qty 3, 28d supply, fill #3

## 2021-11-14 ENCOUNTER — Other Ambulatory Visit (HOSPITAL_BASED_OUTPATIENT_CLINIC_OR_DEPARTMENT_OTHER): Payer: Self-pay

## 2021-11-26 ENCOUNTER — Other Ambulatory Visit (HOSPITAL_COMMUNITY): Payer: Self-pay | Admitting: Urology

## 2021-11-26 DIAGNOSIS — C61 Malignant neoplasm of prostate: Secondary | ICD-10-CM

## 2021-12-06 ENCOUNTER — Encounter (HOSPITAL_COMMUNITY)
Admission: RE | Admit: 2021-12-06 | Discharge: 2021-12-06 | Disposition: A | Discharge status: Home / Self Care | Payer: 59 | Source: Ambulatory Visit | Attending: Urology | Admitting: Urology

## 2021-12-06 DIAGNOSIS — C61 Malignant neoplasm of prostate: Secondary | ICD-10-CM

## 2021-12-06 MED ORDER — PIFLIFOLASTAT F 18 (PYLARIFY) INJECTION
9.0000 | Freq: Once | INTRAVENOUS | Status: AC
  Administered 2021-12-06: 9.16 via INTRAVENOUS

## 2021-12-07 ENCOUNTER — Other Ambulatory Visit (HOSPITAL_COMMUNITY): Payer: 59

## 2021-12-10 ENCOUNTER — Other Ambulatory Visit (HOSPITAL_BASED_OUTPATIENT_CLINIC_OR_DEPARTMENT_OTHER): Payer: Self-pay

## 2022-01-08 ENCOUNTER — Other Ambulatory Visit (HOSPITAL_BASED_OUTPATIENT_CLINIC_OR_DEPARTMENT_OTHER): Payer: Self-pay

## 2022-01-22 ENCOUNTER — Other Ambulatory Visit: Payer: Self-pay | Admitting: Urology

## 2022-01-28 NOTE — Progress Notes (Addendum)
Anesthesia Review:  PCP: Janie Morning  Cardiologist : none  Chest x-ray : EKG : 02/04/22  Echo : Stress test: Cardiac Cath :  Activity level: can do a flight of stairs without difficutly  Sleep Study/ CPAP : none  Fasting Blood Sugar :      / Checks Blood Sugar -- times a day:   Blood Thinner/ Instructions /Last Dose: ASA / Instructions/ Last Dose :    Blood pressure at preop was 175/117 in left arm and in right arm was 182/114.  Repeat of blood pressure in left arm was 176/111.  PT states he took blood pressure med this am.  Denies any chest pain, shortness of breeath , dizziness or headache or blurred vision.  Instructed pt to contact PCP with blood pressure readings.  PT has copy with him.  PT voiced understanding.  [place], [facility] made aware.  Called and spoke with wife since pt was by himself at preop appt.  Made wife aware of blood pressure readings and what had been told to pt.  Wife voiced iunderstanding and stated she was placing a call to PCP herself in regards to blood pressure readings.    PT on Wegovy- Last dose on 01/27/22 per pt.   PT was 20 minutes late for preop appt.

## 2022-01-28 NOTE — Patient Instructions (Addendum)
SURGICAL WAITING ROOM VISITATION Patients having surgery or a procedure may have no more than 2 support people in the waiting area - these visitors may rotate.   Children under the age of 58 must have an adult with them who is not the patient. If the patient needs to stay at the hospital during part of their recovery, the visitor guidelines for inpatient rooms apply. Pre-op nurse will coordinate an appropriate time for 1 support person to accompany patient in pre-op.  This support person may not rotate.    Please refer to the [facility] website for the visitor guidelines for Inpatients (after your surgery is over and you are in a regular room).       Your procedure is scheduled on:  02/07/2022    Report to [facility] Main Entrance    Report to admitting at   0930AM   Call this number if you have problems the morning of surgery [phone]   Do not eat food :After Midnight.   After Midnight you may have the following liquids until ___ 0830___ AM      DAY OF SURGERY          Clear liquid diet day before surgery.             Magnesium citrate - one 8 ounce bottle at 12 noon day before surgery.            Fleets enema nite before surgery .   Water Non-Citrus Juices (without pulp, NO RED) Carbonated Beverages Black Coffee (NO MILK/CREAM OR CREAMERS, sugar ok)  Clear Tea (NO MILK/CREAM OR CREAMERS, sugar ok) regular and decaf                             Plain Jell-O (NO RED)                                           Fruit ices (not with fruit pulp, NO RED)                                     Popsicles (NO RED)                                                               Sports drinks like Gatorade (NO RED)                          If you have questions, please contact your surgeon's office.   FOLLOW BOWEL PREP AND ANY ADDITIONAL PRE OP INSTRUCTIONS YOU RECEIVED FROM YOUR SURGEON'S OFFICE!!!     Oral Hygiene is also important to reduce your risk of infection.                                     Remember - BRUSH YOUR TEETH THE MORNING OF SURGERY WITH YOUR REGULAR TOOTHPASTE   Do NOT smoke after Midnight   Take these medicines the morning  of surgery with A SIP OF WATER none    DO NOT TAKE ANY ORAL DIABETIC MEDICATIONS DAY OF YOUR SURGERY  Bring CPAP mask and tubing day of surgery.                              You may not have any metal on your body including hair pins, jewelry, and body piercing             Do not wear make-up, lotions, powders, perfumes/cologne, or deodorant  Do not wear nail polish including gel and S&S, artificial/acrylic nails, or any other type of covering on natural nails including finger and toenails. If you have artificial nails, gel coating, etc. that needs to be removed by a nail salon please have this removed prior to surgery or surgery may need to be canceled/ delayed if the surgeon/ anesthesia feels like they are unable to be safely monitored.   Do not shave  48 hours prior to surgery.               Men may shave face and neck.   Do not bring valuables to the hospital. [place].   Contacts, dentures or bridgework may not be worn into surgery.   Bring small overnight bag day of surgery.   DO NOT [place]. PHARMACY WILL DISPENSE MEDICATIONS LISTED ON YOUR MEDICATION LIST TO YOU DURING YOUR ADMISSION [place]!    Patients discharged on the day of surgery will not be allowed to drive home.  Someone NEEDS to stay with you for the first 24 hours after anesthesia.   Special Instructions: Bring a copy of your healthcare power of attorney and living will documents the day of surgery if you haven't scanned them before.              Please read over the following fact sheets you were given: IF [place] [phone]   If you received a COVID test during your pre-op visit  it is  requested that you wear a mask when out in public, stay away from anyone that may not be feeling well and notify your surgeon if you develop symptoms. If you test positive for Covid or have been in contact with anyone that has tested positive in the last 10 days please notify you surgeon.     [facility] - Preparing for Surgery Before surgery, you can play an important role.  Because skin is not sterile, your skin needs to be as free of germs as possible.  You can reduce the number of germs on your skin by washing with CHG (chlorahexidine gluconate) soap before surgery.  CHG is an antiseptic cleaner which kills germs and bonds with the skin to continue killing germs even after washing. Please DO NOT use if you have an allergy to CHG or antibacterial soaps.  If your skin becomes reddened/irritated stop using the CHG and inform your nurse when you arrive at Short Stay. Do not shave (including legs and underarms) for at least 48 hours prior to the first CHG shower.  You may shave your face/neck. Please follow these instructions carefully:  1.  Shower with CHG Soap the night before surgery and the  morning of Surgery.  2.  If you  choose to wash your hair, wash your hair first as usual with your  normal  shampoo.  3.  After you shampoo, rinse your hair and body thoroughly to remove the  shampoo.                           4.  Use CHG as you would any other liquid soap.  You can apply chg directly  to the skin and wash                       Gently with a scrungie or clean washcloth.  5.  Apply the CHG Soap to your body ONLY FROM THE NECK DOWN.   Do not use on face/ open                           Wound or open sores. Avoid contact with eyes, ears mouth and genitals (private parts).                       Wash face,  Genitals (private parts) with your normal soap.             6.  Wash thoroughly, paying special attention to the area where your surgery  will be performed.  7.  Thoroughly rinse your body with  warm water from the neck down.  8.  DO NOT shower/wash with your normal soap after using and rinsing off  the CHG Soap.                9.  Pat yourself dry with a clean towel.            10.  Wear clean pajamas.            11.  Place clean sheets on your bed the night of your first shower and do not  sleep with pets. Day of Surgery : Do not apply any lotions/deodorants the morning of surgery.  Please wear clean clothes to the hospital/surgery center.  FAILURE TO FOLLOW THESE INSTRUCTIONS MAY RESULT IN THE CANCELLATION OF YOUR SURGERY PATIENT SIGNATURE_________________________________  NURSE SIGNATURE__________________________________  ________________________________________________________________________

## 2022-02-04 ENCOUNTER — Encounter (HOSPITAL_COMMUNITY)
Admission: RE | Admit: 2022-02-04 | Discharge: 2022-02-04 | Disposition: A | Discharge status: Home / Self Care | Payer: 59 | Source: Ambulatory Visit | Attending: Urology | Admitting: Urology

## 2022-02-04 ENCOUNTER — Other Ambulatory Visit: Payer: Self-pay

## 2022-02-04 ENCOUNTER — Encounter (HOSPITAL_COMMUNITY): Payer: Self-pay

## 2022-02-04 VITALS — BP 176/111 | HR 98 | Temp 98.3°F | Resp 16 | Ht 73.0 in | Wt 281.0 lb

## 2022-02-04 DIAGNOSIS — R9431 Abnormal electrocardiogram [ECG] [EKG]: Secondary | ICD-10-CM | POA: Insufficient documentation

## 2022-02-04 DIAGNOSIS — Z01818 Encounter for other preprocedural examination: Secondary | ICD-10-CM | POA: Insufficient documentation

## 2022-02-04 HISTORY — DX: Other complications of anesthesia, initial encounter: T88.59XA

## 2022-02-04 HISTORY — DX: Malignant (primary) neoplasm, unspecified: C80.1

## 2022-02-04 HISTORY — DX: Personal history of urinary calculi: Z87.442

## 2022-02-04 LAB — CBC
HCT: 46.9 % (ref 39.0–52.0)
Hemoglobin: 15.2 g/dL (ref 13.0–17.0)
MCH: 28.6 pg (ref 26.0–34.0)
MCHC: 32.4 g/dL (ref 30.0–36.0)
MCV: 88.2 fL (ref 80.0–100.0)
Platelets: 239 10*3/uL (ref 150–400)
RBC: 5.32 MIL/uL (ref 4.22–5.81)
RDW: 13.6 % (ref 11.5–15.5)
WBC: 9.9 10*3/uL (ref 4.0–10.5)
nRBC: 0 % (ref 0.0–0.2)

## 2022-02-04 LAB — BASIC METABOLIC PANEL
Anion gap: 8 (ref 5–15)
BUN: 15 mg/dL (ref 6–20)
CO2: 28 mmol/L (ref 22–32)
Calcium: 9 mg/dL (ref 8.9–10.3)
Chloride: 104 mmol/L (ref 98–111)
Creatinine, Ser: 1.08 mg/dL (ref 0.61–1.24)
GFR, Estimated: >60 mL/min (ref >60)
Glucose, Bld: 90 mg/dL (ref 70–99)
Potassium: 3.5 mmol/L (ref 3.5–5.1)
Sodium: 140 mmol/L (ref 135–145)

## 2022-02-06 ENCOUNTER — Encounter (HOSPITAL_COMMUNITY): Payer: Self-pay | Admitting: Urology

## 2022-02-06 NOTE — H&P (Signed)
Office Visit Report     01/22/2022   --------------------------------------------------------------------------------   Aaron Moore  MRN: [id number]  DOB: 02/20/1966, 56 year old Male  SSN: [id number]   PRIMARY CARE:  Dana C. Theda Sers, MD  REFERRING:  Lillette Boxer. Dahlstedt, MD  PROVIDER:  Franchot Gallo, M.D.  TREATING:  Raynelle Bring, M.D.  LOCATION:  [facility] [phone]     --------------------------------------------------------------------------------   CC/HPI: CC: Prostate Cancer   Physician requesting consult: Dr. Franchot Gallo  PCP: Dr. Janie Morning   Mr. Schoon is a 56 year old gentleman who was found to have an elevated PSA of 11.6. This prompted a TRUS biopsy on 11/12/21 that demonstrated Gleason 4+4=8 adenocarcinoma of the prostate with 7 out of 12 biopsy cores positive for malignancy.   Family history: None.   Imaging studies: PSMA PET scan (12/06/21): Uptake in right prostate, no metastatic disease.   PMH: He has a history of gout, hypertension, and urolithiasis.  PSH: Umbilical hernia repair with mesh.   TNM stage: cT1c N0 M0  PSA: 11.6  Gleason score: 4+4=8 (GG 4)  Biopsy (11/12/21): 7/12 cores positive  Left: L lateral apex (5%, 3+3=6)  Right: R apex (< 1%, 4+3=7), R lateral apex (40%, 4+4=8), R mid (30%, 4+4=8), R lateral mid (20%, 4+3=7), R base (50%, 4+4=8), R lateral base (30%, 4+4=8)  Prostate volume: 41.2 cc   Nomogram  OC disease: 32%  EPE: 65%  SVI: 18%  LNI: 20%  PFS (5 year, 10 year): 40%, 26%   Urinary function: IPSS is 16. His main symptoms include straining, weak stream, and intermittency. He is not on medical therapy.  Erectile function: SHIM score is 16. He states that it is quite difficult for him to get an adequate erection. He has never tried oral medical therapy.     ALLERGIES: No Allergies    MEDICATIONS: Allopurinol 300 mg tablet  Levofloxacin 750 mg tablet 1 tablet PO Morning of procedure  Atenolol   Losartan Potassium 100 mg tablet     GU PSH: Prostate Needle Biopsy - 11/12/2021     NON-GU PSH: Carpal tunnel surgery Hernia Repair Revise Big Toe Surgical Pathology, Gross And Microscopic Examination For Prostate Needle - 11/12/2021     GU PMH: Prostate Cancer, Grade group 4 prostate cancer. Fairly high volume. Negative PSMA PET scan. He does have moderate lower urinary tract symptoms. - 12/28/2021 Elevated PSA - 11/12/2021, Persistently elevated PSA, benign exam, - 09/07/2021 Ureteral calculus (Improving), Left - 2019, (Acute), Left, Most likely distally progressing left ureteral stone, - 2019 Renal calculus, Nephrolithiasis - 2014      PMH Notes:  2006-03-06 14:00:44 - Note: Blood In The Urine   NON-GU PMH: Encounter for general adult medical examination without abnormal findings, Encounter for preventive health examination Gout Hypertension    FAMILY HISTORY: Congestive Heart Failure - Mother Death In The Family Father - Father Death In The Family Mother - Mother Diabetes - Runs In Family Heart Disease - Runs In Family Hypertension - Runs In Family Nephronophthisis - Father Overweight - Runs In Family Urologic Disorder - Runs In Family   SOCIAL HISTORY: Marital Status: Married Preferred Language: English; Ethnicity: Not Hispanic Or Latino; Race: White Current Smoking Status: Patient has never smoked.   Tobacco Use Assessment Completed: Used Tobacco in last 30 days? Has never drank.  Drinks 2 caffeinated drinks per day. 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    REVIEW OF SYSTEMS:    GU Review  Male:   Patient denies frequent urination, hard to postpone urination, burning/ pain with urination, get up at night to urinate, leakage of urine, stream starts and stops, trouble starting your streams, and have to strain to urinate .  Gastrointestinal (Lower):   Patient denies diarrhea and constipation.  Gastrointestinal (Upper):   Patient denies nausea and vomiting.   Constitutional:   Patient denies fever, night sweats, weight loss, and fatigue.  Skin:   Patient denies skin rash/ lesion and itching.  Eyes:   Patient denies blurred vision and double vision.  Ears/ Nose/ Throat:   Patient denies sore throat and sinus problems.  Hematologic/Lymphatic:   Patient denies swollen glands and easy bruising.  Cardiovascular:   Patient denies leg swelling and chest pains.  Respiratory:   Patient denies cough and shortness of breath.  Endocrine:   Patient denies excessive thirst.  Musculoskeletal:   Patient denies back pain and joint pain.  Neurological:   Patient denies headaches and dizziness.  Psychologic:   Patient denies depression and anxiety.   VITAL SIGNS:      01/22/2022 08:07 AM  Height 73 in / 185.42 cm  BP 189/111 mmHg  Pulse 102 /min  Temperature 98.0 F / 36.6 C   GU PHYSICAL EXAMINATION:    Prostate: Prostate about 40 grams. Left lobe normal consistency, right lobe normal consistency. Symmetrical lobes. No prostate nodule. Left lobe no tenderness, right lobe no tenderness.    MULTI-SYSTEM PHYSICAL EXAMINATION:    Constitutional: Well-nourished. No physical deformities. Normally developed. Good grooming.  Respiratory: No labored breathing, no use of accessory muscles. Clear bilaterally.  Cardiovascular: Normal temperature, normal extremity pulses, no swelling, no varicosities. Regular rate and rhythm.  Gastrointestinal: Obese, soft, nondistended. No abdominal masses.     Complexity of Data:  Lab Test Review:   PSA  Records Review:   Pathology Reports, Previous Patient Records  X-Ray Review: PET- PSMA Scan: Reviewed Films.     07/31/21  PSA  Total PSA 11.6 ng/ml   Notes:                     CLINICAL DATA: Recent diagnosis of prostate carcinoma. PSA equal  11.6   EXAM:  NUCLEAR MEDICINE PET SKULL BASE TO THIGH   TECHNIQUE:  9.1 mCi F18 Piflufolastat (Pylarify) was injected intravenously.  Full-ring PET imaging was performed from the  skull base to thigh  after the radiotracer. CT data was obtained and used for attenuation  correction and anatomic localization.   COMPARISON: None Available.   FINDINGS:  NECK   No radiotracer activity in neck lymph nodes.   Incidental CT finding: None.   CHEST   No radiotracer accumulation within mediastinal or hilar lymph nodes.  No suspicious pulmonary nodules on the CT scan.   Incidental CT finding: None.   ABDOMEN/PELVIS   Prostate: Intense radiotracer activity involving a large portion of  the RIGHT lobe of the prostate gland SUV max equal 30 (image 218)   Lymph nodes: No abnormal radiotracer accumulation within pelvic or  abdominal nodes.   Liver: No evidence of liver metastasis.   Incidental CT finding: Bilateral nonobstructing renal calculi.   SKELETON   No focal activity to suggest skeletal metastasis.   IMPRESSION:  1. Intense activity involving a large portion of the RIGHT lobe of  the prostate gland consistent with primary prostate adenocarcinoma.  2. No evidence metastatic adenopathy in the pelvis or periaortic  retroperitoneum.  3. No evidence of visceral metastasis  or skeletal metastasis.  4. Incidental finding of bilateral nonobstructing renal calculi.    Electronically Signed  By: Suzy Bouchard M.D.  On: 12/07/2021 16:57   PROCEDURES: None   ASSESSMENT:      ICD-10 Details  1 GU:   Prostate Cancer - C61    PLAN:           Schedule Return Visit/Planned Activity: Other See Visit Notes             Note: Will call to schedule surgery.  Return Visit/Planned Activity: Next Available Appointment - PT Referral             Note: Please schedule patient for preoperative PT prior to radical prostatectomy.            Document Letter(s):  Created for Patient: Clinical Summary         Notes:   1. High risk prostate cancer: I had a detailed discussion with Mr. Downs and his wife today regarding his prostate cancer situation. The patient was  counseled about the natural history of prostate cancer and the standard treatment options that are available for prostate cancer. It was explained to him how his age and life expectancy, clinical stage, Gleason score/prognostic grade group, and PSA (and PSA density) affect his prognosis, the decision to proceed with additional staging studies, as well as how that information influences recommended treatment strategies. We discussed the roles for active surveillance, radiation therapy, surgical therapy, androgen deprivation, as well as ablative therapy and other investigational options for the treatment of prostate cancer as appropriate to his individual cancer situation. We discussed the risks and benefits of these options with regard to their impact on cancer control and also in terms of potential adverse events, complications, and impact on quality of life particularly related to urinary and sexual function. The patient was encouraged to ask questions throughout the discussion today and all questions were answered to his stated satisfaction. In addition, the patient was provided with and/or directed to appropriate resources and literature for further education about prostate cancer and treatment options. We discussed surgical therapy for prostate cancer including the different available surgical approaches. We discussed, in detail, the risks and expectations of surgery with regard to cancer control, urinary control, and erectile function as well as the expected postoperative recovery process. Additional risks of surgery including but not limited to bleeding, infection, hernia formation, nerve damage, lymphocele formation, bowel/rectal injury potentially necessitating colostomy, damage to the urinary tract resulting in urine leakage, urethral stricture, and the cardiopulmonary risks such as myocardial infarction, stroke, death, venothromboembolism, etc. were explained. The risk of open surgical conversion for  robotic/laparoscopic prostatectomy was also discussed.   At this time, he has elected to proceed with surgical therapy. I did offer him a radiation oncology consultation which he declines. He will be scheduled for a unilateral left nerve sparing robot-assisted laparoscopic radical prostatectomy and bilateral pelvic lymphadenectomy. He does have appropriate expectations with regard to postoperative erectile dysfunction and this is a lower priority for him and his wife currently. He did have some difficulty with general anesthesia which sounds like it took him longer to wake up and would be expected but without major complications. This was related to a procedure he had at the [facility] outpatient surgical center.   CC: Dr. Janie Morning  Dr. Franchot Gallo        Next Appointment:      Next Appointment: 01/29/2022 08:00 AM    Appointment Type: 60 Physical  Therapy    Location: [facility] [phone]    Provider: Nyra Capes    Reason for Visit: Next ava preoperative PT prior to radical prostatectomy.      E & M CODES: We spent 49 minutes dedicated to evaluation and management time, including face to face interaction, discussions on coordination of care, documentation, result review, and discussion with others as applicable.     * Signed by Raynelle Bring, M.D. on 01/23/22 at 5:32 PM (EDT)*

## 2022-02-07 ENCOUNTER — Observation Stay (HOSPITAL_COMMUNITY)
Admission: RE | Admit: 2022-02-07 | Discharge: 2022-02-08 | Disposition: A | Discharge status: Home / Self Care | Payer: 59 | Attending: Urology | Admitting: Urology

## 2022-02-07 ENCOUNTER — Other Ambulatory Visit: Payer: Self-pay

## 2022-02-07 ENCOUNTER — Encounter (HOSPITAL_COMMUNITY)
Admission: RE | Disposition: A | Discharge status: Home / Self Care | Payer: Self-pay | Source: Home / Self Care | Attending: Urology

## 2022-02-07 ENCOUNTER — Encounter (HOSPITAL_COMMUNITY): Payer: Self-pay | Admitting: Urology

## 2022-02-07 ENCOUNTER — Ambulatory Visit (HOSPITAL_COMMUNITY): Payer: 59 | Admitting: Physician Assistant

## 2022-02-07 ENCOUNTER — Ambulatory Visit (HOSPITAL_BASED_OUTPATIENT_CLINIC_OR_DEPARTMENT_OTHER): Payer: 59 | Admitting: Anesthesiology

## 2022-02-07 DIAGNOSIS — I10 Essential (primary) hypertension: Secondary | ICD-10-CM | POA: Insufficient documentation

## 2022-02-07 DIAGNOSIS — C61 Malignant neoplasm of prostate: Secondary | ICD-10-CM

## 2022-02-07 DIAGNOSIS — Z01818 Encounter for other preprocedural examination: Secondary | ICD-10-CM

## 2022-02-07 HISTORY — PX: LYMPHADENECTOMY: SHX5960

## 2022-02-07 HISTORY — PX: ROBOT ASSISTED LAPAROSCOPIC RADICAL PROSTATECTOMY: SHX5141

## 2022-02-07 LAB — HEMOGLOBIN AND HEMATOCRIT, BLOOD
HCT: 40.2 % (ref 39.0–52.0)
Hemoglobin: 13.1 g/dL (ref 13.0–17.0)

## 2022-02-07 LAB — TYPE AND SCREEN
ABO/RH(D): O POS
Antibody Screen: NEGATIVE

## 2022-02-07 LAB — ABO/RH: ABO/RH(D): O POS

## 2022-02-07 SURGERY — XI ROBOTIC ASSISTED LAPAROSCOPIC RADICAL PROSTATECTOMY LEVEL 3
Anesthesia: General | Site: Abdomen

## 2022-02-07 MED ORDER — TRAMADOL HCL 50 MG PO TABS: 50.0000 mg | ORAL_TABLET | Freq: Four times a day (QID) | ORAL | 0 refills | Status: DC | PRN

## 2022-02-07 MED ORDER — LACTATED RINGERS IV SOLN: INTRAVENOUS | Status: DC

## 2022-02-07 MED ORDER — IRBESARTAN 300 MG PO TABS
300.0000 mg | ORAL_TABLET | Freq: Every day | ORAL | Status: DC
  Administered 2022-02-08: 300 mg via ORAL
  Filled 2022-02-07: qty 1

## 2022-02-07 MED ORDER — FENTANYL CITRATE (PF) 100 MCG/2ML IJ SOLN
INTRAMUSCULAR | Status: DC | PRN
  Administered 2022-02-07: 100 ug via INTRAVENOUS
  Administered 2022-02-07 (×3): 50 ug via INTRAVENOUS

## 2022-02-07 MED ORDER — BUPIVACAINE-EPINEPHRINE 0.25% -1:200000 IJ SOLN
INTRAMUSCULAR | Status: DC | PRN
  Administered 2022-02-07: 10 mL
  Administered 2022-02-07: 20 mL

## 2022-02-07 MED ORDER — ROCURONIUM BROMIDE 10 MG/ML (PF) SYRINGE
PREFILLED_SYRINGE | INTRAVENOUS | Status: DC | PRN
  Administered 2022-02-07: 30 mg via INTRAVENOUS
  Administered 2022-02-07: 70 mg via INTRAVENOUS
  Administered 2022-02-07: 20 mg via INTRAVENOUS
  Administered 2022-02-07: 40 mg via INTRAVENOUS

## 2022-02-07 MED ORDER — CHLORHEXIDINE GLUCONATE 0.12 % MT SOLN
15.0000 mL | Freq: Once | OROMUCOSAL | Status: AC
  Administered 2022-02-07: 15 mL via OROMUCOSAL

## 2022-02-07 MED ORDER — KCL IN DEXTROSE-NACL 20-5-0.45 MEQ/L-%-% IV SOLN
INTRAVENOUS | Status: DC
  Filled 2022-02-07 (×3): qty 1000

## 2022-02-07 MED ORDER — MIDAZOLAM HCL 5 MG/5ML IJ SOLN
INTRAMUSCULAR | Status: DC | PRN
  Administered 2022-02-07: 2 mg via INTRAVENOUS

## 2022-02-07 MED ORDER — DOCUSATE SODIUM 100 MG PO CAPS
100.0000 mg | ORAL_CAPSULE | Freq: Two times a day (BID) | ORAL | Status: DC
  Administered 2022-02-07 – 2022-02-08 (×2): 100 mg via ORAL
  Filled 2022-02-07 (×2): qty 1

## 2022-02-07 MED ORDER — CEFAZOLIN IN SODIUM CHLORIDE 3-0.9 GM/100ML-% IV SOLN
3.0000 g | INTRAVENOUS | Status: AC
  Administered 2022-02-07: 3 g via INTRAVENOUS
  Filled 2022-02-07: qty 100

## 2022-02-07 MED ORDER — DIPHENHYDRAMINE HCL 50 MG/ML IJ SOLN: 12.5000 mg | Freq: Four times a day (QID) | INTRAMUSCULAR | Status: DC | PRN

## 2022-02-07 MED ORDER — FLEET ENEMA 7-19 GM/118ML RE ENEM: 1.0000 | ENEMA | Freq: Once | RECTAL | Status: DC

## 2022-02-07 MED ORDER — DIPHENHYDRAMINE HCL 12.5 MG/5ML PO ELIX: 12.5000 mg | ORAL_SOLUTION | Freq: Four times a day (QID) | ORAL | Status: DC | PRN

## 2022-02-07 MED ORDER — AMLODIPINE BESYLATE 10 MG PO TABS
10.0000 mg | ORAL_TABLET | Freq: Every day | ORAL | Status: DC
  Administered 2022-02-08: 10 mg via ORAL
  Filled 2022-02-07: qty 1

## 2022-02-07 MED ORDER — AMLODIPINE BESYLATE-VALSARTAN 10-320 MG PO TABS: 1.0000 | ORAL_TABLET | Freq: Every day | ORAL | Status: DC

## 2022-02-07 MED ORDER — PROPOFOL 10 MG/ML IV BOLUS
INTRAVENOUS | Status: DC | PRN
  Administered 2022-02-07: 200 mg via INTRAVENOUS

## 2022-02-07 MED ORDER — STERILE WATER FOR IRRIGATION IR SOLN
Status: DC | PRN
  Administered 2022-02-07: 1000 mL

## 2022-02-07 MED ORDER — KETAMINE HCL 10 MG/ML IJ SOLN
INTRAMUSCULAR | Status: DC | PRN
  Administered 2022-02-07: 30 mg via INTRAVENOUS

## 2022-02-07 MED ORDER — SUGAMMADEX SODIUM 500 MG/5ML IV SOLN
INTRAVENOUS | Status: DC | PRN
  Administered 2022-02-07: 255 mg via INTRAVENOUS

## 2022-02-07 MED ORDER — SUGAMMADEX SODIUM 500 MG/5ML IV SOLN
INTRAVENOUS | Status: AC
  Filled 2022-02-07: qty 5

## 2022-02-07 MED ORDER — LIDOCAINE 2% (20 MG/ML) 5 ML SYRINGE
INTRAMUSCULAR | Status: DC | PRN
  Administered 2022-02-07: 80 mg via INTRAVENOUS

## 2022-02-07 MED ORDER — SODIUM CHLORIDE (PF) 0.9 % IJ SOLN
INTRAMUSCULAR | Status: AC
  Filled 2022-02-07: qty 30

## 2022-02-07 MED ORDER — HYOSCYAMINE SULFATE 0.125 MG SL SUBL: 0.1250 mg | SUBLINGUAL_TABLET | Freq: Four times a day (QID) | SUBLINGUAL | Status: DC | PRN

## 2022-02-07 MED ORDER — PHENYLEPHRINE 80 MCG/ML (10ML) SYRINGE FOR IV PUSH (FOR BLOOD PRESSURE SUPPORT)
PREFILLED_SYRINGE | INTRAVENOUS | Status: AC
  Filled 2022-02-07: qty 10

## 2022-02-07 MED ORDER — MIDAZOLAM HCL 2 MG/2ML IJ SOLN
INTRAMUSCULAR | Status: AC
  Filled 2022-02-07: qty 2

## 2022-02-07 MED ORDER — PHENYLEPHRINE 80 MCG/ML (10ML) SYRINGE FOR IV PUSH (FOR BLOOD PRESSURE SUPPORT)
PREFILLED_SYRINGE | INTRAVENOUS | Status: DC | PRN
  Administered 2022-02-07 (×3): 80 ug via INTRAVENOUS

## 2022-02-07 MED ORDER — ACETAMINOPHEN 10 MG/ML IV SOLN: 1000.0000 mg | Freq: Once | INTRAVENOUS | Status: DC | PRN

## 2022-02-07 MED ORDER — MAGNESIUM CITRATE PO SOLN: 1.0000 | Freq: Once | ORAL | Status: DC

## 2022-02-07 MED ORDER — ACETAMINOPHEN 325 MG PO TABS: 650.0000 mg | ORAL_TABLET | ORAL | Status: DC | PRN

## 2022-02-07 MED ORDER — MORPHINE SULFATE (PF) 2 MG/ML IV SOLN: 2.0000 mg | INTRAVENOUS | Status: DC | PRN

## 2022-02-07 MED ORDER — TRIPLE ANTIBIOTIC 3.5-400-5000 EX OINT: 1.0000 | TOPICAL_OINTMENT | Freq: Three times a day (TID) | CUTANEOUS | Status: DC | PRN

## 2022-02-07 MED ORDER — CEFAZOLIN SODIUM-DEXTROSE 1-4 GM/50ML-% IV SOLN
1.0000 g | Freq: Three times a day (TID) | INTRAVENOUS | Status: AC
  Administered 2022-02-07 – 2022-02-08 (×2): 1 g via INTRAVENOUS
  Filled 2022-02-07 (×2): qty 50

## 2022-02-07 MED ORDER — PHENYLEPHRINE HCL-NACL 20-0.9 MG/250ML-% IV SOLN
INTRAVENOUS | Status: DC | PRN
  Administered 2022-02-07: 35 ug/min via INTRAVENOUS

## 2022-02-07 MED ORDER — FENTANYL CITRATE (PF) 250 MCG/5ML IJ SOLN
INTRAMUSCULAR | Status: AC
  Filled 2022-02-07: qty 5

## 2022-02-07 MED ORDER — FENTANYL CITRATE PF 50 MCG/ML IJ SOSY
25.0000 ug | PREFILLED_SYRINGE | INTRAMUSCULAR | Status: DC | PRN
  Administered 2022-02-07: 25 ug via INTRAVENOUS

## 2022-02-07 MED ORDER — KETOROLAC TROMETHAMINE 15 MG/ML IJ SOLN
15.0000 mg | Freq: Four times a day (QID) | INTRAMUSCULAR | Status: DC
  Administered 2022-02-07 – 2022-02-08 (×3): 15 mg via INTRAVENOUS
  Filled 2022-02-07 (×3): qty 1

## 2022-02-07 MED ORDER — LACTATED RINGERS IV SOLN: INTRAVENOUS | Status: DC | PRN

## 2022-02-07 MED ORDER — SODIUM CHLORIDE 0.9 % IV BOLUS
1000.0000 mL | Freq: Once | INTRAVENOUS | Status: AC
  Administered 2022-02-07: 1000 mL via INTRAVENOUS

## 2022-02-07 MED ORDER — SODIUM CHLORIDE (PF) 0.9 % IJ SOLN
INTRAMUSCULAR | Status: AC
  Filled 2022-02-07: qty 20

## 2022-02-07 MED ORDER — HEPARIN SODIUM (PORCINE) 1000 UNIT/ML IJ SOLN
INTRAMUSCULAR | Status: AC
  Filled 2022-02-07: qty 1

## 2022-02-07 MED ORDER — SULFAMETHOXAZOLE-TRIMETHOPRIM 800-160 MG PO TABS: 1.0000 | ORAL_TABLET | Freq: Two times a day (BID) | ORAL | 0 refills | Status: DC

## 2022-02-07 MED ORDER — ORAL CARE MOUTH RINSE: 15.0000 mL | Freq: Once | OROMUCOSAL | Status: AC

## 2022-02-07 MED ORDER — LACTATED RINGERS IV SOLN
INTRAVENOUS | Status: DC | PRN
  Administered 2022-02-07: 1000 mL

## 2022-02-07 MED ORDER — ZOLPIDEM TARTRATE 5 MG PO TABS: 5.0000 mg | ORAL_TABLET | Freq: Every evening | ORAL | Status: DC | PRN

## 2022-02-07 MED ORDER — ONDANSETRON HCL 4 MG/2ML IJ SOLN
INTRAMUSCULAR | Status: DC | PRN
  Administered 2022-02-07: 4 mg via INTRAVENOUS

## 2022-02-07 MED ORDER — BUPIVACAINE-EPINEPHRINE (PF) 0.25% -1:200000 IJ SOLN
INTRAMUSCULAR | Status: AC
  Filled 2022-02-07: qty 30

## 2022-02-07 MED ORDER — DEXAMETHASONE SODIUM PHOSPHATE 10 MG/ML IJ SOLN
INTRAMUSCULAR | Status: DC | PRN
  Administered 2022-02-07: 10 mg via INTRAVENOUS

## 2022-02-07 MED ORDER — DOCUSATE SODIUM 100 MG PO CAPS: 100.0000 mg | ORAL_CAPSULE | Freq: Two times a day (BID) | ORAL | Status: DC

## 2022-02-07 MED ORDER — BUPIVACAINE LIPOSOME 1.3 % IJ SUSP
INTRAMUSCULAR | Status: AC
  Filled 2022-02-07: qty 20

## 2022-02-07 MED ORDER — ONDANSETRON HCL 4 MG/2ML IJ SOLN: 4.0000 mg | INTRAMUSCULAR | Status: DC | PRN

## 2022-02-07 MED ORDER — FENTANYL CITRATE PF 50 MCG/ML IJ SOSY
PREFILLED_SYRINGE | INTRAMUSCULAR | Status: AC
  Administered 2022-02-07: 25 ug via INTRAVENOUS
  Filled 2022-02-07: qty 1

## 2022-02-07 MED ORDER — SODIUM CHLORIDE 0.9 % IR SOLN
Status: DC | PRN
  Administered 2022-02-07: 1000 mL via INTRAVESICAL

## 2022-02-07 SURGICAL SUPPLY — 64 items
APPLICATOR COTTON TIP 6 STRL (MISCELLANEOUS) ×2 IMPLANT
APPLICATOR COTTON TIP 6IN STRL (MISCELLANEOUS) ×2
BAG COUNTER SPONGE SURGICOUNT (BAG) IMPLANT
CATH FOLEY 2WAY SLVR 18FR 30CC (CATHETERS) ×2 IMPLANT
CATH ROBINSON RED A/P 16FR (CATHETERS) ×2 IMPLANT
CATH ROBINSON RED A/P 8FR (CATHETERS) ×2 IMPLANT
CATH TIEMANN FOLEY 18FR 5CC (CATHETERS) ×2 IMPLANT
CHLORAPREP W/TINT 26 (MISCELLANEOUS) ×2 IMPLANT
CLIP LIGATING HEM O LOK PURPLE (MISCELLANEOUS) ×2 IMPLANT
COVER SURGICAL LIGHT HANDLE (MISCELLANEOUS) ×2 IMPLANT
COVER TIP SHEARS 8 DVNC (MISCELLANEOUS) ×2 IMPLANT
COVER TIP SHEARS 8MM DA VINCI (MISCELLANEOUS) ×2
CUTTER ECHEON FLEX ENDO 45 340 (ENDOMECHANICALS) ×2 IMPLANT
DERMABOND ADVANCED .7 DNX12 (GAUZE/BANDAGES/DRESSINGS) ×2 IMPLANT
DRAIN CHANNEL RND F F (WOUND CARE) IMPLANT
DRAPE ARM DVNC X/XI (DISPOSABLE) ×8 IMPLANT
DRAPE COLUMN DVNC XI (DISPOSABLE) ×2 IMPLANT
DRAPE DA VINCI XI ARM (DISPOSABLE) ×8
DRAPE DA VINCI XI COLUMN (DISPOSABLE) ×2
DRAPE SURG IRRIG POUCH 19X23 (DRAPES) ×2 IMPLANT
DRSG TEGADERM 4X4.75 (GAUZE/BANDAGES/DRESSINGS) ×2 IMPLANT
ELECT PENCIL ROCKER SW 15FT (MISCELLANEOUS) ×2 IMPLANT
ELECT REM PT RETURN 15FT ADLT (MISCELLANEOUS) ×2 IMPLANT
GAUZE 4X4 16PLY ~~LOC~~+RFID DBL (SPONGE) ×2 IMPLANT
GAUZE SPONGE 4X4 12PLY STRL (GAUZE/BANDAGES/DRESSINGS) ×2 IMPLANT
GLOVE BIO SURGEON STRL SZ 6.5 (GLOVE) ×2 IMPLANT
GLOVE SURG LX STRL 7.5 STRW (GLOVE) ×4 IMPLANT
GOWN SRG XL LVL 4 BRTHBL STRL (GOWNS) ×2 IMPLANT
GOWN STRL NON-REIN XL LVL4 (GOWNS) ×2
GOWN STRL REUS W/ TWL XL LVL3 (GOWN DISPOSABLE) ×4 IMPLANT
GOWN STRL REUS W/TWL XL LVL3 (GOWN DISPOSABLE) ×4
HOLDER FOLEY CATH W/STRAP (MISCELLANEOUS) ×2 IMPLANT
IRRIG SUCT STRYKERFLOW 2 WTIP (MISCELLANEOUS) ×2
IRRIGATION SUCT STRKRFLW 2 WTP (MISCELLANEOUS) ×2 IMPLANT
IV LACTATED RINGERS 1000ML (IV SOLUTION) ×2 IMPLANT
KIT TURNOVER KIT A (KITS) IMPLANT
NDL SAFETY ECLIP 18X1.5 (MISCELLANEOUS) ×2 IMPLANT
PACK ROBOT UROLOGY CUSTOM (CUSTOM PROCEDURE TRAY) ×2 IMPLANT
RELOAD STAPLE 45 4.1 GRN THCK (STAPLE) ×2 IMPLANT
SEAL CANN UNIV 5-8 DVNC XI (MISCELLANEOUS) ×8 IMPLANT
SEAL XI 5MM-8MM UNIVERSAL (MISCELLANEOUS) ×8
SET TUBE SMOKE EVAC HIGH FLOW (TUBING) ×2 IMPLANT
SOLUTION ELECTROLUBE (MISCELLANEOUS) ×2 IMPLANT
SPIKE FLUID TRANSFER (MISCELLANEOUS) ×2 IMPLANT
STAPLE RELOAD 45 GRN (STAPLE) ×2 IMPLANT
STAPLE RELOAD 45MM GREEN (STAPLE) ×2
SUT ETHILON 3 0 PS 1 (SUTURE) ×2 IMPLANT
SUT MNCRL 3 0 RB1 (SUTURE) ×2 IMPLANT
SUT MNCRL 3 0 VIOLET RB1 (SUTURE) ×2 IMPLANT
SUT MNCRL AB 4-0 PS2 18 (SUTURE) ×4 IMPLANT
SUT MONOCRYL 3 0 RB1 (SUTURE) ×4
SUT PDS PLUS 0 (SUTURE) ×4
SUT PDS PLUS AB 0 CT-2 (SUTURE) ×4 IMPLANT
SUT VIC AB 0 CT1 27 (SUTURE) ×4
SUT VIC AB 0 CT1 27XBRD ANTBC (SUTURE) ×4 IMPLANT
SUT VIC AB 0 CT1 36 (SUTURE) IMPLANT
SUT VIC AB 2-0 CT1 27 (SUTURE) ×2
SUT VIC AB 2-0 CT1 27XBRD (SUTURE) IMPLANT
SUT VIC AB 2-0 SH 27 (SUTURE) ×4
SUT VIC AB 2-0 SH 27X BRD (SUTURE) ×2 IMPLANT
SYR 27GX1/2 1ML LL SAFETY (SYRINGE) ×2 IMPLANT
TOWEL OR NON WOVEN STRL DISP B (DISPOSABLE) ×2 IMPLANT
TROCAR Z-THREAD FIOS 5X100MM (TROCAR) IMPLANT
WATER STERILE IRR 1000ML POUR (IV SOLUTION) ×2 IMPLANT

## 2022-02-07 NOTE — Anesthesia Postprocedure Evaluation (Signed)
Anesthesia Post Note  Patient: Rollen Selders  Procedure(s) Performed: XI ROBOTIC ASSISTED LAPAROSCOPIC RADICAL PROSTATECTOMY LEVEL 3 (Abdomen) BILATERAL PELVIC LYMPHADENECTOMY (Bilateral: Abdomen)     Patient location during evaluation: PACU Anesthesia Type: General Level of consciousness: awake and alert Pain management: pain level controlled Vital Signs Assessment: post-procedure vital signs reviewed and stable Respiratory status: spontaneous breathing, nonlabored ventilation, respiratory function stable and patient connected to nasal cannula oxygen Cardiovascular status: blood pressure returned to baseline and stable Postop Assessment: no apparent nausea or vomiting Anesthetic complications: yes   Encounter Notable Events  Notable Event Outcome Phase Comment  Difficult to intubate - expected  Intraprocedure Filed from anesthesia note documentation.    Last Vitals:  Vitals:   02/07/22 1730 02/07/22 1812  BP: (!) 168/95 (!) 153/89  Pulse: 83 80  Resp: 17 19  Temp:    SpO2: 95% 97%    Last Pain:  Vitals:   02/07/22 1812  TempSrc:   PainSc: 0-No pain                 Belenda Cruise P Quaniyah Bugh

## 2022-02-07 NOTE — Transfer of Care (Signed)
Immediate Anesthesia Transfer of Care Note  Patient: Delmon Andrada  Procedure(s) Performed: XI ROBOTIC ASSISTED LAPAROSCOPIC RADICAL PROSTATECTOMY LEVEL 3 (Abdomen) BILATERAL PELVIC LYMPHADENECTOMY (Bilateral: Abdomen)  Patient Location: PACU  Anesthesia Type:General  Level of Consciousness: drowsy and patient cooperative  Airway & Oxygen Therapy: Patient Spontanous Breathing and Patient connected to face mask oxygen  Post-op Assessment: Report given to RN and Post -op Vital signs reviewed and stable  Post vital signs: Reviewed and stable  Last Vitals:  Vitals Value Taken Time  BP    Temp    Pulse    Resp    SpO2      Last Pain:  Vitals:   02/07/22 0958  TempSrc:   PainSc: 0-No pain         Complications:  Encounter Notable Events  Notable Event Outcome Phase Comment  Difficult to intubate - expected  Intraprocedure Filed from anesthesia note documentation.

## 2022-02-07 NOTE — Anesthesia Preprocedure Evaluation (Addendum)
Anesthesia Evaluation  Patient identified by MRN, date of birth, ID band Patient awake    Reviewed: Allergy & Precautions, NPO status , Patient's Chart, lab work & pertinent test results  History of Anesthesia Complications (+) PROLONGED EMERGENCE and history of anesthetic complications  Airway Mallampati: II  TM Distance: >3 FB Neck ROM: Full    Dental no notable dental hx.    Pulmonary neg pulmonary ROS   Pulmonary exam normal        Cardiovascular hypertension, Pt. on medications  Rhythm:Regular Rate:Normal     Neuro/Psych negative neurological ROS  negative psych ROS   GI/Hepatic negative GI ROS, Neg liver ROS,,,  Endo/Other  negative endocrine ROS    Renal/GU negative Renal ROS   Prostate Ca    Musculoskeletal negative musculoskeletal ROS (+)    Abdominal Normal abdominal exam  (+)   Peds  Hematology negative hematology ROS (+)   Anesthesia Other Findings   Reproductive/Obstetrics                             Anesthesia Physical Anesthesia Plan  ASA: 3  Anesthesia Plan: General   Post-op Pain Management:    Induction: Intravenous  PONV Risk Score and Plan: 2 and Ondansetron, Dexamethasone, Midazolam and Treatment may vary due to age or medical condition  Airway Management Planned: Mask and Oral ETT  Additional Equipment: None  Intra-op Plan:   Post-operative Plan: Extubation in OR  Informed Consent: I have reviewed the patients History and Physical, chart, labs and discussed the procedure including the risks, benefits and alternatives for the proposed anesthesia with the patient or authorized representative who has indicated his/her understanding and acceptance.     Dental advisory given  Plan Discussed with: CRNA  Anesthesia Plan Comments: (Lab Results      Component                Value               Date                      WBC                      9.9                  02/04/2022                HGB                      15.2                02/04/2022                HCT                      46.9                02/04/2022                MCV                      88.2                02/04/2022                PLT  239                 02/04/2022            Lab Results      Component                Value               Date                      NA                       140                 02/04/2022                K                        3.5                 02/04/2022                CO2                      28                  02/04/2022                GLUCOSE                  90                  02/04/2022                BUN                      15                  02/04/2022                CREATININE               1.08                02/04/2022                CALCIUM                  9.0                 02/04/2022                GFRNONAA                 >60                 02/04/2022           )       Anesthesia Quick Evaluation

## 2022-02-07 NOTE — Discharge Instructions (Signed)

## 2022-02-07 NOTE — Op Note (Signed)
Preoperative diagnosis: Clinically localized adenocarcinoma of the prostate (clinical stage T1c N0 M))  Postoperative diagnosis: Clinically localized adenocarcinoma of the prostate (clinical stage T1c N0 M))  Procedure:  Robotic assisted laparoscopic radical prostatectomy (left nerve sparing) Bilateral robotic assisted laparoscopic pelvic lymphadenectomy  Surgeon: Pryor Curia. M.D.  Assistant(s): Debbrah Alar, PA-C  An assistant was required for this surgical procedure.  The duties of the assistant included but were not limited to suctioning, passing suture, camera manipulation, retraction. This procedure would not be able to be performed without an Environmental consultant.   Anesthesia: General  Complications: None  EBL: 100 mL  IVF:  2000 mL crystalloid  Specimens: Prostate and seminal vesicles Right pelvic lymph nodes Left pelvic lymph nodes  Disposition of specimens: Pathology  Drains: 20 Fr coude catheter # 19 Blake pelvic drain  Indication: Aaron Moore is a 56 y.o. patient with clinically localized prostate cancer.  After a thorough review of the management options for treatment of prostate cancer, he elected to proceed with surgical therapy and the above procedure(s).  We have discussed the potential benefits and risks of the procedure, side effects of the proposed treatment, the likelihood of the patient achieving the goals of the procedure, and any potential problems that might occur during the procedure or recuperation. Informed consent has been obtained.  Description of procedure:  The patient was taken to the operating room and a general anesthetic was administered. He was given preoperative antibiotics, placed in the dorsal lithotomy position, and prepped and draped in the usual sterile fashion. Next a preoperative timeout was performed. A urethral catheter was placed into the bladder and a site was selected near the umbilicus for placement of the camera port. This was  placed using a standard open Hassan technique which allowed entry into the peritoneal cavity under direct vision and without difficulty. An 8 mm port was placed and a pneumoperitoneum established. The camera was then used to inspect the abdomen and there was no evidence of any intra-abdominal injuries or other abnormalities. The remaining abdominal ports were then placed. 8 mm robotic ports were placed in the right lower quadrant, left lower quadrant, and far left lateral abdominal wall. A 5 mm port was placed in the right upper quadrant and a 12 mm port was placed in the right lateral abdominal wall for laparoscopic assistance. All ports were placed under direct vision without difficulty. The surgical cart was then docked.   Utilizing the cautery scissors, the bladder was reflected posteriorly allowing entry into the space of Retzius and identification of the endopelvic fascia and prostate. The periprostatic fat was then removed from the prostate allowing full exposure of the endopelvic fascia. The endopelvic fascia was then incised from the apex back to the base of the prostate bilaterally and the underlying levator muscle fibers were swept laterally off the prostate thereby isolating the dorsal venous complex. The dorsal vein was then stapled and divided with a 45 mm Flex Echelon stapler. Attention then turned to the bladder neck which was divided anteriorly thereby allowing entry into the bladder and exposure of the urethral catheter. The catheter balloon was deflated and the catheter was brought into the operative field and used to retract the prostate anteriorly. The posterior bladder neck was then examined and was divided allowing further dissection between the bladder and prostate posteriorly until the vasa deferentia and seminal vessels were identified. The vasa deferentia were isolated, divided, and lifted anteriorly. The seminal vesicles were dissected down to their tips with  care to control the seminal  vascular arterial blood supply. These structures were then lifted anteriorly and the space between Denonvillier's fascia and the anterior rectum was developed with a combination of sharp and blunt dissection. This isolated the vascular pedicles of the prostate.  The lateral prostatic fascia on the left side of the prostate was then sharply incised allowing release of the neurovascular bundle. The vascular pedicle of the prostate on the left side was then ligated with Weck clips between the prostate and neurovascular bundle and divided with sharp cold scissor dissection resulting in neurovascular bundle preservation. On the right side, a wide non nerve sparing dissection was performed with Weck clips used to ligate the vascular pedicle of the prostate. The neurovascular bundle on the left side was then separated off the apex of the prostate and urethra.   The urethra was then sharply transected allowing the prostate specimen to be disarticulated. The pelvis was copiously irrigated and hemostasis was ensured. There was no evidence for rectal injury.  Attention then turned to the right pelvic sidewall. The fibrofatty tissue between the external iliac vein, confluence of the iliac vessels, hypogastric artery, and Cooper's ligament was dissected free from the pelvic sidewall with care to preserve the obturator nerve. Weck clips were used for lymphostasis and hemostasis. An identical procedure was performed on the contralateral side and the lymphatic packets were removed for permanent pathologic analysis.  Attention then turned to the urethral anastomosis. A 2-0 Vicryl slip knot was placed between Denonvillier's fascia, the posterior bladder neck, and the posterior urethra to reapproximate these structures. A double-armed 3-0 Monocryl suture was then used to perform a 360 running tension-free anastomosis between the bladder neck and urethra. A new urethral catheter was then placed into the bladder and irrigated.  There were no blood clots within the bladder and the anastomosis appeared to be watertight. A #19 Blake drain was then brought through the left lateral 8 mm port site and positioned appropriately within the pelvis. It was secured to the skin with a nylon suture. The surgical cart was then undocked. The right lateral 12 mm port site was closed at the fascial level with a 0 Vicryl suture placed laparoscopically. All remaining ports were then removed under direct vision. The prostate specimen was removed intact within the Endopouch retrieval bag via the periumbilical camera port site. This fascial opening was closed with two running 0 PDS sutures. 0.25% Marcaine was then injected into all port sites and all incisions were reapproximated at the skin level with 4-0 Monocryl subcuticular sutures and Dermabond. The patient appeared to tolerate the procedure well and without complications. The patient was able to be extubated and transferred to the recovery unit in satisfactory condition.   Pryor Curia MD

## 2022-02-07 NOTE — Progress Notes (Signed)
Patient ID: Aaron Moore, male   DOB: [birth year], 56 y.o.   MRN: [id number]  Post-op note  Subjective: The patient is doing well.  No complaints.  Still drowsy.  Objective: Vital signs in last 24 hours: Temp:  [97.9 F (36.6 C)-98.7 F (37.1 C)] 98 F (36.7 C) (11/09 1630) Pulse Rate:  [74-97] 74 (11/09 1645) Resp:  [15-20] 19 (11/09 1645) BP: (119-156)/(63-94) 148/86 (11/09 1645) SpO2:  [94 %-100 %] 100 % (11/09 1645) Weight:  [127.5 kg] 127.5 kg (11/09 0958)  Intake/Output from previous day: No intake/output data recorded. Intake/Output this shift: Total I/O In: 2000 [I.V.:2000] Out: 100 [Blood:100]  Physical Exam:  General: Alert and oriented. Abdomen: Soft, Nondistended. Incisions: Clean and dry. GU: Urine clearing.  Lab Results: Recent Labs    02/07/22 1602  HGB 13.1  HCT 40.2    Assessment/Plan: POD#0   1) Continue to monitor, ambulate, IS   Aaron Moore. MD   LOS: 0 days   Aaron Moore 02/07/2022, 4:55 PM

## 2022-02-07 NOTE — Anesthesia Procedure Notes (Signed)
Procedure Name: Intubation Date/Time: 02/07/2022 12:55 PM  Performed by: Lavina Hamman, CRNAPre-anesthesia Checklist: Patient identified, Emergency Drugs available, Suction available, Patient being monitored and Timeout performed Patient Re-evaluated:Patient Re-evaluated prior to induction Oxygen Delivery Method: Circle system utilized Preoxygenation: Pre-oxygenation with 100% oxygen Induction Type: IV induction Ventilation: Mask ventilation without difficulty Laryngoscope Size: Mac and 4 Grade View: Grade II Tube type: Oral Tube size: 7.5 mm Number of attempts: 1 Airway Equipment and Method: Stylet Placement Confirmation: ETT inserted through vocal cords under direct vision, positive ETCO2, CO2 detector and breath sounds checked- equal and bilateral Secured at: 23 cm Tube secured with: Tape Dental Injury: Teeth and Oropharynx as per pre-operative assessment  Difficulty Due To: Difficulty was anticipated, Difficult Airway- due to large tongue, Difficult Airway- due to limited oral opening and Difficult Airway- due to dentition Comments: ATOI.  Large epiglottis.

## 2022-02-07 NOTE — Interval H&P Note (Signed)
History and Physical Interval Note:  02/07/2022 10:23 AM  Aaron Moore  has presented today for surgery, with the diagnosis of PROSTATE CANCER.  The various methods of treatment have been discussed with the patient and family. After consideration of risks, benefits and other options for treatment, the patient has consented to  Procedure(s) with comments: XI ROBOTIC ASSISTED LAPAROSCOPIC RADICAL PROSTATECTOMY LEVEL 3 (N/A) - 210 MINUTES NEEDED FOR CASE BILATERAL PELVIC LYMPHADENECTOMY (Bilateral) as a surgical intervention.  The patient's history has been reviewed, patient examined, no change in status, stable for surgery.  I have reviewed the patient's chart and labs.  Questions were answered to the patient's satisfaction.     [employer]

## 2022-02-08 ENCOUNTER — Encounter (HOSPITAL_COMMUNITY): Payer: Self-pay | Admitting: Urology

## 2022-02-08 DIAGNOSIS — C61 Malignant neoplasm of prostate: Secondary | ICD-10-CM | POA: Diagnosis not present

## 2022-02-08 LAB — HEMOGLOBIN AND HEMATOCRIT, BLOOD
HCT: 41 % (ref 39.0–52.0)
Hemoglobin: 13.6 g/dL (ref 13.0–17.0)

## 2022-02-08 MED ORDER — BISACODYL 10 MG RE SUPP
10.0000 mg | Freq: Once | RECTAL | Status: AC
  Administered 2022-02-08: 10 mg via RECTAL
  Filled 2022-02-08: qty 1

## 2022-02-08 MED ORDER — TRAMADOL HCL 50 MG PO TABS
50.0000 mg | ORAL_TABLET | Freq: Four times a day (QID) | ORAL | Status: DC | PRN
  Administered 2022-02-08: 100 mg via ORAL
  Filled 2022-02-08: qty 2

## 2022-02-08 NOTE — Progress Notes (Signed)
Patient discharging to home today. Patient and Patient's Wife given discharge instructions including post op home Prostatectomy care including Foley and Leg bag care. All Discharge Medications with schedules reviewed with the Patient and his Wife. Understanding verbalized of all discharge teaching and discharge AVS with the Patient at discharge.

## 2022-02-08 NOTE — Progress Notes (Signed)
Patient ID: Aaron Moore, male   DOB: 06/28/1965, 56 y.o.   MRN: [id number]  1 Day Post-Op Subjective: The patient is doing well.  No nausea or vomiting. Pain is adequately controlled.  Objective: Vital signs in last 24 hours: Temp:  [97.9 F (36.6 C)-98.9 F (37.2 C)] 98.7 F (37.1 C) (11/10 0500) Pulse Rate:  [74-97] 94 (11/10 0500) Resp:  [15-20] 20 (11/10 0500) BP: (119-168)/(63-97) 163/97 (11/10 0500) SpO2:  [89 %-100 %] 94 % (11/10 0500) Weight:  [127.5 kg-134.1 kg] 134.1 kg (11/09 1812)  Intake/Output from previous day: 11/09 0701 - 11/10 0700 In: 4415 [I.V.:3365; IV Piggyback:1050] Out: 0623 [Urine:1300; Drains:245; Blood:100] Intake/Output this shift: No intake/output data recorded.  Physical Exam:  General: Alert and oriented. CV: RRR Lungs: Clear bilaterally. GI: Soft, Nondistended. Incisions: Clean, dry, and intact Urine: Clear Extremities: Nontender, no erythema, no edema.  Lab Results: Recent Labs    02/07/22 1602 02/08/22 0434  HGB 13.1 13.6  HCT 40.2 41.0      Assessment/Plan: POD# 1 s/p robotic prostatectomy.  1) SL IVF 2) Ambulate, Incentive spirometry 3) Transition to oral pain medication 4) Dulcolax suppository 5) D/C pelvic drain 6) Plan for likely discharge later today   Pryor Curia. MD   LOS: 0 days   [name] 02/08/2022, 7:50 AM

## 2022-02-08 NOTE — Discharge Summary (Signed)
  Date of admission: 02/07/2022  Date of discharge: 02/08/2022  Admission diagnosis: Prostate Cancer  Discharge diagnosis: Prostate Cancer  History and Physical: For full details, please see admission history and physical. Briefly, Aaron Moore is a 56 y.o. gentleman with localized prostate cancer.  After discussing management/treatment options, he elected to proceed with surgical treatment.  Hospital Course: Aaron Moore was taken to the operating room on 02/07/2022 and underwent a robotic assisted laparoscopic radical prostatectomy. He tolerated this procedure well and without complications. Postoperatively, he was able to be transferred to a regular hospital room following recovery from anesthesia.  He was able to begin ambulating the night of surgery. He remained hemodynamically stable overnight.  He had excellent urine output with appropriately minimal output from his pelvic drain and his pelvic drain was removed on POD #1.  He was transitioned to oral pain medication, tolerated a clear liquid diet, and had met all discharge criteria and was able to be discharged home later on POD#1.  Laboratory values:  Recent Labs    02/07/22 1602 02/08/22 0434  HGB 13.1 13.6  HCT 40.2 41.0    Disposition: Home  Discharge instruction: He was instructed to be ambulatory but to refrain from heavy lifting, strenuous activity, or driving. He was instructed on urethral catheter care.  Discharge medications:   Allergies as of 02/08/2022   No Known Allergies      Medication List     TAKE these medications    amLODipine-valsartan 10-320 MG tablet Commonly known as: EXFORGE Take 1 tablet by mouth daily.   docusate sodium 100 MG capsule Commonly known as: COLACE Take 1 capsule (100 mg total) by mouth 2 (two) times daily.   sulfamethoxazole-trimethoprim 800-160 MG tablet Commonly known as: BACTRIM DS Take 1 tablet by mouth 2 (two) times daily. Start the day prior to foley removal  appointment   traMADol 50 MG tablet Commonly known as: Ultram Take 1-2 tablets (50-100 mg total) by mouth every 6 (six) hours as needed for moderate pain or severe pain.   Wegovy 2.4 MG/0.75ML Soaj Generic drug: Semaglutide-Weight Management Inject 2.4 mg into the skin once weekly. (0.75 mL Subcutaneous weekly 30 day(s))        Followup: He will followup in 1 week for catheter removal and to discuss his surgical pathology results.

## 2022-02-08 NOTE — Progress Notes (Signed)
  Transition of Care [facility]) Screening Note   Patient Details  Name: Orel Cooler Date of Birth: [birth year]   Transition of Care [facility]) CM/SW Contact:    Dessa Phi, RN Phone Number: 02/08/2022, 12:07 PM    Transition of Care Department [facility]) has reviewed patient and no TOC needs have been identified at this time. We will continue to monitor patient advancement through interdisciplinary progression rounds. If new patient transition needs arise, please place a TOC consult.

## 2022-02-12 LAB — SURGICAL PATHOLOGY

## 2022-02-28 ENCOUNTER — Other Ambulatory Visit (HOSPITAL_BASED_OUTPATIENT_CLINIC_OR_DEPARTMENT_OTHER): Payer: Self-pay

## 2022-03-28 ENCOUNTER — Encounter (HOSPITAL_BASED_OUTPATIENT_CLINIC_OR_DEPARTMENT_OTHER): Payer: Self-pay | Admitting: Pharmacist

## 2022-03-28 ENCOUNTER — Other Ambulatory Visit (HOSPITAL_BASED_OUTPATIENT_CLINIC_OR_DEPARTMENT_OTHER): Payer: Self-pay

## 2022-03-28 MED ORDER — WEGOVY 1.7 MG/0.75ML ~~LOC~~ SOAJ
1.7000 mg | SUBCUTANEOUS | 1 refills | Status: DC
  Filled 2022-03-28: qty 3, 28d supply, fill #0

## 2022-04-08 ENCOUNTER — Other Ambulatory Visit (HOSPITAL_BASED_OUTPATIENT_CLINIC_OR_DEPARTMENT_OTHER): Payer: Self-pay

## 2022-04-12 ENCOUNTER — Other Ambulatory Visit (HOSPITAL_BASED_OUTPATIENT_CLINIC_OR_DEPARTMENT_OTHER): Payer: Self-pay

## 2022-04-15 ENCOUNTER — Other Ambulatory Visit (HOSPITAL_BASED_OUTPATIENT_CLINIC_OR_DEPARTMENT_OTHER): Payer: Self-pay

## 2022-04-16 ENCOUNTER — Other Ambulatory Visit (HOSPITAL_BASED_OUTPATIENT_CLINIC_OR_DEPARTMENT_OTHER): Payer: Self-pay

## 2022-05-06 ENCOUNTER — Ambulatory Visit
Admission: RE | Admit: 2022-05-06 | Discharge: 2022-05-06 | Disposition: A | Discharge status: Home / Self Care | Payer: Self-pay | Source: Ambulatory Visit | Attending: Radiation Oncology | Admitting: Radiation Oncology

## 2022-05-06 ENCOUNTER — Other Ambulatory Visit: Payer: Self-pay

## 2022-05-06 DIAGNOSIS — C099 Malignant neoplasm of tonsil, unspecified: Secondary | ICD-10-CM

## 2022-05-07 NOTE — Progress Notes (Incomplete)
Radiation Oncology         [phone] ________________________________  Initial Outpatient Consultation  Name: Aaron Moore MRN: [id number]  Date: 05/08/2022  DOB: 1966-02-06  ID:[id number], Hinton Dyer, DO  Betsey Amen, *   REFERRING PHYSICIAN: Betsey Amen, *  DIAGNOSIS:    ICD-10-CM   1. Squamous cell carcinoma of tonsil (HCC)  C09.9      Squamous cell carcinoma of the right tonsil; p16 positive  CHIEF COMPLAINT: Here to discuss management of tonsillar cancer  HISTORY OF PRESENT ILLNESS::Aaron Moore is a 57 y.o. male who presented  Dr. Stanford Breed [facility] ENT) on 04/26/22 with the chief complaint of worsening right sided sore throat over the course of several months. Per encounter notes, his sore throat remained somewhat stable but increased in severity several weeks prior to this visit. He also reported some recent voice changes and odynophagia, but denied any dysphagia, weight loss or history of smoking. Prior to this visit, the patient completed multiple rounds of abx to address the above symptoms without improvement.    Laryngoscopy performed during this visit revealed an enlarged right tonsil with significant mass effect in the oropharynx /nasopharynx. Bilateral true vocal cord fullness was also appreciated.   Biopsy of the right tonsil collected by Dr. Stanford Breed on 04/26/22 showed small fragments of squamous mucosa showing high-grade dysplasia/squamous cell carcinoma in-situ; p16 positive.  Soft tissue neck CT on 05/04/22 for further evaluation demonstrated the mass within the right oropharynx in the region of the right palatine tonsil as compatible with malignancy, measuring 3.7 x 3.4 cm. CT also showed one single enlarged right cervical chain level 2 lymph node measuring 2.0 x 1.2 cm.   PET scan also performed on 05/04/22 demonstrated the right palatine tonsillar mass as FDG avid (SUV max 16.8), measuring 3.5 x 3.5 cm. PET also showed the right level 2 node  concerning for nodal metastasis as FDG avid (SUV max 3.8).    Swallowing issues, if any: sore throat, odynophagia, denies any dysphagia   Weight Changes: none  Pain status: pain with swallowing as noted above  Other symptoms: none  Tobacco history, if any: never smoker  ETOH abuse, if any: does not consume alcohol  Prior cancers, if any: Clinically localized adenocarcinoma of the prostate (clinical stage T1c N0 M)) - s/p radical prostatectomy and bilateral pelvic lymphadenectomy on 02/07/2022 with Dr. Alinda Money. Final pathology from the procedure showed adenocarcinoma of the prostate with extraprostatic extension present at right anterolateral and  posterolateral mid and base. All margins negative, and all resected pelvic lymph nodes were negative.      PREVIOUS RADIATION THERAPY: No  PAST MEDICAL HISTORY:  has a past medical history of Abdominal pain, Cancer ([place]), Complication of anesthesia, ED (erectile dysfunction), Gout, Gout, History of kidney stones, Hypertension, Obesity, Class II, BMI 35-39.9, with comorbidity, and Painful orthopaedic hardware ([place]).    PAST SURGICAL HISTORY: Past Surgical History:  Procedure Laterality Date   amputation of 2 toes on left foot      CARPAL TUNNEL RELEASE  05/03/2011   Procedure: CARPAL TUNNEL RELEASE;  Surgeon: Wynonia Sours, MD;  Location: [place];  Service: Orthopedics;  Laterality: Left;   FOOT FRACTURE SURGERY Left    x3   HARDWARE REMOVAL Left 06/03/2019   Procedure: HARDWARE REMOVAL;  Surgeon: Erle Crocker, MD;  Location: [place];  Service: Orthopedics;  Laterality: Left;  PROCEDURE: LEFT FOOT REMOVAL OF HARDWARE, IRRIGATION AND DEBRIDEMENT,PLACEMENT OF CEMENT SPACER, DEBULKIN, SOFT  TISSUE MASS LENGTH OF SURGERY: 2 HOURS   HERNIA REPAIR  02/06/2009   Lap supraumb & umb VWH repairs   INCISION AND DRAINAGE OF WOUND Left 06/03/2019   Procedure: IRRIGATION AND DEBRIDEMENT WOUND WITH IMPLANTATION OF  ANTIBIOTIC CEMENT SPACER;  Surgeon: Erle Crocker, MD;  Location: [place];  Service: Orthopedics;  Laterality: Left;   left rotator cuff surgery      LYMPHADENECTOMY Bilateral 02/07/2022   Procedure: BILATERAL PELVIC LYMPHADENECTOMY;  Surgeon: Raynelle Bring, MD;  Location: [facility];  Service: Urology;  Laterality: Bilateral;   MASS EXCISION Left 06/03/2019   Procedure: DEBULKING LEFT FOOT MASS;  Surgeon: Erle Crocker, MD;  Location: [place];  Service: Orthopedics;  Laterality: Left;   ROBOT ASSISTED LAPAROSCOPIC RADICAL PROSTATECTOMY N/A 02/07/2022   Procedure: XI ROBOTIC ASSISTED LAPAROSCOPIC RADICAL PROSTATECTOMY LEVEL 3;  Surgeon: Raynelle Bring, MD;  Location: [facility];  Service: Urology;  Laterality: N/A;  210 MINUTES NEEDED FOR CASE    FAMILY HISTORY: family history includes Diabetes in his father and mother.  SOCIAL HISTORY:  reports that he has never smoked. He has never used smokeless tobacco. He reports that he does not drink alcohol and does not use drugs.  ALLERGIES: Patient has no known allergies.  MEDICATIONS:  Current Outpatient Medications  Medication Sig Dispense Refill   amLODipine-valsartan (EXFORGE) 10-320 MG tablet Take 1 tablet by mouth daily.     docusate sodium (COLACE) 100 MG capsule Take 1 capsule (100 mg total) by mouth 2 (two) times daily.     Semaglutide-Weight Management (WEGOVY) 1.7 MG/0.75ML SOAJ Inject 1.7 mg into the skin once a week. 3 mL 1   Semaglutide-Weight Management (WEGOVY) 2.4 MG/0.75ML SOAJ 0.75 mL Subcutaneous weekly 30 day(s) 3 mL 5   sulfamethoxazole-trimethoprim (BACTRIM DS) 800-160 MG tablet Take 1 tablet by mouth 2 (two) times daily. Start the day prior to foley removal appointment 6 tablet 0   traMADol (ULTRAM) 50 MG tablet Take 1-2 tablets (50-100 mg total) by mouth every 6 (six) hours as needed for moderate pain or severe pain. 20 tablet 0   No current facility-administered medications for  this encounter.    REVIEW OF SYSTEMS:  Notable for that above.   PHYSICAL EXAM:  vitals were not taken for this visit.   General: Alert and oriented, in no acute distress HEENT: Head is normocephalic. Extraocular movements are intact. Oropharynx is notable for ***. Neck: Neck is notable for *** Heart: Regular in rate and rhythm with no murmurs, rubs, or gallops. Chest: Clear to auscultation bilaterally, with no rhonchi, wheezes, or rales. Abdomen: Soft, nontender, nondistended, with no rigidity or guarding. Extremities: No cyanosis or edema. Lymphatics: see Neck Exam Skin: No concerning lesions. Musculoskeletal: symmetric strength and muscle tone throughout. Neurologic: Cranial nerves II through XII are grossly intact. No obvious focalities. Speech is fluent. Coordination is intact. Psychiatric: Judgment and insight are intact. Affect is appropriate.   ECOG = ***  0 - Asymptomatic (Fully active, able to carry on all predisease activities without restriction)  1 - Symptomatic but completely ambulatory (Restricted in physically strenuous activity but ambulatory and able to carry out work of a light or sedentary nature. For example, light housework, office work)  2 - Symptomatic, <50% in bed during the day (Ambulatory and capable of all self care but unable to carry out any work activities. Up and about more than 50% of waking hours)  3 - Symptomatic, >50% in bed, but not bedbound (Capable  of only limited self-care, confined to bed or chair 50% or more of waking hours)  4 - Bedbound (Completely disabled. Cannot carry on any self-care. Totally confined to bed or chair)  5 - Death   Eustace Pen MM, Creech RH, Tormey DC, et al. [phone]). "Toxicity and response criteria of the [facility]". [place] Oncol. 5 (6): 649-55   LABORATORY DATA:  Lab Results  Component Value Date   WBC 9.9 02/04/2022   HGB 13.6 02/08/2022   HCT 41.0 02/08/2022   MCV 88.2 02/04/2022    PLT 239 02/04/2022   CMP     Component Value Date/Time   NA 140 02/04/2022 0832   K 3.5 02/04/2022 0832   CL 104 02/04/2022 0832   CO2 28 02/04/2022 0832   GLUCOSE 90 02/04/2022 0832   BUN 15 02/04/2022 0832   CREATININE 1.08 02/04/2022 0832   CALCIUM 9.0 02/04/2022 0832   PROT 7.4 10/21/2017 2005   ALBUMIN 4.2 10/21/2017 2005   AST 29 10/21/2017 2005   ALT 37 10/21/2017 2005   ALKPHOS 82 10/21/2017 2005   BILITOT 0.6 10/21/2017 2005   GFRNONAA >60 02/04/2022 0832   GFRAA >60 10/21/2017 2005      No results found for: "TSH"   RADIOGRAPHY: No results found.    IMPRESSION/PLAN:  This is a delightful patient with head and neck cancer. I *** recommend radiotherapy for this patient.  We discussed the potential risks, benefits, and side effects of radiotherapy. We talked in detail about acute and late effects. We discussed that some of the most bothersome acute effects may be mucositis, dysgeusia, salivary changes, skin irritation, hair loss, dehydration, weight loss and fatigue. We talked about late effects which include but are not necessarily limited to dysphagia, hypothyroidism, nerve injury, vascular injury, spinal cord injury, xerostomia, trismus, neck edema, and potential injury to any of the tissues in the head and neck region. No guarantees of treatment were given. A consent form was signed and placed in the patient's medical record. The patient is enthusiastic about proceeding with treatment. I look forward to participating in the patient's care.    Simulation (treatment planning) will take place ***  We also discussed that the treatment of head and neck cancer is a multidisciplinary process to maximize treatment outcomes and quality of life. For this reason the following referrals have been or will be made:  *** Medical oncology to discuss chemotherapy   *** Dentistry for dental evaluation, possible extractions in the radiation fields, and /or advice on reducing risk of  cavities, osteoradionecrosis, or other oral issues.  *** Nutritionist for nutrition support during and after treatment.  *** Speech language pathology for swallowing and/or speech therapy.  *** Social work for social support.   *** Physical therapy due to risk of lymphedema in neck and deconditioning.  *** Baseline labs including TSH.  On date of service, in total, I spent *** minutes on this encounter. Patient was seen in person.  __________________________________________   Eppie Gibson, MD  This document serves as a record of services personally performed by Eppie Gibson, MD. It was created on her behalf by Roney Mans, a trained medical scribe. The creation of this record is based on the scribe's personal observations and the provider's statements to them. This document has been checked and approved by the attending provider.

## 2022-05-07 NOTE — Progress Notes (Signed)
Head and Neck Cancer Location of Tumor / Histology:  Squamous cell carcinoma of tonsil   Patient presented with symptoms of:   Biopsies revealed:     05/06/2022 11:35 AM EST  PROCEDURE: F-18 FDG PET/CT  scan from the skull base to the mid thighs.  INDICATION: Malignant neoplasm of pharynx, unspecified (#).   Initial treatment strategy.  TECHNIQUE: PET/CT  imaging was performed from the skull base to the mid thighs using routine PET acquisition following evaluation of serum glucose level and intravenous administration of F-18 FDG, per standard protocol. A CT scan was performed for localization and attenuation correction purposes only and is not intended for diagnosis separate from the PET scan.  Radiopharmaceutical: 13 mCi of F-18 FDG, intravenously. Blood Glucose level prior to FDG injection: 106 mg/dL. Time from injection to imaging: 66 minutes.  COMPARISON: CT neck May 04, 2022  FINDINGS:  Reference Liver SUV max: 3.5  HEAD/FACE: Physiologic FDG uptake is seen in the visualized regions of the brain, large salivary glands and oropharynx.  NECK: FDG avid right palatine tonsillar mass measuring 3.5 x 3.5 cm (SUV max 16.8) (series 3 image 48), previously 3.7 x 3.4 cm. Stable size FDG avid right level 2 node measuring 2.0 x 1.2 cm (SUV max 3.8) (series 3 image 55), previously 2.0 x 1.2 cm. Symmetric diffuse uptake in the cricoarytenoid musculature.  CHEST: Physiologic FDG avidity is seen in mediastinal blood pool, myocardium.  LUNGS: No abnormal uptake.  PLEURA/PERICARDIUM: No abnormal uptake.  THORACIC NODES: No abnormal uptake.  HEPATOBILIARY: No abnormal uptake. Hepatomegaly measuring 21.5 cm.  SPLEEN: No abnormal uptake.  PANCREAS: No abnormal uptake.  ADRENAL GLANDS: No abnormal uptake.  KIDNEYS/URETERS/BLADDER: Calculus in the left distal ureter measuring 0.3 cm (series 3 image 187) with adjacent fat stranding. Otherwise, punctate nonobstructing left renal  calculi, largest in the upper pole measuring 0.4 cm. Intense uptake throughout the left kidney.  ABDOMINOPELVIC NODES: No abnormal uptake.  VASCULATURE: Unremarkable.  BOWEL/PERITONEUM/MESENTERY: No abnormal uptake. Unremarkable appendix. Tiny hiatal hernia.  PELVIC ORGANS: No abnormal uptake.  BONES/SOFT TISSUES: Small fat-containing bilateral inguinal hernias.Mild degenerative changes in the visualized spine.  OTHER FINDINGS: None.     Imaging Results - PET-CT Skull Base to Thighs Initial (05/04/2022 10:44 AM EST) Resulting Agency Comment  [id number]    Imaging Results - PET-CT Skull Base to Thighs Initial (05/04/2022 10:44 AM EST) Procedure Note  Charleston Poot, MD - 05/06/2022  Formatting of this note might be different from the original. PROCEDURE: F-18 FDG PET/CT scan from the skull base to the mid thighs.  INDICATION: Malignant neoplasm of pharynx, unspecified (#). Initial treatment strategy.  TECHNIQUE: PET/CT imaging was performed from the skull base to the mid thighs using routine PET acquisition following evaluation of serum glucose level and intravenous administration of F-18 FDG, per standard protocol. A CT scan was performed for localization and attenuation correction purposes only and is not intended for diagnosis separate from the PET scan.  Radiopharmaceutical: 13 mCi of F-18 FDG, intravenously. Blood Glucose level prior to FDG injection: 106 mg/dL. Time from injection to imaging: 66 minutes.  COMPARISON: CT neck May 04, 2022  FINDINGS:  Reference Liver SUV max: 3.5  HEAD/FACE: Physiologic FDG uptake is seen in the visualized regions of the brain, large salivary glands and oropharynx.  NECK: FDG avid right palatine tonsillar mass measuring 3.5 x 3.5 cm (SUV max 16.8) (series 3 image 48), previously 3.7 x 3.4 cm. Stable size FDG avid right level 2 node measuring 2.0  x 1.2 cm (SUV max 3.8) (series 3 image 55), previously 2.0 x 1.2 cm. Symmetric diffuse  uptake in the cricoarytenoid musculature.  CHEST: Physiologic FDG avidity is seen in mediastinal blood pool, myocardium.  LUNGS: No abnormal uptake.  PLEURA/PERICARDIUM: No abnormal uptake.  THORACIC NODES: No abnormal uptake.  HEPATOBILIARY: No abnormal uptake. Hepatomegaly measuring 21.5 cm.  SPLEEN: No abnormal uptake.  PANCREAS: No abnormal uptake.  ADRENAL GLANDS: No abnormal uptake.  KIDNEYS/URETERS/BLADDER: Calculus in the left distal ureter measuring 0.3 cm (series 3 image 187) with adjacent fat stranding. Otherwise, punctate nonobstructing left renal calculi, largest in the upper pole measuring 0.4 cm. Intense uptake throughout the left kidney.  ABDOMINOPELVIC NODES: No abnormal uptake.  VASCULATURE: Unremarkable.  BOWEL/PERITONEUM/MESENTERY: No abnormal uptake. Unremarkable appendix. Tiny hiatal hernia.  PELVIC ORGANS: No abnormal uptake.  BONES/SOFT TISSUES: Small fat-containing bilateral inguinal hernias.Mild degenerative changes in the visualized spine.  OTHER FINDINGS: None.   IMPRESSION: 1. FDG avid stable size right pontine tonsillar known malignancy with right nodal metastasis. 2. Calculus in the left distal ureter with adjacent inflammatory changes and with intense uptake in the left kidney, probably pyelonephritis and ureteritis, correlate with urinalysis.     Lexicon associated numeric estimate of certainty. Consistent with > 90% Suspicious for/Probable/Probably ~75% Possible/Possibly ~50% Less likely ~25% Unlikely < 10%  Reference: [email or web address]  Electronically Signed by: Charleston Poot on 05/06/2022 11:35 AM   Imaging Results - PET-CT Skull Base to Thighs Initial (05/04/2022 10:44 AM EST) Authorizing Provider Result Type  Ceasar Mons MD IMG PET ORDERABLES     Nutrition Status Yes No Comments  Weight changes? '[x]'$  '[]'$  Trying to lose weight  Swallowing concerns? '[x]'$  '[]'$  Difficult for about 6 months  PEG?  '[]'$  '[x]'$     Referrals Yes No Comments  Social Work? '[]'$  '[x]'$    Dentistry? '[x]'$  '[]'$  October saw dentist  Swallowing therapy? '[]'$  '[x]'$    Nutrition? '[]'$  '[x]'$    Med/Onc? '[]'$  '[x]'$  To see soon   Safety Issues Yes No Comments  Prior radiation? '[]'$  '[x]'$    Pacemaker/ICD? '[]'$  '[x]'$    Possible current pregnancy? '[]'$  '[x]'$    Is the patient on methotrexate? '[]'$  '[x]'$     Tobacco/Marijuana/Snuff/ETOH use: no  Past/Anticipated interventions by otolaryngology, if any:    Past/Anticipated interventions by medical oncology, if any: To see Dr. Chryl Heck on 05-08-22  Current Complaints / other details: wants to know plan, what it is overall and where??   Vitals:   05/08/22 0813  BP: 125/66  Pulse: 87  Resp: 20  Temp: 97.6 F (36.4 C)  SpO2: 99%

## 2022-05-08 ENCOUNTER — Ambulatory Visit
Admission: RE | Admit: 2022-05-08 | Discharge: 2022-05-08 | Disposition: A | Discharge status: Home / Self Care | Payer: 59 | Source: Ambulatory Visit | Attending: Radiation Oncology | Admitting: Radiation Oncology

## 2022-05-08 ENCOUNTER — Inpatient Hospital Stay: Payer: 59

## 2022-05-08 ENCOUNTER — Encounter: Payer: Self-pay | Admitting: Radiation Oncology

## 2022-05-08 ENCOUNTER — Other Ambulatory Visit: Payer: Self-pay

## 2022-05-08 ENCOUNTER — Inpatient Hospital Stay: Payer: 59 | Attending: Hematology and Oncology | Admitting: Hematology and Oncology

## 2022-05-08 ENCOUNTER — Telehealth: Payer: Self-pay

## 2022-05-08 VITALS — BP 125/66 | HR 87 | Temp 97.6°F | Resp 20 | Ht 73.0 in | Wt 275.4 lb

## 2022-05-08 VITALS — BP 127/65 | HR 81 | Temp 97.3°F | Resp 16 | Wt 275.2 lb

## 2022-05-08 DIAGNOSIS — N200 Calculus of kidney: Secondary | ICD-10-CM

## 2022-05-08 DIAGNOSIS — Z79899 Other long term (current) drug therapy: Secondary | ICD-10-CM | POA: Insufficient documentation

## 2022-05-08 DIAGNOSIS — C090 Malignant neoplasm of tonsillar fossa: Secondary | ICD-10-CM

## 2022-05-08 DIAGNOSIS — C099 Malignant neoplasm of tonsil, unspecified: Secondary | ICD-10-CM

## 2022-05-08 DIAGNOSIS — R4781 Slurred speech: Secondary | ICD-10-CM | POA: Diagnosis not present

## 2022-05-08 DIAGNOSIS — Z87442 Personal history of urinary calculi: Secondary | ICD-10-CM | POA: Insufficient documentation

## 2022-05-08 DIAGNOSIS — R1310 Dysphagia, unspecified: Secondary | ICD-10-CM | POA: Diagnosis not present

## 2022-05-08 DIAGNOSIS — Z8546 Personal history of malignant neoplasm of prostate: Secondary | ICD-10-CM | POA: Insufficient documentation

## 2022-05-08 DIAGNOSIS — Z9079 Acquired absence of other genital organ(s): Secondary | ICD-10-CM | POA: Insufficient documentation

## 2022-05-08 DIAGNOSIS — I10 Essential (primary) hypertension: Secondary | ICD-10-CM | POA: Diagnosis not present

## 2022-05-08 LAB — CMP (CANCER CENTER ONLY)
ALT: 71 U/L — ABNORMAL HIGH (ref 0–44)
AST: 22 U/L (ref 15–41)
Albumin: 2.3 g/dL — ABNORMAL LOW (ref 3.5–5.0)
Alkaline Phosphatase: 103 U/L (ref 38–126)
Anion gap: 9 (ref 5–15)
BUN: 26 mg/dL — ABNORMAL HIGH (ref 6–20)
CO2: 24 mmol/L (ref 22–32)
Calcium: 7.9 mg/dL — ABNORMAL LOW (ref 8.9–10.3)
Chloride: 106 mmol/L (ref 98–111)
Creatinine: 1.48 mg/dL — ABNORMAL HIGH (ref 0.61–1.24)
GFR, Estimated: 55 mL/min — ABNORMAL LOW (ref >60)
Glucose, Bld: 119 mg/dL — ABNORMAL HIGH (ref 70–99)
Potassium: 4.2 mmol/L (ref 3.5–5.1)
Sodium: 139 mmol/L (ref 135–145)
Total Bilirubin: 0.6 mg/dL (ref 0.3–1.2)
Total Protein: 6.2 g/dL — ABNORMAL LOW (ref 6.5–8.1)

## 2022-05-08 NOTE — Progress Notes (Signed)
[place] NOTE  Patient Care Team: Aaron Morning, DO as PCP - General (Family Medicine) Stanford Breed, Keenan Bachelor, MD as Referring Physician (Otolaryngology) Eppie [place], MD as Consulting Physician (Radiation Oncology) Benay Pike, MD as Consulting Physician (Hematology and Oncology) Malmfelt, Stephani Police, RN as Oncology Nurse Navigator  CHIEF COMPLAINTS/PURPOSE OF CONSULTATION:  SCC tonsil.  ASSESSMENT & PLAN:   This is a very pleasant 57 yr old male patient with HTN, Prostate cancer referred to ENT oncology for new diagnosis of SCC tonsil, P16. He first noticed some sore throat, difficulty swallowing and some slurred speech and was investigated for the same. PE demonstrated right tonsillar mass on exam, some slurred speech, no definitive palpable LN. We reviewed his imaging in our TB and the recommendation was to consider CRT since the size of the tonsillar mass is estimated at just over 4 cms. I today discussed about chemotherapy with weekly cisplatin. We discussed the mechanism of action including but not limited to adverse effects including fatigue, nausea, cytopenias, ototoxicity, neuropathy and nephropathy. He understands that some of these side effects can be life threatening and permanent. He was agreeable to the treatment. We discussed the intent of treatment which is curative. We discussed about no further biopsy because clinically and radiologically the presentation is consistent with invasive SCC and not just in situ SCC. I discussed about this as well with the patient today. He is agreeable.  He will start chemoradiation after dental surgery and proper healing. Repeat lab showed GFR of 55, so can consider dosing at 75%. If there is any further creatinine impairment while on cisplatin, we can consider radiation alone.  Benay Pike MD    HISTORY OF PRESENTING ILLNESS:  Aaron Moore 57 y.o. male is here because of SCC Tonsil  This is a very  pleasant 57 yr old male patient with PMH significant for prostate cancer, amputation of toe, HTN , obesity referred to Head and neck oncology because of his new diagnosis of SCC tonsil. Many months ago, patient noticed some hoarseness and sore throat. He was referred to ENT doctor. He had a biopsy while he was in office. Biopsy showed small fragments of squamous mucosa concerning for SCC in situ. He then had PET CT imaging which showed right avid tonsillar lesion and right nodal metastasis. He was reviewed in our H and N tumor board and the size of the tonsillar lesion is estimated to be above 4 cms. Given size of tumor greater than 4 cms, and positive LN, we have discussed that its best to proceed with concurrent chemoradiation. He is here with his wife today.  He says lately he has been going through a lot but feels motivated to move on with treatment. He denies any history of smoking, currently working on a farm. Rest of the pertinent 10 point ROS reviewed and negative.   MEDICAL HISTORY:  Past Medical History:  Diagnosis Date   Abdominal pain    Cancer ([place])    prostate cancer   Complication of anesthesia    slow to wake up 3 years ago after foot surgery   ED (erectile dysfunction)    Gout    Gout    History of kidney stones    Hypertension    Obesity, Class II, BMI 35-39.9, with comorbidity    Painful orthopaedic hardware ([place])    left foot    SURGICAL HISTORY: Past Surgical History:  Procedure Laterality Date   amputation of 2 toes on left foot  CARPAL TUNNEL RELEASE  05/03/2011   Procedure: CARPAL TUNNEL RELEASE;  Surgeon: Wynonia Sours, MD;  Location: [facility];  Service: Orthopedics;  Laterality: Left;   FOOT FRACTURE SURGERY Left    x3   HARDWARE REMOVAL Left 06/03/2019   Procedure: HARDWARE REMOVAL;  Surgeon: Erle Crocker, MD;  Location: [facility];  Service: Orthopedics;  Laterality: Left;  PROCEDURE: LEFT FOOT REMOVAL OF  HARDWARE, IRRIGATION AND DEBRIDEMENT,PLACEMENT OF CEMENT SPACER, DEBULKIN, SOFT TISSUE MASS LENGTH OF SURGERY: 2 HOURS   HERNIA REPAIR  02/06/2009   Lap supraumb & umb VWH repairs   INCISION AND DRAINAGE OF WOUND Left 06/03/2019   Procedure: IRRIGATION AND DEBRIDEMENT WOUND WITH IMPLANTATION OF ANTIBIOTIC CEMENT SPACER;  Surgeon: Erle Crocker, MD;  Location: [place];  Service: Orthopedics;  Laterality: Left;   left rotator cuff surgery      LYMPHADENECTOMY Bilateral 02/07/2022   Procedure: BILATERAL PELVIC LYMPHADENECTOMY;  Surgeon: Raynelle Bring, MD;  [facility];  Service: Urology;  Laterality: Bilateral;   MASS EXCISION Left 06/03/2019   Procedure: DEBULKING LEFT FOOT MASS;  Surgeon: Erle Crocker, MD;  Location: [place];  Service: Orthopedics;  Laterality: Left;   ROBOT ASSISTED LAPAROSCOPIC RADICAL PROSTATECTOMY N/A 02/07/2022   Procedure: XI ROBOTIC ASSISTED LAPAROSCOPIC RADICAL PROSTATECTOMY LEVEL 3;  Surgeon: Raynelle Bring, MD;  [facility];  Service: Urology;  Laterality: N/A;  210 MINUTES NEEDED FOR CASE    SOCIAL HISTORY: Social History   Socioeconomic History   Marital status: Married    Spouse name: Not on file   Number of children: Not on file   Years of education: Not on file   Highest education level: Not on file  Occupational History   Not on file  Tobacco Use   Smoking status: Never   Smokeless tobacco: Never  Vaping Use   Vaping Use: Never used  Substance and Sexual Activity   Alcohol use: No   Drug use: No   Sexual activity: Not on file  Other Topics Concern   Not on file  Social History Narrative   Not on file   Social Determinants of Health   Financial Resource Strain: Not on file  Food Insecurity: No Food Insecurity (02/07/2022)   Hunger Vital Sign    Worried About Running Out of Food in the Last Year: Never true    Ran Out of Food in the Last Year: Never true  Transportation Needs: No  Transportation Needs (02/07/2022)   PRAPARE - Hydrologist (Medical): No    Lack of Transportation (Non-Medical): No  Physical Activity: Not on file  Stress: Not on file  Social Connections: Not on file  Intimate Partner Violence: Not At Risk (02/07/2022)   Humiliation, Afraid, Rape, and Kick questionnaire    Fear of Current or Ex-Partner: No    Emotionally Abused: No    Physically Abused: No    Sexually Abused: No    FAMILY HISTORY: Family History  Problem Relation Age of Onset   Diabetes Mother    Diabetes Father     ALLERGIES:  has No Known Allergies.  MEDICATIONS:  Current Outpatient Medications  Medication Sig Dispense Refill   amLODipine-valsartan (EXFORGE) 10-320 MG tablet Take 1 tablet by mouth daily.     docusate sodium (COLACE) 100 MG capsule Take 1 capsule (100 mg total) by mouth 2 (two) times daily.     Semaglutide-Weight Management (WEGOVY) 1.7 MG/0.75ML  SOAJ Inject 1.7 mg into the skin once a week. 3 mL 1   Semaglutide-Weight Management (WEGOVY) 2.4 MG/0.75ML SOAJ 0.75 mL Subcutaneous weekly 30 day(s) 3 mL 5   sulfamethoxazole-trimethoprim (BACTRIM DS) 800-160 MG tablet Take 1 tablet by mouth 2 (two) times daily. Start the day prior to foley removal appointment 6 tablet 0   traMADol (ULTRAM) 50 MG tablet Take 1-2 tablets (50-100 mg total) by mouth every 6 (six) hours as needed for moderate pain or severe pain. 20 tablet 0   No current facility-administered medications for this visit.    PHYSICAL EXAMINATION: ECOG PERFORMANCE STATUS: 0 - Asymptomatic  Vitals:   05/08/22 1400  BP: 127/65  Pulse: 81  Resp: 16  Temp: (!) 97.3 F (36.3 C)  SpO2: 100%   Filed Weights   05/08/22 1400  Weight: 275 lb 3 oz (124.8 kg)    GENERAL:alert, no distress and comfortable SKIN: skin color, texture, turgor are normal, no rashes or significant lesions EYES: normal, conjunctiva are pink and non-injected, sclera clear OROPHARYNX: large right  tonsillar mass noted. NECK: supple, thyroid normal size, non-tender, without nodularity LYMPH:  no palpable lymphadenopathy in the cervical, axillary LUNGS: clear to auscultation and percussion with normal breathing effort HEART: regular rate & rhythm and no murmurs and no lower extremity edema ABDOMEN:abdomen soft, non-tender and normal bowel sounds Musculoskeletal:no cyanosis of digits and no clubbing  PSYCH: alert & oriented x 3 with fluent speech NEURO: no focal motor/sensory deficits  LABORATORY DATA:  I have reviewed the data as listed Lab Results  Component Value Date   WBC 9.9 02/04/2022   HGB 13.6 02/08/2022   HCT 41.0 02/08/2022   MCV 88.2 02/04/2022   PLT 239 02/04/2022     Chemistry      Component Value Date/Time   NA 140 02/04/2022 0832   K 3.5 02/04/2022 0832   CL 104 02/04/2022 0832   CO2 28 02/04/2022 0832   BUN 15 02/04/2022 0832   CREATININE 1.08 02/04/2022 0832      Component Value Date/Time   CALCIUM 9.0 02/04/2022 0832   ALKPHOS 82 10/21/2017 2005   AST 29 10/21/2017 2005   ALT 37 10/21/2017 2005   BILITOT 0.6 10/21/2017 2005       RADIOGRAPHIC STUDIES: I have personally reviewed the radiological images as listed and agreed with the findings in the report. No results found.  All questions were answered. The patient knows to call the clinic with any problems, questions or concerns. I spent 60 minutes in the care of this patient including H and P, review of records, counseling and coordination of care.     Benay Pike, MD 05/08/2022 2:07 PM

## 2022-05-08 NOTE — Telephone Encounter (Signed)
Rn Anderson Malta called [facility] to get pt a referral appointment per Dr. Isidore Moos request based on PET scan concerns. An appointment was made for June 12, 2022. This was the earliest that Dr. Alinda Money had available. Rn will call pt with this appointment.

## 2022-05-08 NOTE — Progress Notes (Signed)
Oncology Nurse Navigator Documentation   Met with patient during initial consult with Dr. Isidore Moos. He was accompanied by his wife, Guerry Minors. I introduced myself as his/their Navigator, explained my role as a member of the Care Team. Provided New Patient resource guide binder: Contact information for physicians, this navigator, other members of the Care Team Advance Directive information; provided [facility] AD booklet at their request,  Fall Prevention Patient [place] Information sheet Symptom Management Clinic information [place] campus map with highlight of [place] [facility] Information sheet Head and Neck cancer basics Nutrition information Patient and family support information including Spiritual care/Chaplain information, Peer mentor program, health and wellness classes, and the survivorship program Community resources  Provided and discussed educational handouts for PEG and PAC. Assisted with post-consult appt scheduling. I placed a referral to Dr. Frederik Schmidt to evaluate Mr. Bailon know loose wisdom teeth before he will start radiation.  They verbalized understanding of information provided. I encouraged them to call with questions/concerns moving forward.  [name], RN, BSN, OCN Head & Neck Oncology Nurse [place] at [place] [phone]

## 2022-05-08 NOTE — Progress Notes (Signed)
Oncology Nurse Navigator Documentation   Met with patient during initial consult with Dr. Chryl Heck. He was accompanied by his wife. Further introduced myself as his/their Navigator, explained my role as a member of the Care Team. They verbalized understanding of information provided. I encouraged them to call with questions/concerns moving forward.  [name], RN, BSN, OCN Head & Neck Oncology Nurse [place] at [place] [phone]

## 2022-05-08 NOTE — Telephone Encounter (Signed)
Voicemail left for pt about details of urology appointment requested per Dr. Isidore Moos. This appointment is at 1030am on June 12, 2022 (This was the earliest appointment available with Dr. Alinda Money).

## 2022-05-09 LAB — TSH: TSH: 2.023 u[IU]/mL (ref 0.350–4.500)

## 2022-05-10 ENCOUNTER — Encounter: Payer: Self-pay | Admitting: Hematology and Oncology

## 2022-05-10 ENCOUNTER — Encounter: Payer: Self-pay | Admitting: Radiation Oncology

## 2022-05-10 ENCOUNTER — Encounter: Payer: Self-pay | Admitting: General Practice

## 2022-05-10 NOTE — Progress Notes (Signed)
Oncology Nurse Navigator Documentation   Dr. Benson Norway requested a radiation dose outline for Aaron Moore. Dr. Isidore Moos provided it and I faxed and emailed it to Dr. Benson Norway. I did receive notification of successful fax transmission.   [name] RN, BSN, OCN Head & Neck Oncology Nurse [place] at [facility] Phone # [phone]  Fax # [phone]

## 2022-05-10 NOTE — Progress Notes (Signed)
[place] Spiritual Care Note  Reached out to Mr Weigold by phone per referral from nursing. Reached his wife Guerry Minors, providing caregiver support and introduction to [employer] support resources. Plan to phone Mr Pensinger at his mobile number per her encouragement. She also has direct Spiritual Care number for follow-up conversations.   [place], [place], [facility] Pager [phone] Voicemail [phone]

## 2022-05-10 NOTE — Progress Notes (Addendum)
Dental Form with Estimates of Radiation Dose      Diagnosis:   Cancer Staging  Cancer of tonsillar fossa ([place]) Staging form: Pharynx - HPV-Mediated Oropharynx, AJCC 8th Edition - Clinical stage from 05/08/2022: Stage II (cT3, cN1, cM0, p16+) - Signed by Eppie [place], MD on 05/08/2022 Stage prefix: Initial diagnosis   Prognosis: curative  Anticipated # of fractions: 35    Daily?: yes  # of weeks of radiotherapy: 7  Chemotherapy?: yes  Anticipated xerostomia:  Mild permanent   Simulation: ASAP   Other Notes: Will keep 50Gy line off all roots, but pt has at least one loose tooth (posterior right maxillary molar - please address.)   Please contact Eppie [facility], MD, with patient's disposition after evaluation and/or dental treatment. Thanks!

## 2022-05-10 NOTE — Progress Notes (Signed)
[place] Spiritual Care Note  Left voicemail at Mr Deese's mobile number per his wife's encouragement (her cell is listed as his home number). Encouraged return call.   [place], [place], [facility] Pager [phone] Voicemail [phone]

## 2022-05-13 ENCOUNTER — Other Ambulatory Visit: Payer: Self-pay | Admitting: Hematology and Oncology

## 2022-05-13 DIAGNOSIS — C090 Malignant neoplasm of tonsillar fossa: Secondary | ICD-10-CM

## 2022-05-13 MED ORDER — DEXAMETHASONE 4 MG PO TABS: ORAL_TABLET | ORAL | 1 refills | Status: DC

## 2022-05-13 MED ORDER — ONDANSETRON HCL 8 MG PO TABS: 8.0000 mg | ORAL_TABLET | Freq: Three times a day (TID) | ORAL | 1 refills | Status: DC | PRN

## 2022-05-13 MED ORDER — LIDOCAINE-PRILOCAINE 2.5-2.5 % EX CREA: TOPICAL_CREAM | CUTANEOUS | 3 refills | Status: DC

## 2022-05-13 MED ORDER — PROCHLORPERAZINE MALEATE 10 MG PO TABS: 10.0000 mg | ORAL_TABLET | Freq: Four times a day (QID) | ORAL | 1 refills | Status: DC | PRN

## 2022-05-13 NOTE — Progress Notes (Signed)
START ON PATHWAY REGIMEN - Head and Neck     A cycle is every 7 days:     Cisplatin   **Always confirm dose/schedule in your pharmacy ordering system**  Patient Characteristics: Oropharynx, HPV Positive, Preoperative or Nonsurgical Candidate (Clinical Staging), cT0-4, cN1-3 or cT3-4, cN0 Disease Classification: Oropharynx HPV Status: Positive (+) Therapeutic Status: Preoperative or Nonsurgical Candidate (Clinical Staging) AJCC T Category: cT3 AJCC 8 Stage Grouping: II AJCC N Category: cN1 AJCC M Category: cM0 Intent of Therapy: Curative Intent, Discussed with Patient

## 2022-05-14 ENCOUNTER — Ambulatory Visit: Payer: 59 | Admitting: Radiation Oncology

## 2022-05-14 ENCOUNTER — Other Ambulatory Visit: Payer: Self-pay | Admitting: Hematology and Oncology

## 2022-05-14 ENCOUNTER — Other Ambulatory Visit: Payer: Self-pay

## 2022-05-14 DIAGNOSIS — C090 Malignant neoplasm of tonsillar fossa: Secondary | ICD-10-CM

## 2022-05-15 ENCOUNTER — Other Ambulatory Visit: Payer: Self-pay

## 2022-05-16 ENCOUNTER — Telehealth: Payer: Self-pay | Admitting: Hematology and Oncology

## 2022-05-16 NOTE — Telephone Encounter (Signed)
Left patient a vm regarding all upcoming appointments

## 2022-05-17 NOTE — Progress Notes (Signed)
Has armband been applied?  Yes  Does patient have an allergy to IV contrast dye?: No   Has patient ever received premedication for IV contrast dye?: n/a   Does patient take metformin?: No  If patient does take metformin when was the last dose: n/a  Date of lab work: 05/08/2022 BUN: 26 CR: 1.48 eGfr: 55  Reduced Dose Contrast  IV site: Right Hand  Has IV site been added to flowsheet?  Yes  BP (!) 149/84 (BP Location: Left Arm)   Pulse 92   Temp (!) 97.2 F (36.2 C) (Temporal)   Resp 18   Ht 6' 1"$  (1.854 m)   Wt 272 lb (123.4 kg)   SpO2 98%   BMI 35.89 kg/m    Aaron Moore, BSN

## 2022-05-20 ENCOUNTER — Other Ambulatory Visit: Payer: Self-pay

## 2022-05-20 ENCOUNTER — Ambulatory Visit
Admission: RE | Admit: 2022-05-20 | Discharge: 2022-05-20 | Disposition: A | Discharge status: Home / Self Care | Payer: 59 | Source: Ambulatory Visit | Attending: Radiation Oncology | Admitting: Radiation Oncology

## 2022-05-20 VITALS — BP 149/84 | HR 92 | Temp 97.2°F | Resp 18 | Ht 73.0 in | Wt 272.0 lb

## 2022-05-20 DIAGNOSIS — C090 Malignant neoplasm of tonsillar fossa: Secondary | ICD-10-CM | POA: Insufficient documentation

## 2022-05-20 MED ORDER — SODIUM CHLORIDE 0.9% FLUSH
10.0000 mL | Freq: Once | INTRAVENOUS | Status: AC
  Administered 2022-05-20: 10 mL via INTRAVENOUS

## 2022-05-20 NOTE — Progress Notes (Signed)
Oncology Nurse Navigator Documentation   Met with Mr. Grumbine and his wife to provide PEG/port education prior to 05/29/22 placement. Provided port educational handout, showed example, provided guidance for post-surgical dsg removal, site care.  Using  PEG teaching device   and Teach Back, provided education for PEG use and care, including: hand hygiene, gravity bolus administration of daily water flushes and nutritional supplement, fluids and medications; care of tube insertion site including daily dressing change and cleaning; S&S of infection.   Mr. Gainous correctly verbalized procedures for gravity administration of water, dressing change and site care.  I provided written instructions for PEG flushing/dressing change in support of verbal instruction.   I provided/described contents of Start of Care Bolus Feeding Kit (2 Enfit 60 cc syringes, 1 box 4x4 drainage sponges, 1 package mesh briefs, 1 roll paper tape, 1 case Osmolite 1.5).  He voiced understanding he is to start using Osmolite per guidance of Nutrition. He understands I will be available for ongoing PEG support. Provided barium sulfate prep which I obtained from [facility] and reviewed instructions.    [name] RN, BSN, OCN Head & Neck Oncology Nurse [place] at [facility] Phone # [phone]  Fax # [phone]

## 2022-05-23 ENCOUNTER — Telehealth: Payer: Self-pay | Admitting: Hematology and Oncology

## 2022-05-23 NOTE — Telephone Encounter (Signed)
Rescheduled 02/26 appointment time to 02/27, patient has been called and voicemail was left.

## 2022-05-27 ENCOUNTER — Telehealth: Payer: Self-pay | Admitting: Licensed Clinical Social Worker

## 2022-05-27 ENCOUNTER — Ambulatory Visit: Payer: 59 | Admitting: Adult Health

## 2022-05-27 ENCOUNTER — Other Ambulatory Visit: Payer: 59

## 2022-05-27 ENCOUNTER — Telehealth: Payer: Self-pay | Admitting: Hematology and Oncology

## 2022-05-27 ENCOUNTER — Ambulatory Visit: Payer: 59

## 2022-05-27 ENCOUNTER — Other Ambulatory Visit: Payer: Self-pay

## 2022-05-27 DIAGNOSIS — C090 Malignant neoplasm of tonsillar fossa: Secondary | ICD-10-CM

## 2022-05-27 NOTE — Telephone Encounter (Signed)
Reached out to schedule Nutrition appt, no answer.

## 2022-05-27 NOTE — Telephone Encounter (Signed)
[place] Work  Clinical Social Work was referred by Art therapist for assessment of psychosocial needs.  Clinical Social Worker attempted to contact patient by phone  to offer support and assess for needs.   No answer. Left VM with direct contact information. Will also plan to meet patient during H&N [place] on 3/7.     [place], [place] Worker [employer]

## 2022-05-28 ENCOUNTER — Other Ambulatory Visit: Payer: Self-pay | Admitting: Radiology

## 2022-05-28 ENCOUNTER — Inpatient Hospital Stay: Payer: 59

## 2022-05-28 ENCOUNTER — Other Ambulatory Visit: Payer: Self-pay

## 2022-05-28 DIAGNOSIS — C090 Malignant neoplasm of tonsillar fossa: Secondary | ICD-10-CM

## 2022-05-28 NOTE — Consult Note (Signed)
Chief Complaint: Patient was seen in consultation today for Port-A-Cath and percutaneous gastrostomy tube placements    Referring Physician(s): Iruku,Praveena  Supervising Physician: Aletta Edouard  Patient Status: [facility] - [facility]  History of Present Illness: Aaron Moore is a 57 y.o. male with past medical history of prostate cancer, ED, gout, nephrolithiasis, hypertension, obesity who presents now with newly diagnosed squamous cell carcinoma of the tonsil.  He is scheduled today for Port-A-Cath and percutaneous gastrostomy tube placements prior to chemoradiation.  Past Medical History:  Diagnosis Date   Abdominal pain    Cancer ([place])    prostate cancer   Complication of anesthesia    slow to wake up 3 years ago after foot surgery   ED (erectile dysfunction)    Gout    Gout    History of kidney stones    Hypertension    Obesity, Class II, BMI 35-39.9, with comorbidity    Painful orthopaedic hardware [facility])    left foot    Past Surgical History:  Procedure Laterality Date   amputation of 2 toes on left foot      CARPAL TUNNEL RELEASE  05/03/2011   Procedure: CARPAL TUNNEL RELEASE;  Surgeon: Wynonia Sours, MD;  Location: [place];  Service: Orthopedics;  Laterality: Left;   FOOT FRACTURE SURGERY Left    x3   HARDWARE REMOVAL Left 06/03/2019   Procedure: HARDWARE REMOVAL;  Surgeon: Erle Crocker, MD;  Location: [place];  Service: Orthopedics;  Laterality: Left;  PROCEDURE: LEFT FOOT REMOVAL OF HARDWARE, IRRIGATION AND DEBRIDEMENT,PLACEMENT OF CEMENT SPACER, DEBULKIN, SOFT TISSUE MASS LENGTH OF SURGERY: 2 HOURS   HERNIA REPAIR  02/06/2009   Lap supraumb & umb VWH repairs   INCISION AND DRAINAGE OF WOUND Left 06/03/2019   Procedure: IRRIGATION AND DEBRIDEMENT WOUND WITH IMPLANTATION OF ANTIBIOTIC CEMENT SPACER;  Surgeon: Erle Crocker, MD;  Location: [place];  Service: Orthopedics;  Laterality: Left;    left rotator cuff surgery      LYMPHADENECTOMY Bilateral 02/07/2022   Procedure: BILATERAL PELVIC LYMPHADENECTOMY;  Surgeon: Raynelle Bring, MD;  [facility];  Service: Urology;  Laterality: Bilateral;   MASS EXCISION Left 06/03/2019   Procedure: DEBULKING LEFT FOOT MASS;  Surgeon: Erle Crocker, MD;  Location: [place];  Service: Orthopedics;  Laterality: Left;   ROBOT ASSISTED LAPAROSCOPIC RADICAL PROSTATECTOMY N/A 02/07/2022   Procedure: XI ROBOTIC ASSISTED LAPAROSCOPIC RADICAL PROSTATECTOMY LEVEL 3;  Surgeon: Raynelle Bring, MD;  [facility];  Service: Urology;  Laterality: N/A;  210 MINUTES NEEDED FOR CASE    Allergies: Patient has no known allergies.  Medications: Prior to Admission medications   Medication Sig Start Date End Date Taking? Authorizing Provider  amLODipine-valsartan (EXFORGE) 10-320 MG tablet Take 1 tablet by mouth daily. 08/06/21   [provider]  dexamethasone (DECADRON) 4 MG tablet Take 2 tablets daily x 3 days starting the day after cisplatin chemotherapy. Take with food. 05/13/22   Benay Pike, MD  docusate sodium (COLACE) 100 MG capsule Take 1 capsule (100 mg total) by mouth 2 (two) times daily. 02/07/22   Debbrah Alar, PA-C  lidocaine-prilocaine (EMLA) cream Apply to affected area once 05/13/22   Benay Pike, MD  ondansetron (ZOFRAN) 8 MG tablet Take 1 tablet (8 mg total) by mouth every 8 (eight) hours as needed for nausea or vomiting. Start on the third day after cisplatin. 05/13/22   Benay Pike, MD  prochlorperazine (COMPAZINE) 10 MG tablet  Take 1 tablet (10 mg total) by mouth every 6 (six) hours as needed (Nausea or vomiting). 05/13/22   Benay Pike, MD  Semaglutide-Weight Management (WEGOVY) 1.7 MG/0.75ML SOAJ Inject 1.7 mg into the skin once a week. 03/28/22     Semaglutide-Weight Management (WEGOVY) 2.4 MG/0.75ML SOAJ 0.75 mL Subcutaneous weekly 30 day(s) 11/13/21     sulfamethoxazole-trimethoprim (BACTRIM  DS) 800-160 MG tablet Take 1 tablet by mouth 2 (two) times daily. Start the day prior to foley removal appointment 02/07/22   Debbrah Alar, PA-C  traMADol (ULTRAM) 50 MG tablet Take 1-2 tablets (50-100 mg total) by mouth every 6 (six) hours as needed for moderate pain or severe pain. 02/07/22   Debbrah Alar, PA-C     Family History  Problem Relation Age of Onset   Diabetes Mother    Diabetes Father     Social History   Socioeconomic History   Marital status: Married    Spouse name: Not on file   Number of children: Not on file   Years of education: Not on file   Highest education level: Not on file  Occupational History   Not on file  Tobacco Use   Smoking status: Never   Smokeless tobacco: Never  Vaping Use   Vaping Use: Never used  Substance and Sexual Activity   Alcohol use: No   Drug use: No   Sexual activity: Not on file  Other Topics Concern   Not on file  Social History Narrative   Not on file   Social Determinants of Health   Financial Resource Strain: Not on file  Food Insecurity: No Food Insecurity (02/07/2022)   Hunger Vital Sign    Worried About Running Out of Food in the Last Year: Never true    Ran Out of Food in the Last Year: Never true  Transportation Needs: No Transportation Needs (02/07/2022)   PRAPARE - Hydrologist (Medical): No    Lack of Transportation (Non-Medical): No  Physical Activity: Not on file  Stress: Not on file  Social Connections: Not on file      Review of Systems  Vital Signs:      Code Status:     Physical Exam  Imaging: No results found.  Labs:  CBC: Recent Labs    02/04/22 0832 02/07/22 1602 02/08/22 0434  WBC 9.9  --   --   HGB 15.2 13.1 13.6  HCT 46.9 40.2 41.0  PLT 239  --   --     COAGS: No results for input(s): "INR", "APTT" in the last 8760 hours.  BMP: Recent Labs    02/04/22 0832 05/08/22 1452  NA 140 139  K 3.5 4.2  CL 104 106  CO2 28 24  GLUCOSE 90  119*  BUN 15 26*  CALCIUM 9.0 7.9*  CREATININE 1.08 1.48*  GFRNONAA >60 55*    LIVER FUNCTION TESTS: Recent Labs    05/08/22 1452  BILITOT 0.6  AST 22  ALT 71*  ALKPHOS 103  PROT 6.2*  ALBUMIN 2.3*    TUMOR MARKERS: No results for input(s): "AFPTM", "CEA", "CA199", "CHROMGRNA" in the last 8760 hours.  Assessment and Plan: 57 y.o. male with past medical history of prostate cancer, ED, gout, nephrolithiasis, hypertension, obesity who presents now with newly diagnosed squamous cell carcinoma of the tonsil.  He is scheduled today for Port-A-Cath and percutaneous gastrostomy tube placements prior to chemoradiation.  Details/risks of procedures, including but not limited to, internal  bleeding, infection, injury to adjacent structures, pneumothorax discussed with patient with his understanding and consent.   Thank you for this interesting consult.  I greatly enjoyed meeting Sedric Amundsen and look forward to participating in their care.  A copy of this report was sent to the requesting provider on this date.  Electronically Signed: D. Rowe Robert, PA-C 05/28/2022, 2:25 PM   I spent a total of  25 minutes   in face to face in clinical consultation, greater than 50% of which was counseling/coordinating care for port a cath and percutaneous gastrostomy tube placements

## 2022-05-29 ENCOUNTER — Ambulatory Visit (HOSPITAL_COMMUNITY)
Admission: RE | Admit: 2022-05-29 | Discharge: 2022-05-29 | Disposition: A | Discharge status: Home / Self Care | Payer: 59 | Source: Ambulatory Visit | Attending: Hematology and Oncology | Admitting: Hematology and Oncology

## 2022-05-29 ENCOUNTER — Other Ambulatory Visit: Payer: Self-pay

## 2022-05-29 ENCOUNTER — Encounter (HOSPITAL_COMMUNITY): Payer: Self-pay

## 2022-05-29 DIAGNOSIS — Z66 Do not resuscitate: Secondary | ICD-10-CM | POA: Diagnosis present

## 2022-05-29 DIAGNOSIS — C090 Malignant neoplasm of tonsillar fossa: Secondary | ICD-10-CM | POA: Diagnosis present

## 2022-05-29 HISTORY — PX: IR GASTROSTOMY TUBE MOD SED: IMG625

## 2022-05-29 HISTORY — PX: IR IMAGING GUIDED PORT INSERTION: IMG5740

## 2022-05-29 LAB — CBC WITH DIFFERENTIAL/PLATELET
Abs Immature Granulocytes: 0.04 10*3/uL (ref 0.00–0.07)
Basophils Absolute: 0 10*3/uL (ref 0.0–0.1)
Basophils Relative: 0 %
Eosinophils Absolute: 0.2 10*3/uL (ref 0.0–0.5)
Eosinophils Relative: 2 %
HCT: 40.1 % (ref 39.0–52.0)
Hemoglobin: 13 g/dL (ref 13.0–17.0)
Immature Granulocytes: 0 %
Lymphocytes Relative: 11 %
Lymphs Abs: 1.1 10*3/uL (ref 0.7–4.0)
MCH: 27.9 pg (ref 26.0–34.0)
MCHC: 32.4 g/dL (ref 30.0–36.0)
MCV: 86.1 fL (ref 80.0–100.0)
Monocytes Absolute: 0.7 10*3/uL (ref 0.1–1.0)
Monocytes Relative: 7 %
Neutro Abs: 7.8 10*3/uL — ABNORMAL HIGH (ref 1.7–7.7)
Neutrophils Relative %: 80 %
Platelets: 251 10*3/uL (ref 150–400)
RBC: 4.66 MIL/uL (ref 4.22–5.81)
RDW: 14.6 % (ref 11.5–15.5)
WBC: 9.8 10*3/uL (ref 4.0–10.5)
nRBC: 0 % (ref 0.0–0.2)

## 2022-05-29 LAB — BASIC METABOLIC PANEL
Anion gap: 8 (ref 5–15)
BUN: 18 mg/dL (ref 6–20)
CO2: 27 mmol/L (ref 22–32)
Calcium: 9.1 mg/dL (ref 8.9–10.3)
Chloride: 104 mmol/L (ref 98–111)
Creatinine, Ser: 1.23 mg/dL (ref 0.61–1.24)
GFR, Estimated: >60 mL/min (ref >60)
Glucose, Bld: 103 mg/dL — ABNORMAL HIGH (ref 70–99)
Potassium: 3.9 mmol/L (ref 3.5–5.1)
Sodium: 139 mmol/L (ref 135–145)

## 2022-05-29 LAB — PROTIME-INR
INR: 1.1 (ref 0.8–1.2)
Prothrombin Time: 13.8 seconds (ref 11.4–15.2)

## 2022-05-29 MED ORDER — LIDOCAINE HCL 1 % IJ SOLN
INTRAMUSCULAR | Status: AC
  Administered 2022-05-29: 10 mL via INTRADERMAL
  Administered 2022-05-29: 20 mL via INTRADERMAL
  Filled 2022-05-29: qty 40

## 2022-05-29 MED ORDER — IOHEXOL 300 MG/ML  SOLN
50.0000 mL | Freq: Once | INTRAMUSCULAR | Status: AC | PRN
  Administered 2022-05-29: 15 mL

## 2022-05-29 MED ORDER — HYDROMORPHONE HCL 1 MG/ML IJ SOLN: 1.0000 mg | INTRAMUSCULAR | Status: DC | PRN

## 2022-05-29 MED ORDER — AMLODIPINE BESYLATE 10 MG PO TABS
10.0000 mg | ORAL_TABLET | Freq: Once | ORAL | Status: AC
  Administered 2022-05-29: 10 mg via ORAL
  Filled 2022-05-29: qty 1

## 2022-05-29 MED ORDER — MIDAZOLAM HCL 2 MG/2ML IJ SOLN
INTRAMUSCULAR | Status: AC | PRN
  Administered 2022-05-29 (×4): 1 mg via INTRAVENOUS

## 2022-05-29 MED ORDER — FENTANYL CITRATE (PF) 100 MCG/2ML IJ SOLN
INTRAMUSCULAR | Status: AC
  Filled 2022-05-29: qty 4

## 2022-05-29 MED ORDER — LIDOCAINE VISCOUS HCL 2 % MT SOLN
OROMUCOSAL | Status: AC
  Administered 2022-05-29: 5 mL via OROMUCOSAL
  Filled 2022-05-29: qty 15

## 2022-05-29 MED ORDER — ONDANSETRON HCL 4 MG/2ML IJ SOLN
4.0000 mg | INTRAMUSCULAR | Status: DC | PRN
  Filled 2022-05-29: qty 2

## 2022-05-29 MED ORDER — HEPARIN SOD (PORK) LOCK FLUSH 100 UNIT/ML IV SOLN
INTRAVENOUS | Status: AC
  Filled 2022-05-29: qty 5

## 2022-05-29 MED ORDER — FENTANYL CITRATE (PF) 100 MCG/2ML IJ SOLN
INTRAMUSCULAR | Status: AC | PRN
  Administered 2022-05-29 (×4): 50 ug via INTRAVENOUS

## 2022-05-29 MED ORDER — MIDAZOLAM HCL 2 MG/2ML IJ SOLN
INTRAMUSCULAR | Status: AC
  Filled 2022-05-29: qty 6

## 2022-05-29 MED ORDER — HYDROCODONE-ACETAMINOPHEN 5-325 MG PO TABS: 1.0000 | ORAL_TABLET | ORAL | Status: DC | PRN

## 2022-05-29 MED ORDER — CEFAZOLIN IN SODIUM CHLORIDE 3-0.9 GM/100ML-% IV SOLN
3.0000 g | INTRAVENOUS | Status: AC
  Administered 2022-05-29: 3 g via INTRAVENOUS
  Filled 2022-05-29: qty 100

## 2022-05-29 MED ORDER — HEPARIN SOD (PORK) LOCK FLUSH 100 UNIT/ML IV SOLN
INTRAVENOUS | Status: AC | PRN
  Administered 2022-05-29: 500 [IU] via INTRAVENOUS

## 2022-05-29 MED ORDER — GLUCAGON HCL RDNA (DIAGNOSTIC) 1 MG IJ SOLR
INTRAMUSCULAR | Status: AC
  Filled 2022-05-29: qty 1

## 2022-05-29 MED ORDER — SODIUM CHLORIDE 0.9 % IV SOLN: INTRAVENOUS | Status: DC

## 2022-05-29 MED ORDER — DIPHENHYDRAMINE HCL 50 MG/ML IJ SOLN
INTRAMUSCULAR | Status: AC
  Filled 2022-05-29: qty 1

## 2022-05-29 MED ORDER — IRBESARTAN 300 MG PO TABS
300.0000 mg | ORAL_TABLET | Freq: Once | ORAL | Status: AC
  Administered 2022-05-29: 300 mg via ORAL
  Filled 2022-05-29: qty 1

## 2022-05-29 MED ORDER — ONDANSETRON HCL 4 MG/2ML IJ SOLN
4.0000 mg | Freq: Once | INTRAMUSCULAR | Status: AC
  Administered 2022-05-29: 4 mg via INTRAVENOUS

## 2022-05-29 NOTE — Progress Notes (Signed)
Pharmacist Chemotherapy Monitoring - Initial Assessment    Anticipated start date: 06/05/22   The following has been reviewed per standard work regarding the patient's treatment regimen: The patient's diagnosis, treatment plan and drug doses, and organ/hematologic function Lab orders and baseline tests specific to treatment regimen  The treatment plan start date, drug sequencing, and pre-medications Prior authorization status  Patient's documented medication list, including drug-drug interaction screen and prescriptions for anti-emetics and supportive care specific to the treatment regimen The drug concentrations, fluid compatibility, administration routes, and timing of the medications to be used The patient's access for treatment and lifetime cumulative dose history, if applicable  The patient's medication allergies and previous infusion related reactions, if applicable   Changes made to treatment plan:  N/A  Follow up needed:  N/A   Philomena Course, [place], 05/29/2022  2:02 PM

## 2022-05-29 NOTE — Procedures (Signed)
Interventional Radiology Procedure Note  Procedure: Single Lumen Power Port Placement    Access:  Right IJ vein.  Findings: Catheter tip positioned at SVC/RA junction. Port is ready for immediate use.   Complications: None  EBL: < 10 mL  Recommendations:  - Ok to shower in 24 hours - Do not submerge for 7 days - Routine line care   Lamika Connolly T. Crystelle Ferrufino, M.D Pager:  [phone]   

## 2022-05-29 NOTE — Discharge Instructions (Signed)
Discharge Instructions:   Please call Interventional Radiology clinic [phone] with any questions or concerns.  You may remove your dressing and shower tomorrow.  Do not use EMLA / Lidocaine cream for 2 weeks post Port Insertion for this will remove the surgical glue.    Moderate Conscious Sedation, Adult, Care After This sheet gives you information about how to care for yourself after your procedure. Your health care provider may also give you more specific instructions. If you have problems or questions, contact your health care provider. What can I expect after the procedure? After the procedure, it is common to have: Sleepiness for several hours. Impaired judgment for several hours. Difficulty with balance. Vomiting if you eat too soon. Follow these instructions at home: For the time period you were told by your health care provider: Rest. Do not participate in activities where you could fall or become injured. Do not drive or use machinery. Do not drink alcohol. Do not take sleeping pills or medicines that cause drowsiness. Do not make important decisions or sign legal documents. Do not take care of children on your own. Eating and drinking  Follow the diet recommended by your health care provider. Drink enough fluid to keep your urine pale yellow. If you vomit: Drink water, juice, or soup when you can drink without vomiting. Make sure you have little or no nausea before eating solid foods. General instructions Take over-the-counter and prescription medicines only as told by your health care provider. Have a responsible adult stay with you for the time you are told. It is important to have someone help care for you until you are awake and alert. Do not smoke. Keep all follow-up visits as told by your health care provider. This is important. Contact a health care provider if: You are still sleepy or having trouble with balance after 24 hours. You feel  light-headed. You keep feeling nauseous or you keep vomiting. You develop a rash. You have a fever. You have redness or swelling around the IV site. Get help right away if: You have trouble breathing. You have new-onset confusion at home. Summary After the procedure, it is common to feel sleepy, have impaired judgment, or feel nauseous if you eat too soon. Rest after you get home. Know the things you should not do after the procedure. Follow the diet recommended by your health care provider and drink enough fluid to keep your urine pale yellow. Get help right away if you have trouble breathing or new-onset confusion at home. This information is not intended to replace advice given to you by your health care provider. Make sure you discuss any questions you have with your health care provider. Document Revised: 07/16/2019 Document Reviewed: 02/11/2019 Elsevier Patient Education  [place] Insertion, Care After The following information offers guidance on how to care for yourself after your procedure. Your health care provider may also give you more specific instructions. If you have problems or questions, contact your health care provider. What can I expect after the procedure? After the procedure, it is common to have: Discomfort at the port insertion site. Bruising on the skin over the port. This should improve over 3-4 days. Follow these instructions at home: [facility] care After your port is placed, you will get a manufacturer's information card. The card has information about your port. Keep this card with you at all times. Take care of the port as told by your health care provider. Ask your health care provider  if you or a family member can get training for taking care of the port at home. A home health care nurse will be be available to help care for the port. Make sure to remember what type of port you have. Incision care     Follow instructions from your  health care provider about how to take care of your port insertion site. Make sure you: Wash your hands with soap and water for at least 20 seconds before and after you change your bandage (dressing). If soap and water are not available, use hand sanitizer. Change your dressing as told by your health care provider. Leave stitches (sutures), skin glue, or adhesive strips in place. These skin closures may need to stay in place for 2 weeks or longer. If adhesive strip edges start to loosen and curl up, you may trim the loose edges. Do not remove adhesive strips completely unless your health care provider tells you to do that. Check your port insertion site every day for signs of infection. Check for: Redness, swelling, or pain. Fluid or blood. Warmth. Pus or a bad smell. Activity Return to your normal activities as told by your health care provider. Ask your health care provider what activities are safe for you. You may have to avoid lifting. Ask your health care provider how much you can safely lift. General instructions Take over-the-counter and prescription medicines only as told by your health care provider. Do not take baths, swim, or use a hot tub until your health care provider approves. Ask your health care provider if you may take showers. You may only be allowed to take sponge baths. If you were given a sedative during the procedure, it can affect you for several hours. Do not drive or operate machinery until your health care provider says that it is safe. Wear a medical alert bracelet in case of an emergency. This will tell any health care providers that you have a port. Keep all follow-up visits. This is important. Contact a health care provider if: You cannot flush your port with saline as directed, or you cannot draw blood from the port. You have a fever or chills. You have redness, swelling, or pain around your port insertion site. You have fluid or blood coming from your port  insertion site. Your port insertion site feels warm to the touch. You have pus or a bad smell coming from the port insertion site. Get help right away if: You have chest pain or shortness of breath. You have bleeding from your port that you cannot control. These symptoms may be an emergency. Get help right away. Call 911. Do not wait to see if the symptoms will go away. Do not drive yourself to the hospital. Summary Take care of the port as told by your health care provider. Keep the manufacturer's information card with you at all times. Change your dressing as told by your health care provider. Contact a health care provider if you have a fever or chills or if you have redness, swelling, or pain around your port insertion site. Keep all follow-up visits. This information is not intended to replace advice given to you by your health care provider. Make sure you discuss any questions you have with your health care provider. Document Revised: 09/19/2020 Document Reviewed: 09/19/2020 Elsevier Patient Education  [place].    Gastrostomy Tube Home Guide, Adult A gastrostomy tube, or G-tube, is a tube that is inserted through the abdomen into the stomach. The  tube is used to give feedings and medicines when a person cannot eat and drink enough on his or her own or take medicines by mouth. How to care for the insertion site Washing hands with soap and water.   Supplies needed: Saline solution or clean, warm water and soap. Saline solution is made of salt and water. Cotton swab or gauze. Pre-cut gauze bandage (dressing) and tape, if needed. Instructions Follow these steps daily to clean the insertion site: Wash your hands with soap and water for at least 20 seconds. Remove the dressing (if there is one) that is between the person's skin and the tube. Check the area where the tube enters the skin. Check daily for problems such as: Redness, rash, or irritation. Swelling. Pus-like  drainage. Extra skin growth. Moisten the cotton swab or gauze with the saline solution or with a soap-and-water mixture. Gently clean around the insertion site. Remove any drainage or crusted material. When the G-tube is first put in, a normal saline solution or water can be used to clean the skin. After the skin around the tube has healed, mild soap and water may be used. Apply a dressing (if there should be one) between the person's skin and the tube. How to flush a G-tube Flush the G-tube regularly to keep it from clogging. Flush it before and after feedings and as often as told by the health care provider. Supplies needed: Purified or germ-free (sterile) water, warmed. Container with lid for boiling water, if needed. 60 cc G-tube syringe. Instructions Before you begin, decide whether to use sterile water or purified drinking water. Use only sterile water if: The person has a weak disease-fighting (immune) system. The person has trouble fighting off infections (is immunocompromised). You are unsure about the amount of chemical contaminants in purified or drinking water. Use purified drinking water in all other cases. To purify drinking water by boiling: Boil water for at least 1 minute. Keep lid over water while it boils. Let water cool to room temperature before using. Follow these steps to flush the G-tube: Wash your hands with soap and water for at least 20 seconds. Bring out (draw up) 30 mL of warm water in a syringe. Connect the syringe to the tube. Slowly and gently push the water into the tube. General tips If the tube comes out: Cover the opening with a clean dressing and tape. Get help right away. If there is skin or scar tissue growing where the tube enters the skin: Keep the area clean and dry. Secure the tube with tape so that the tube does not move around too much. If the tube gets clogged: Slowly push warm water into the tube with a large syringe. Do not force the  fluid into the tube or push an object into the tube. Get help right away if you cannot unclog the tube. Follow these instructions at home: Feedings Give feedings at room temperature. If feedings are continuous: Do not put more than 4 hours' worth of feedings in the feeding bag. Stop the feedings when you need to give medicine or flush the tube. Be sure to restart the feedings. Make sure the person's head is above his or her stomach (upright position). This will prevent choking and discomfort. Make sure the person is in the right position during and after feedings. During feedings, have the person in the upright position. After a non-continuous feeding (bolus feeding), have the person stay in the upright position for 1 hour. Cover and place  unused feedings in the refrigerator. Replace feeding bags and syringes as told. Good hygiene Make sure the person takes good care of his or her mouth and teeth (oral hygiene), such as by brushing his or her teeth. Keep the area where the tube enters the skin clean and dry. General instructions Do not pull or put tension on the tube. Before you remove the tube cap or disconnect a syringe, close the tube by using a clamp (clamping) or bending (kinking) the tube. Measure the length of the G-tube every day from the insertion site to the end of the tube. If the person's G-tube has a balloon, check the fluid in the balloon every week. Check the manufacturer's specifications to find the amount of fluid that should be in the balloon. Remove excess air from the G-tube as told. This is called venting. Do not push feedings, medicines, or flushes fast. Use the feeding tube equipment, such as syringes and connectors, only as told by your health care provider. Contact a health care provider if: The person with the tube has constipation or a fever. A large amount of fluid or mucus-like liquid is leaking from the tube. Skin or scar tissue appears to be growing where  the tube enters the skin. The length of tube from the insertion site to the G-tube gets longer. Get help right away if: The person with the tube has any of these problems: Severe pain, tenderness, or bloating in the abdomen. Nausea or vomiting. Trouble breathing or shortness of breath. Any of these problems happen in the area where the tube enters the skin: Redness, irritation, swelling, or soreness. Pus-like discharge. A bad smell. The tube is clogged and cannot be flushed. The tube comes out. The tube will need to be put back in within 4 hours. Summary A gastrostomy tube, or G-tube, is a tube that is inserted through the abdomen into the stomach. The tube is used to give feedings and medicines when a person cannot eat and drink enough on his or her own or cannot take medicine by mouth. Check and clean the insertion site daily as told by the person's health care provider. Flush the G-tube regularly to keep it from clogging. Flush it before and after feedings and as often as told. Keep the area where the tube enters the skin clean and dry. This information is not intended to replace advice given to you by your health care provider. Make sure you discuss any questions you have with your health care provider. Document Revised: 03/08/2020 Document Reviewed: 08/05/2019 Elsevier Patient Education  [place].

## 2022-05-29 NOTE — Procedures (Signed)
Interventional Radiology Procedure Note  Procedure: Percutaneous gastrostomy  Complications: None  Estimated Blood Loss: < 10 mL  Findings: 20 Fr bumper retention gastrostomy tube placed with tip in body of stomach. OK to use as early as 24 hours from placement.  Venetia Night. Kathlene Cote, M.D Pager:  [phone]

## 2022-05-29 NOTE — Progress Notes (Signed)
[place] made aware of elevated blood pressure. Ordered home blood pressure medication Amlodipine/Valsartan 10-320 mg po.

## 2022-05-31 DIAGNOSIS — Z510 Encounter for antineoplastic radiation therapy: Secondary | ICD-10-CM | POA: Diagnosis present

## 2022-05-31 DIAGNOSIS — Z79899 Other long term (current) drug therapy: Secondary | ICD-10-CM | POA: Diagnosis not present

## 2022-05-31 DIAGNOSIS — Z5111 Encounter for antineoplastic chemotherapy: Secondary | ICD-10-CM | POA: Diagnosis present

## 2022-05-31 DIAGNOSIS — Z8546 Personal history of malignant neoplasm of prostate: Secondary | ICD-10-CM | POA: Diagnosis not present

## 2022-05-31 DIAGNOSIS — C090 Malignant neoplasm of tonsillar fossa: Secondary | ICD-10-CM | POA: Insufficient documentation

## 2022-05-31 NOTE — Progress Notes (Signed)
Oncology Nurse Navigator Documentation   Mr. Matteson had his PEG and PAC placed 2/28. He came in today to get further education regarding flushing and changing the dressing to the PEG. I demonstrated the dressing change and flush and both he and his wife voiced understanding. The PEG site is sore and slightly red due to pressure from the retention ring. I cleaned the area with a moistened 2x2 and Q-tip. There was dried blood present which was removed. I also removed the dressing from his PAC site which was intact with dermabond with no visible redness. They know to call me if they have any question and know that I will also see him on Monday after his first radiation.   [name] RN, BSN, OCN Head & Neck Oncology Nurse [place] [facility] Phone # [phone]  Fax # [phone]

## 2022-06-03 ENCOUNTER — Other Ambulatory Visit: Payer: Self-pay | Admitting: Radiation Oncology

## 2022-06-03 ENCOUNTER — Ambulatory Visit
Admission: RE | Admit: 2022-06-03 | Discharge: 2022-06-03 | Disposition: A | Discharge status: Home / Self Care | Payer: 59 | Source: Ambulatory Visit | Attending: Radiation Oncology | Admitting: Radiation Oncology

## 2022-06-03 ENCOUNTER — Other Ambulatory Visit: Payer: Self-pay

## 2022-06-03 DIAGNOSIS — Z510 Encounter for antineoplastic radiation therapy: Secondary | ICD-10-CM | POA: Diagnosis not present

## 2022-06-03 DIAGNOSIS — C090 Malignant neoplasm of tonsillar fossa: Secondary | ICD-10-CM

## 2022-06-03 LAB — RAD ONC ARIA SESSION SUMMARY
Course Elapsed Days: 0
Plan Fractions Treated to Date: 1
Plan Prescribed Dose Per Fraction: 2 Gy
Plan Total Fractions Prescribed: 35
Plan Total Prescribed Dose: 70 Gy
Reference Point Dosage Given to Date: 2 Gy
Reference Point Session Dosage Given: 2 Gy
Session Number: 1

## 2022-06-03 MED ORDER — LIDOCAINE VISCOUS HCL 2 % MT SOLN: OROMUCOSAL | 3 refills | Status: DC

## 2022-06-03 MED ORDER — SONAFINE EX EMUL
1.0000 | Freq: Once | CUTANEOUS | Status: AC
  Administered 2022-06-03: 1 via TOPICAL

## 2022-06-03 NOTE — Progress Notes (Signed)
Oncology Nurse Navigator Documentation   To provide support, encouragement and care continuity, met with [name] after his initial RT.  He was accompanied by his wife. I reviewed the 2-step treatment process, answered questions.  [name] completed treatment without difficulty, denied questions/concerns. I reviewed the registration/arrival procedure for subsequent treatments. I encouraged them to call me with questions/concerns as treatments proceed.   [name] RN, BSN, OCN Head & Neck Oncology Nurse [place] [facility] Phone # [phone]  Fax # [phone]

## 2022-06-04 ENCOUNTER — Inpatient Hospital Stay: Payer: 59 | Attending: Hematology and Oncology

## 2022-06-04 ENCOUNTER — Inpatient Hospital Stay (HOSPITAL_BASED_OUTPATIENT_CLINIC_OR_DEPARTMENT_OTHER): Payer: 59 | Admitting: Hematology and Oncology

## 2022-06-04 ENCOUNTER — Other Ambulatory Visit: Payer: Self-pay

## 2022-06-04 ENCOUNTER — Ambulatory Visit
Admission: RE | Admit: 2022-06-04 | Discharge: 2022-06-04 | Disposition: A | Discharge status: Home / Self Care | Payer: 59 | Source: Ambulatory Visit | Attending: Radiation Oncology | Admitting: Radiation Oncology

## 2022-06-04 VITALS — BP 153/94 | HR 79 | Temp 98.1°F | Resp 16 | Ht 73.0 in | Wt 271.0 lb

## 2022-06-04 DIAGNOSIS — Z8546 Personal history of malignant neoplasm of prostate: Secondary | ICD-10-CM | POA: Insufficient documentation

## 2022-06-04 DIAGNOSIS — C090 Malignant neoplasm of tonsillar fossa: Secondary | ICD-10-CM | POA: Diagnosis not present

## 2022-06-04 DIAGNOSIS — C099 Malignant neoplasm of tonsil, unspecified: Secondary | ICD-10-CM | POA: Insufficient documentation

## 2022-06-04 DIAGNOSIS — Z510 Encounter for antineoplastic radiation therapy: Secondary | ICD-10-CM | POA: Insufficient documentation

## 2022-06-04 DIAGNOSIS — Z79899 Other long term (current) drug therapy: Secondary | ICD-10-CM | POA: Insufficient documentation

## 2022-06-04 DIAGNOSIS — Z5111 Encounter for antineoplastic chemotherapy: Secondary | ICD-10-CM | POA: Insufficient documentation

## 2022-06-04 DIAGNOSIS — Z95828 Presence of other vascular implants and grafts: Secondary | ICD-10-CM | POA: Insufficient documentation

## 2022-06-04 LAB — BASIC METABOLIC PANEL - CANCER CENTER ONLY
Anion gap: 5 (ref 5–15)
BUN: 15 mg/dL (ref 6–20)
CO2: 30 mmol/L (ref 22–32)
Calcium: 9 mg/dL (ref 8.9–10.3)
Chloride: 105 mmol/L (ref 98–111)
Creatinine: 1.13 mg/dL (ref 0.61–1.24)
GFR, Estimated: >60 mL/min (ref >60)
Glucose, Bld: 90 mg/dL (ref 70–99)
Potassium: 3.9 mmol/L (ref 3.5–5.1)
Sodium: 140 mmol/L (ref 135–145)

## 2022-06-04 LAB — CBC WITH DIFFERENTIAL (CANCER CENTER ONLY)
Abs Immature Granulocytes: 0.03 10*3/uL (ref 0.00–0.07)
Basophils Absolute: 0 10*3/uL (ref 0.0–0.1)
Basophils Relative: 0 %
Eosinophils Absolute: 0.1 10*3/uL (ref 0.0–0.5)
Eosinophils Relative: 2 %
HCT: 37.2 % — ABNORMAL LOW (ref 39.0–52.0)
Hemoglobin: 12.6 g/dL — ABNORMAL LOW (ref 13.0–17.0)
Immature Granulocytes: 0 %
Lymphocytes Relative: 12 %
Lymphs Abs: 0.9 10*3/uL (ref 0.7–4.0)
MCH: 28.2 pg (ref 26.0–34.0)
MCHC: 33.9 g/dL (ref 30.0–36.0)
MCV: 83.2 fL (ref 80.0–100.0)
Monocytes Absolute: 0.6 10*3/uL (ref 0.1–1.0)
Monocytes Relative: 8 %
Neutro Abs: 6.3 10*3/uL (ref 1.7–7.7)
Neutrophils Relative %: 78 %
Platelet Count: 219 10*3/uL (ref 150–400)
RBC: 4.47 MIL/uL (ref 4.22–5.81)
RDW: 14.6 % (ref 11.5–15.5)
WBC Count: 8.1 10*3/uL (ref 4.0–10.5)
nRBC: 0 % (ref 0.0–0.2)

## 2022-06-04 LAB — RAD ONC ARIA SESSION SUMMARY
Course Elapsed Days: 1
Plan Fractions Treated to Date: 2
Plan Prescribed Dose Per Fraction: 2 Gy
Plan Total Fractions Prescribed: 35
Plan Total Prescribed Dose: 70 Gy
Reference Point Dosage Given to Date: 4 Gy
Reference Point Session Dosage Given: 2 Gy
Session Number: 2

## 2022-06-04 LAB — MAGNESIUM: Magnesium: 1.8 mg/dL (ref 1.7–2.4)

## 2022-06-04 MED ORDER — HEPARIN SOD (PORK) LOCK FLUSH 100 UNIT/ML IV SOLN
500.0000 [IU] | Freq: Once | INTRAVENOUS | Status: AC
  Administered 2022-06-04: 500 [IU]

## 2022-06-04 MED ORDER — SODIUM CHLORIDE 0.9% FLUSH
10.0000 mL | Freq: Once | INTRAVENOUS | Status: AC
  Administered 2022-06-04: 10 mL

## 2022-06-04 MED FILL — Dexamethasone Sodium Phosphate Inj 100 MG/10ML: INTRAMUSCULAR | Qty: 1 | Status: AC

## 2022-06-04 MED FILL — Fosaprepitant Dimeglumine For IV Infusion 150 MG (Base Eq): INTRAVENOUS | Qty: 5 | Status: AC

## 2022-06-04 NOTE — Progress Notes (Signed)
[facility] NOTE  Patient Care Team: Janie Morning, DO as PCP - General (Family Medicine) Stanford Breed, Keenan Bachelor, MD as Referring Physician (Otolaryngology) Eppie Gibson, MD as Consulting Physician (Radiation Oncology) Benay Pike, MD as Consulting Physician (Hematology and Oncology) Malmfelt, Stephani Police, RN as Oncology Nurse Navigator  CHIEF COMPLAINTS/PURPOSE OF CONSULTATION:  SCC tonsil.  ASSESSMENT & PLAN:   This is a very pleasant 57 yr old male patient with HTN, Prostate cancer referred to ENT oncology for new diagnosis of SCC tonsil, P16. He first noticed some sore throat, difficulty swallowing and some slurred speech and was investigated for the same. PE demonstrated right tonsillar mass on exam, some slurred speech, no definitive palpable LN. We reviewed his imaging in our TB and the recommendation was to consider CRT since the size of the tonsillar mass is estimated at just over 4 cms. We discussed about no further biopsy because clinically and radiologically the presentation is consistent with invasive SCC and not just in situ SCC.  He is now on concurrent chemoRT, anticipate first cycle of chemo tomorrow. If GFR today is normal, then can change the cisplatin to 40 mg/m2. No new symptoms except ongoing difficulty swallowing, has G tube in place. Encouraged hydration.  Benay Pike MD    HISTORY OF PRESENTING ILLNESS:  Aaron Moore 57 y.o. male is here because of SCC Tonsil  This is a very pleasant 57 yr old male patient with PMH significant for prostate cancer, amputation of toe, HTN , obesity referred to Head and neck oncology because of his new diagnosis of SCC tonsil. Many months ago, patient noticed some hoarseness and sore throat. He was referred to ENT doctor. He had a biopsy while he was in office. Biopsy showed small fragments of squamous mucosa concerning for SCC in situ. He then had PET CT imaging which showed right avid tonsillar  lesion and right nodal metastasis. He was reviewed in our H and N tumor board and the size of the tonsillar lesion is estimated to be above 4 cms. Given size of tumor greater than 4 cms, and positive LN, we have discussed that its best to proceed with concurrent chemoradiation.   He started his radiation on Monday, anticipate C1 of chemo tomorrow. No complaints today except for difficulty swallowing.  He is not able to drink much water, he says he is nauseated when he tries to drink water.  He has his G-tube, has his nutrition appointment tomorrow.  No other change in health issues today. Rest of the pertinent 10 point ROS reviewed and negative.   MEDICAL HISTORY:  Past Medical History:  Diagnosis Date   Abdominal pain    Cancer ([place])    prostate cancer   Complication of anesthesia    slow to wake up 3 years ago after foot surgery   ED (erectile dysfunction)    Gout    Gout    History of kidney stones    Hypertension    Obesity, Class II, BMI 35-39.9, with comorbidity    Painful orthopaedic hardware ([place])    left foot    SURGICAL HISTORY: Past Surgical History:  Procedure Laterality Date   amputation of 2 toes on left foot      CARPAL TUNNEL RELEASE  05/03/2011   Procedure: CARPAL TUNNEL RELEASE;  Surgeon: Wynonia Sours, MD;  Location: [facility];  Service: Orthopedics;  Laterality: Left;   FOOT FRACTURE SURGERY Left    x3   HARDWARE REMOVAL  Left 06/03/2019   Procedure: HARDWARE REMOVAL;  Surgeon: Erle Crocker, MD;  Location: [facility];  Service: Orthopedics;  Laterality: Left;  PROCEDURE: LEFT FOOT REMOVAL OF HARDWARE, IRRIGATION AND DEBRIDEMENT,PLACEMENT OF CEMENT SPACER, DEBULKIN, SOFT TISSUE MASS LENGTH OF SURGERY: 2 HOURS   HERNIA REPAIR  02/06/2009   Lap supraumb & umb VWH repairs   INCISION AND DRAINAGE OF WOUND Left 06/03/2019   Procedure: IRRIGATION AND DEBRIDEMENT WOUND WITH IMPLANTATION OF ANTIBIOTIC CEMENT SPACER;  Surgeon:  Erle Crocker, MD;  Location: [facility];  Service: Orthopedics;  Laterality: Left;   IR GASTROSTOMY TUBE MOD SED  05/29/2022   IR IMAGING GUIDED PORT INSERTION  05/29/2022   left rotator cuff surgery      LYMPHADENECTOMY Bilateral 02/07/2022   Procedure: BILATERAL PELVIC LYMPHADENECTOMY;  Surgeon: Raynelle Bring, MD;  Location: [facility];  Service: Urology;  Laterality: Bilateral;   MASS EXCISION Left 06/03/2019   Procedure: DEBULKING LEFT FOOT MASS;  Surgeon: Erle Crocker, MD;  Location: [facility];  Service: Orthopedics;  Laterality: Left;   ROBOT ASSISTED LAPAROSCOPIC RADICAL PROSTATECTOMY N/A 02/07/2022   Procedure: XI ROBOTIC ASSISTED LAPAROSCOPIC RADICAL PROSTATECTOMY LEVEL 3;  Surgeon: Raynelle Bring, MD;  Location: [facility];  Service: Urology;  Laterality: N/A;  210 MINUTES NEEDED FOR CASE    SOCIAL HISTORY: Social History   Socioeconomic History   Marital status: Married    Spouse name: Not on file   Number of children: Not on file   Years of education: Not on file   Highest education level: Not on file  Occupational History   Not on file  Tobacco Use   Smoking status: Never   Smokeless tobacco: Never  Vaping Use   Vaping Use: Never used  Substance and Sexual Activity   Alcohol use: No   Drug use: No   Sexual activity: Not on file  Other Topics Concern   Not on file  Social History Narrative   Not on file   Social Determinants of Health   Financial Resource Strain: Not on file  Food Insecurity: No Food Insecurity (02/07/2022)   Hunger Vital Sign    Worried About Running Out of Food in the Last Year: Never true    Ran Out of Food in the Last Year: Never true  Transportation Needs: No Transportation Needs (02/07/2022)   PRAPARE - Hydrologist (Medical): No    Lack of Transportation (Non-Medical): No  Physical Activity: Not on file  Stress: Not on file  Social Connections: Not on file  Intimate  Partner Violence: Not At Risk (02/07/2022)   Humiliation, Afraid, Rape, and Kick questionnaire    Fear of Current or Ex-Partner: No    Emotionally Abused: No    Physically Abused: No    Sexually Abused: No    FAMILY HISTORY: Family History  Problem Relation Age of Onset   Diabetes Mother    Diabetes Father     ALLERGIES:  has No Known Allergies.  MEDICATIONS:  Current Outpatient Medications  Medication Sig Dispense Refill   amLODipine-valsartan (EXFORGE) 10-320 MG tablet Take 1 tablet by mouth daily.     dexamethasone (DECADRON) 4 MG tablet Take 2 tablets daily x 3 days starting the day after cisplatin chemotherapy. Take with food. 30 tablet 1   docusate sodium (COLACE) 100 MG capsule Take 1 capsule (100 mg total) by mouth 2 (two) times daily.     lidocaine (XYLOCAINE) 2 %  solution Patient: Mix 1part 2% viscous lidocaine, 1part H20. Swish & swallow 76m of diluted mixture, 330m before meals and at bedtime, up to QID 200 mL 3   lidocaine-prilocaine (EMLA) cream Apply to affected area once 30 g 3   ondansetron (ZOFRAN) 8 MG tablet Take 1 tablet (8 mg total) by mouth every 8 (eight) hours as needed for nausea or vomiting. Start on the third day after cisplatin. 30 tablet 1   prochlorperazine (COMPAZINE) 10 MG tablet Take 1 tablet (10 mg total) by mouth every 6 (six) hours as needed (Nausea or vomiting). 30 tablet 1   Semaglutide-Weight Management (WEGOVY) 1.7 MG/0.75ML SOAJ Inject 1.7 mg into the skin once a week. 3 mL 1   Semaglutide-Weight Management (WEGOVY) 2.4 MG/0.75ML SOAJ 0.75 mL Subcutaneous weekly 30 day(s) 3 mL 5   sulfamethoxazole-trimethoprim (BACTRIM DS) 800-160 MG tablet Take 1 tablet by mouth 2 (two) times daily. Start the day prior to foley removal appointment 6 tablet 0   traMADol (ULTRAM) 50 MG tablet Take 1-2 tablets (50-100 mg total) by mouth every 6 (six) hours as needed for moderate pain or severe pain. 20 tablet 0   No current facility-administered medications for  this visit.    PHYSICAL EXAMINATION: ECOG PERFORMANCE STATUS: 0 - Asymptomatic  Vitals:   06/04/22 0841  BP: (!) 153/94  Pulse: 79  Resp: 16  Temp: 98.1 F (36.7 C)  SpO2: 100%   Filed Weights   06/04/22 0841  Weight: 271 lb (122.9 kg)    GENERAL:alert, no distress and comfortable SKIN: skin color, texture, turgor are normal, no rashes or significant lesions EYES: normal, conjunctiva are pink and non-injected, sclera clear NECK: supple, thyroid normal size, non-tender, without nodularity LYMPH:  no palpable lymphadenopathy in the cervical,  LUNGS: clear to auscultation and percussion with normal breathing effort HEART: regular rate & rhythm and no murmurs and no lower extremity edema ABDOMEN:abdomen soft, non-tender and normal bowel sounds Musculoskeletal:no cyanosis of digits and no clubbing  PSYCH: alert & oriented x 3 with fluent speech NEURO: no focal motor/sensory deficits  LABORATORY DATA:  I have reviewed the data as listed Lab Results  Component Value Date   WBC 8.1 06/04/2022   HGB 12.6 (L) 06/04/2022   HCT 37.2 (L) 06/04/2022   MCV 83.2 06/04/2022   PLT 219 06/04/2022     Chemistry      Component Value Date/Time   NA 139 05/29/2022 1045   K 3.9 05/29/2022 1045   CL 104 05/29/2022 1045   CO2 27 05/29/2022 1045   BUN 18 05/29/2022 1045   CREATININE 1.23 05/29/2022 1045   CREATININE 1.48 (H) 05/08/2022 1452      Component Value Date/Time   CALCIUM 9.1 05/29/2022 1045   ALKPHOS 103 05/08/2022 1452   AST 22 05/08/2022 1452   ALT 71 (H) 05/08/2022 1452   BILITOT 0.6 05/08/2022 1452       RADIOGRAPHIC STUDIES: I have personally reviewed the radiological images as listed and agreed with the findings in the report. IR Gastrostomy Tube  Result Date: 05/29/2022 CLINICAL DATA:  Left tonsillar squamous carcinoma and need for gastrostomy tube for nutrition during treatment. EXAM: PERCUTANEOUS GASTROSTOMY TUBE PLACEMENT ANESTHESIA/SEDATION: Moderate  (conscious) sedation was employed during this procedure. A total of Versed 1.0 mg and Fentanyl 100 mcg was administered intravenously by radiology nursing. Moderate Sedation Time: 15 minutes. The patient's level of consciousness and vital signs were monitored continuously by radiology nursing throughout the procedure under my direct supervision.  CONTRAST:  7m OMNIPAQUE IOHEXOL 300 MG/ML  SOLN MEDICATIONS: 3 g IV Ancef. IV antibiotic was administered in an appropriate time interval prior to needle puncture of the skin. FLUOROSCOPY TIME:  2 minutes and 48 seconds. 63 mGy. PROCEDURE: The procedure, risks, benefits, and alternatives were explained to the patient. Questions regarding the procedure were encouraged and answered. The patient understands and consents to the procedure. A time-out was performed prior to initiating the procedure. A 5-French catheter was then advanced through the patient's mouth under fluoroscopy into the esophagus and to the level of the stomach. This catheter was used to insufflate the stomach with air under fluoroscopy. The abdominal wall was prepped with chlorhexidine in a sterile fashion, and a sterile drape was applied covering the operative field. A sterile gown and sterile gloves were used for the procedure. Local anesthesia was provided with 1% Lidocaine. A skin incision was made in the upper abdominal wall. Under fluoroscopy, an 18 gauge trocar needle was advanced into the stomach. Contrast injection was performed to confirm intraluminal position of the needle tip. A single T tack was then deployed in the lumen of the stomach. This was brought up to tension at the skin surface. Over a guidewire, a 9-French sheath was advanced into the lumen of the stomach. The wire was left in place as a safety wire. A loop snare device from a percutaneous gastrostomy kit was then advanced into the stomach. A floppy guide wire was advanced through the orogastric catheter under fluoroscopy in the  stomach. The loop snare advanced through the percutaneous gastric access was used to snare the guide wire. This allowed withdrawal of the loop snare out of the patient's mouth by retraction of the orogastric catheter and wire. A 20-French bumper retention gastrostomy tube was looped around the snare device. It was then pulled back through the patient's mouth. The retention bumper was brought up to the anterior gastric wall. The T tack suture was cut at the skin. The exiting gastrostomy tube was cut to appropriate length and a feeding adapter applied. The catheter was injected with contrast material to confirm position and a fluoroscopic spot image saved. The tube was then flushed with saline. A dressing was applied over the gastrostomy exit site. COMPLICATIONS: None. FINDINGS: The stomach distended well with air allowing safe placement of the gastrostomy tube. After placement, the tip of the gastrostomy tube lies in the body of the stomach. IMPRESSION: Percutaneous gastrostomy with placement of a 20-French bumper retention tube in the body of the stomach. This tube can be used for percutaneous feeds beginning in 24 hours after placement. Electronically Signed   By: GAletta EdouardM.D.   On: 05/29/2022 14:18   IR IMAGING GUIDED PORT INSERTION  Result Date: 05/29/2022 CLINICAL DATA:  Left tonsillar squamous carcinoma and need for porta cath to begin chemotherapy. EXAM: IMPLANTED PORT A CATH PLACEMENT WITH ULTRASOUND AND FLUOROSCOPIC GUIDANCE ANESTHESIA/SEDATION: Moderate (conscious) sedation was employed during this procedure. A total of Versed 3.0 mg and Fentanyl 100 mcg was administered intravenously. Moderate Sedation Time: 30 minutes. The patient's level of consciousness and vital signs were monitored continuously by radiology nursing throughout the procedure under my direct supervision. FLUOROSCOPY: 30 seconds.  6.0 mGy. PROCEDURE: The procedure, risks, benefits, and alternatives were explained to the  patient. Questions regarding the procedure were encouraged and answered. The patient understands and consents to the procedure. A time-out was performed prior to initiating the procedure. Ultrasound was utilized to confirm patency of the right internal  jugular vein. A permanent ultrasound image was saved and recorded. The right neck and chest were prepped with chlorhexidine in a sterile fashion, and a sterile drape was applied covering the operative field. Maximum barrier sterile technique with sterile gowns and gloves were used for the procedure. Local anesthesia was provided with 1% lidocaine. After creating a small venotomy incision, a 21 gauge needle was advanced into the right internal jugular vein under direct, real-time ultrasound guidance. Ultrasound image documentation was performed. After securing guidewire access, an 8 Fr dilator was placed. A J-wire was kinked to measure appropriate catheter length. A subcutaneous port pocket was then created along the upper chest wall utilizing sharp and blunt dissection. Portable cautery was utilized. The pocket was irrigated with sterile saline. A single lumen power injectable port was chosen for placement. The 8 Fr catheter was tunneled from the port pocket site to the venotomy incision. The port was placed in the pocket. External catheter was trimmed to appropriate length based on guidewire measurement. At the venotomy, an 8 Fr peel-away sheath was placed over a guidewire. The catheter was then placed through the sheath and the sheath removed. Final catheter positioning was confirmed and documented with a fluoroscopic spot image. The port was accessed with a needle and aspirated and flushed with heparinized saline. The access needle was removed. The venotomy and port pocket incisions were closed with subcutaneous 3-0 Monocryl and subcuticular 4-0 Vicryl. Dermabond was applied to both incisions. COMPLICATIONS: COMPLICATIONS None FINDINGS: After catheter placement,  the tip lies at the cavo-atrial junction. The catheter aspirates normally and is ready for immediate use. IMPRESSION: Placement of single lumen port a cath via right internal jugular vein. The catheter tip lies at the cavo-atrial junction. A power injectable port a cath was placed and is ready for immediate use. Electronically Signed   By: Aletta Edouard M.D.   On: 05/29/2022 13:19    All questions were answered. The patient knows to call the clinic with any problems, questions or concerns. I spent 20 minutes in the care of this patient including H and P, review of records, counseling and coordination of care.     Benay Pike, MD 06/04/2022 8:48 AM

## 2022-06-05 ENCOUNTER — Other Ambulatory Visit: Payer: 59

## 2022-06-05 ENCOUNTER — Encounter: Payer: Self-pay | Admitting: General Practice

## 2022-06-05 ENCOUNTER — Other Ambulatory Visit: Payer: Self-pay

## 2022-06-05 ENCOUNTER — Inpatient Hospital Stay: Payer: 59

## 2022-06-05 ENCOUNTER — Ambulatory Visit
Admission: RE | Admit: 2022-06-05 | Discharge: 2022-06-05 | Disposition: A | Discharge status: Home / Self Care | Payer: 59 | Source: Ambulatory Visit | Attending: Radiation Oncology | Admitting: Radiation Oncology

## 2022-06-05 ENCOUNTER — Ambulatory Visit: Payer: 59 | Admitting: Hematology and Oncology

## 2022-06-05 ENCOUNTER — Inpatient Hospital Stay: Payer: 59 | Admitting: Dietician

## 2022-06-05 VITALS — BP 148/91 | HR 81 | Temp 98.1°F | Resp 18

## 2022-06-05 DIAGNOSIS — Z510 Encounter for antineoplastic radiation therapy: Secondary | ICD-10-CM | POA: Diagnosis not present

## 2022-06-05 DIAGNOSIS — C090 Malignant neoplasm of tonsillar fossa: Secondary | ICD-10-CM

## 2022-06-05 LAB — RAD ONC ARIA SESSION SUMMARY
Course Elapsed Days: 2
Plan Fractions Treated to Date: 3
Plan Prescribed Dose Per Fraction: 2 Gy
Plan Total Fractions Prescribed: 35
Plan Total Prescribed Dose: 70 Gy
Reference Point Dosage Given to Date: 6 Gy
Reference Point Session Dosage Given: 2 Gy
Session Number: 3

## 2022-06-05 MED ORDER — SODIUM CHLORIDE 0.9 % IV SOLN: Freq: Once | INTRAVENOUS | Status: AC

## 2022-06-05 MED ORDER — SODIUM CHLORIDE 0.9 % IV SOLN
40.0000 mg/m2 | Freq: Once | INTRAVENOUS | Status: AC
  Administered 2022-06-05: 100 mg via INTRAVENOUS
  Filled 2022-06-05: qty 100

## 2022-06-05 MED ORDER — PALONOSETRON HCL INJECTION 0.25 MG/5ML
0.2500 mg | Freq: Once | INTRAVENOUS | Status: AC
  Administered 2022-06-05: 0.25 mg via INTRAVENOUS
  Filled 2022-06-05: qty 5

## 2022-06-05 MED ORDER — SODIUM CHLORIDE 0.9 % IV SOLN
150.0000 mg | Freq: Once | INTRAVENOUS | Status: AC
  Administered 2022-06-05: 150 mg via INTRAVENOUS
  Filled 2022-06-05: qty 150

## 2022-06-05 MED ORDER — POTASSIUM CHLORIDE IN NACL 20-0.9 MEQ/L-% IV SOLN
Freq: Once | INTRAVENOUS | Status: AC
  Filled 2022-06-05: qty 1000

## 2022-06-05 MED ORDER — SODIUM CHLORIDE 0.9 % IV SOLN
10.0000 mg | Freq: Once | INTRAVENOUS | Status: AC
  Administered 2022-06-05: 10 mg via INTRAVENOUS
  Filled 2022-06-05: qty 10

## 2022-06-05 MED ORDER — MAGNESIUM SULFATE 2 GM/50ML IV SOLN
2.0000 g | Freq: Once | INTRAVENOUS | Status: AC
  Administered 2022-06-05: 2 g via INTRAVENOUS
  Filled 2022-06-05: qty 50

## 2022-06-05 MED ORDER — HEPARIN SOD (PORK) LOCK FLUSH 100 UNIT/ML IV SOLN
500.0000 [IU] | Freq: Once | INTRAVENOUS | Status: AC | PRN
  Administered 2022-06-05: 500 [IU]

## 2022-06-05 MED ORDER — SODIUM CHLORIDE 0.9% FLUSH
10.0000 mL | INTRAVENOUS | Status: DC | PRN
  Administered 2022-06-05: 10 mL

## 2022-06-05 NOTE — Progress Notes (Unsigned)
Nutrition Assessment   Reason for Assessment: HNC   ASSESSMENT: Patient with SCC of tonsil, P16 positive. He is receiving concurrent chemoradiation. S/p PEG 2/28.    Nutrition Focused Physical Exam: deferred   Medications: ***   Labs:    Anthropometrics:   Height: *** Weight: *** UBW: *** BMI: ***   Estimated Energy Needs  Kcals: *** Protein: *** Fluid: ***   NUTRITION DIAGNOSIS: ***   MALNUTRITION DIAGNOSIS: ***   INTERVENTION: ***   MONITORING, EVALUATION, GOAL: ***   Next Visit: ***

## 2022-06-05 NOTE — Therapy (Signed)
OUTPATIENT PHYSICAL THERAPY HEAD AND NECK BASELINE EVALUATION   Patient Name: Aaron Moore MRN: [id number] DOB:[birth year], 57 y.o., male Today's Date: 06/06/2022  END OF SESSION:  PT End of Session - 06/06/22 1002     Visit Number 1    Number of Visits 2    Date for PT Re-Evaluation 08/15/22    PT Start Time 0936    PT Stop Time 1001    PT Time Calculation (min) 25 min    Activity Tolerance Patient tolerated treatment well    Behavior During Therapy WFL for tasks assessed/performed             Past Medical History:  Diagnosis Date   Abdominal pain    Cancer ([place])    prostate cancer   Complication of anesthesia    slow to wake up 3 years ago after foot surgery   ED (erectile dysfunction)    Gout    Gout    History of kidney stones    Hypertension    Obesity, Class II, BMI 35-39.9, with comorbidity    Painful orthopaedic hardware ([place])    left foot   Past Surgical History:  Procedure Laterality Date   amputation of 2 toes on left foot      CARPAL TUNNEL RELEASE  05/03/2011   Procedure: CARPAL TUNNEL RELEASE;  Surgeon: Wynonia Sours, MD;  Location: [facility];  Service: Orthopedics;  Laterality: Left;   FOOT FRACTURE SURGERY Left    x3   HARDWARE REMOVAL Left 06/03/2019   Procedure: HARDWARE REMOVAL;  Surgeon: Erle Crocker, MD;  Location: [facility];  Service: Orthopedics;  Laterality: Left;  PROCEDURE: LEFT FOOT REMOVAL OF HARDWARE, IRRIGATION AND DEBRIDEMENT,PLACEMENT OF CEMENT SPACER, DEBULKIN, SOFT TISSUE MASS LENGTH OF SURGERY: 2 HOURS   HERNIA REPAIR  02/06/2009   Lap supraumb & umb VWH repairs   INCISION AND DRAINAGE OF WOUND Left 06/03/2019   Procedure: IRRIGATION AND DEBRIDEMENT WOUND WITH IMPLANTATION OF ANTIBIOTIC CEMENT SPACER;  Surgeon: Erle Crocker, MD;  Location: [facility];  Service: Orthopedics;  Laterality: Left;   IR GASTROSTOMY TUBE MOD SED  05/29/2022   IR IMAGING GUIDED PORT INSERTION   05/29/2022   left rotator cuff surgery      LYMPHADENECTOMY Bilateral 02/07/2022   Procedure: BILATERAL PELVIC LYMPHADENECTOMY;  Surgeon: Raynelle Bring, MD;  Location: [facility];  Service: Urology;  Laterality: Bilateral;   MASS EXCISION Left 06/03/2019   Procedure: DEBULKING LEFT FOOT MASS;  Surgeon: Erle Crocker, MD;  Location: [facility];  Service: Orthopedics;  Laterality: Left;   ROBOT ASSISTED LAPAROSCOPIC RADICAL PROSTATECTOMY N/A 02/07/2022   Procedure: XI ROBOTIC ASSISTED LAPAROSCOPIC RADICAL PROSTATECTOMY LEVEL 3;  Surgeon: Raynelle Bring, MD;  Location[facility];  Service: Urology;  Laterality: N/A;  Welcome CASE   Patient Active Problem List   Diagnosis Date Noted   Port-A-Cath in place 06/04/2022   Cancer of tonsillar fossa ([place]) 05/08/2022   Prostate cancer ([place]) 02/07/2022   Medial epicondylitis of left elbow 03/08/2015   Obesity, Class II, BMI 35-39.9, with comorbidity 11/28/2010   Abdominal wall mass of epigastric region - old hernia sac/lipoma 11/28/2010   Abdominal pain, RLQ - muscle strain 11/28/2010    PCP: Janie Morning, DO  REFERRING PROVIDER: Eppie Gibson, MD  REFERRING DIAG: C09.0 (ICD-10-CM) - Cancer of tonsillar fossa ([place])   THERAPY DIAG:  Abnormal posture  Cancer of tonsillar fossa ([place])  Rationale for Evaluation  and Treatment: Rehabilitation  ONSET DATE: 04/26/22  SUBJECTIVE:     SUBJECTIVE STATEMENT: Patient reports they are here today to be seen by their medical team for newly diagnosed cancer of R tonsil.    PERTINENT HISTORY:  SCC of his right tonsil, Stage II (T3, N1, M0, p 16 +). Presented on 04/26/22 with c/o of worsening right sided sore throat over the course of several months. The soreness had remained stable but increased in severity several weeks prior to this visit. He also reported some voice changes and odynophagia but denied dysphagia, weight loss, or history of smoking. Laryngoscopy performed revealed  an enlarged right tonsil with significant mass effect in the oropharynx/nasopharynx. Bilateral true vocal cord fullness was also appreciated. 04/26/22 Biopsy completed same day as ENT visit showed small fragments of squamous mucosa showing high-grade dysplasia/SCC in situ, p 16 +. 05/04/22 CT neck revealed he mass within the right oropharynx in the region of the right palatine tonsil as compatible with malignancy, measuring 3.7 x 3.4 cm. CT also showed one single enlarged right cervical chain level 2 lymph node measuring 2.0 x 1.2 cm. 05/04/22 PET demonstrated the right palatine tonsillar mass as FDG avid (SUV max 16.8), measuring 3.5 x 3.5 cm. PET also showed the right level 2 node concerning for nodal metastasis as FDG avid (SUV max 3.8).  Will receive 35 fractions of radiation to his tonsil and bilateral neck with weekly cisplatin. He started on 06/03/22 and will complete 07/19/22. PEG/PAC 05/29/22. He has a recent history of prostate cancer, stage T1c N0 M0. He is s/p radical prostatectomy and bilateral pelvic lymphadenectomy on 02/07/2022 with Dr. Alinda Money.  Final pathology from the procedure showed adenocarcinoma of the prostate with extraprostatic extension present at right anterolateral and  posterolateral mid and base. All margins negative, and all resected pelvic lymph nodes were negative. Started using cane 3-4 months ago.      PATIENT GOALS:   to be educated about the signs and symptoms of lymphedema and learn post op HEP.   PAIN:  Are you having pain? Yes: NPRS scale: 3/10 Pain location: abdomen due to recent placement of feeding tube Pain description: extreme discomfort Aggravating factors: eating Relieving factors: time  PRECAUTIONS: Active CA and Comment (recent radical prostactomy and bilateral pelvic lymphadenectomy) 2 toes amputated on L foot affects balance  WEIGHT BEARING RESTRICTIONS: No  FALLS:  Has patient fallen in last 6 months? No Does the patient have a fear of falling that limits  activity? No just more conscious of falling secondary to toe amputation Is the patient reluctant to leave the house due to a fear of falling?No  LIVING ENVIRONMENT: Patient lives with: wife Lives in: House/apartment Has following equipment at home: Single point cane  OCCUPATION: self employed works part time  [employer]: pt does not formally exercise, pt reports he is very active  PRIOR LEVEL OF FUNCTION: Independent   OBJECTIVE:  COGNITION: Overall cognitive status: Within functional limits for tasks assessed                  POSTURE:  Forward head and rounded shoulders posture  30 SEC SIT TO STAND: 10 reps in 30 sec without use of UEs which is  Poor for patient's age  SHOULDER AROM:   WFL pt reports he has decreased strength in his L shoulder after having surgery for torn rotator cuff   CERVICAL AROM:   Percent limited  Flexion [facility]  Extension WFL  Right lateral flexion [facility]  Left lateral flexion WFL  Right rotation WFL  Left rotation WFL    (Blank rows=not tested)  GAIT: Assessed: Yes Assistance needed: Independent pt does report shortness of breath with ambulation Ambulation Distance: 20 feet Assistive Device: straight cane Gait pattern: Decreased stride length and Antalgic Ambulation surface: Level  PATIENT EDUCATION:  Education details: Neck ROM, importance of posture when sitting, standing and lying down, deep breathing, walking program and importance of staying active throughout treatment, CURE article on staying active, "Why exercise?" flyer, lymphedema and PT info Person educated: Patient Education method: Explanation, Demonstration, Handout Education comprehension: Patient verbalized understanding and returned demonstration  HOME EXERCISE PROGRAM: Patient was instructed today in a home exercise program today for head and neck range of motion exercises. These included active cervical flexion, active cervical extension, active cervical rotation to each  direction, upper trap stretch, and shoulder retraction. Patient was encouraged to do these 2-3 times a day, holding for 5 sec each and completing for 5 reps. Pt was educated that once this becomes easier then hold the stretches for 30-60 seconds.    ASSESSMENT:  CLINICAL IMPRESSION: Pt arrives to PT with recently diagnosed R tonsillar cancer cancer. P will receive 35 fractions of radiation to his tonsil and bilateral neck with weekly cisplatin. He started on 06/03/22 and will complete 07/19/22.Marland Kitchen  [name] cervical ROM was [facility]. He is at risk of lymphedema of his neck but also bilateral legs secondary to recent lymphadenectomy of bilateral pelvic nodes.  Educated pt about signs and symptoms of lymphedema as well as anatomy and physiology of lymphatic system. Educated pt in importance of staying as active as possible throughout treatment to decrease fatigue as well as head and neck ROM exercises to decrease loss of ROM. Will see pt after completion of radiation to reassess ROM and assess for lymphedema and to determine therapy needs at that time.  Pt will benefit from skilled therapeutic intervention to improve on the following deficits: Decreased knowledge of precautions and postural dysfunction.   PT treatment/interventions: ADL/self-care home management, pt/family education, therapeutic exercise.  REHAB POTENTIAL: Good  CLINICAL DECISION MAKING: Stable/uncomplicated  EVALUATION COMPLEXITY: Low   GOALS: Goals reviewed with patient? YES  LONG TERM GOALS: (STG=LTG)   Name Target Date  Goal status  1 Patient will be able to verbalize understanding of a home exercise program for cervical range of motion, posture, and walking.   Baseline:  No knowledge 06/06/2022 Achieved at eval  2 Patient will be able to verbalize understanding of proper sitting and standing posture. Baseline:  No knowledge 06/06/2022 Achieved at eval  3 Patient will be able to verbalize understanding of lymphedema risk and  availability of treatment for this condition Baseline:  No knowledge 06/06/2022 Achieved at eval  4 Pt will demonstrate a return to full cervical ROM and function post operatively compared to baselines and not demonstrate any signs or symptoms of lymphedema.  Baseline: See objective measurements taken today. 08/15/22 New    PLAN:  PT FREQUENCY/DURATION: EVAL and 1 follow up appointment.   PLAN FOR NEXT SESSION: will reassess 2 weeks after completion of radiation to determine needs.  Patient will follow up at outpatient cancer rehab 2 weeks after completion of radiation.  If the patient requires physical therapy at that time, a specific plan will be dictated and sent to the referring physician for approval. The patient was educated today on appropriate basic range of motion exercises to begin now and continue throughout radiation and educated on the signs  and symptoms of lymphedema. Patient verbalized good understanding.     Physical Therapy Information for During and After Head/Neck Cancer Treatment: Lymphedema is a swelling condition that you may be at risk for in your neck and/or face if you have radiation treatment to the area and/or if you have surgery that includes removing lymph nodes.  There is treatment available for this condition and it is not life-threatening.  Contact your physician or physical therapist with concerns. An excellent resource for those seeking information on lymphedema is the National Lymphedema Network's website.  It can be accessed at [place].org If you notice swelling in your neck or face at any time following surgery (even if it is many years from now), please contact your doctor or physical therapist to discuss this.  Lymphedema can be treated at any time but it is easier for you if it is treated early on. If you have had surgery to your neck, please check with your surgeon about how soon to start doing neck range of motion exercises.  If you are not having  surgery, I encourage you to start doing neck range of motion exercises today and continue these while undergoing treatment, UNLESS you have irritation of your skin or soft tissue that is aggravated by doing them.  These exercises are intended to help you prevent loss of range of motion and/or to gain range of motion in your neck (which can be limited by tightening effects of radiation), and NOT to aggravate these tissues if they develop sensitivities from treatment. Neck range of motion exercises should be done to the point of feeling a GENTLE, TOLERABLE stretch only.  You are encouraged to start a walking or other exercise program tomorrow and continue this as much as you are able through and after treatment.  Please feel free to call me with any questions. Manus Gunning, PT, CLT Physical Therapist and Certified Lymphedema Therapist [facility] [address], [address], [place], [place] [address] [phone] [email or web address]  WALKING  Walking is a great form of exercise to increase your strength, endurance and overall fitness.  A walking program can help you start slowly and gradually build endurance as you go.  Everyone's ability is different, so each person's starting point will be different.  You do not have to follow them exactly.  The are just samples. You should simply find out what's right for you and stick to that program.   In the beginning, you'll start off walking 2-3 times a day for short distances.  As you get stronger, you'll be walking further at just 1-2 times per day.  A. You Can Walk For A Certain Length Of Time Each Day    Walk 5 minutes 3 times per day.  Increase 2 minutes every 2 days (3 times per day).  Work up to 25-30 minutes (1-2 times per day).   Example:   Day 1-2 5 minutes 3 times per day   Day 7-8 12 minutes 2-3 times per day   Day 13-14 25 minutes 1-2 times per day  B. You Can Walk For a Certain Distance Each  Day     Distance can be substituted for time.    Example:   3 trips to mailbox (at road)   3 trips to corner of block   3 trips around the block  C. Go to local high school and use the track.    Walk for distance ____ around track  Or time ____ minutes  D. Walk ____ Jog ____ Run ___   Why exercise?  So many benefits! Here are SOME of them: Heart health, including raising your good cholesterol level and reducing heart rate and blood pressure Lung health, including improved lung capacity It burns fats, and most of [place] can stand to be leaner, whether or not we are overweight. It increases the body's natural painkillers and mood elevators, so makes you feel better. Not only makes you feel better, but look better too Improves sleep Takes a bite out of stress May decrease your risk of many types of cancer If you are currently undergoing cancer treatment, exercise may improve your ability to tolerate treatments including chemotherapy. For everybody, it can improve your energy level. Those with cancer-related fatigue report a 40-50% reduction in this symptom when exercising regularly. If you are a survivor of breast, colon, or prostate cancer, it may decrease your risk of a recurrence. (This may hold for other cancers too, but so far we have data just for these three types.)  How to exercise: Get your doctor's okay. Pick something you enjoy doing, like walking, Zumba, biking, swimming, or whatever. Start at low intensity and time, then gradually increase.  (See walking program handout.) Set a goal to achieve over time.  The American Cancer Society, American Heart Association, and U.S. Dept. of Health and Human Services recommend 150 minutes of moderate exercise, 75 minutes of vigorous exercise, or a combination of both per week. This should be done in episodes at least 10 minutes long, spread throughout the week.  Need help being motivated? Pick something you enjoy doing, because you'll  be more inclined to stick with that activity than something that feels like a chore. Do it with a friend so that you are accountable to each other. Schedule it into your day. Place it on your calendar and keep that appointment just like you do any appointment that you make. Join an exercise group that meets at a specific time.  That way, you have to show up on time, and that makes it harder to procrastinate about doing your workout.  It also keeps you accountable--people begin to expect you to be there. Join a gym where you feel comfortable and not intimidated, at the right cost. Sign up for something that you'll need to be in shape for on a specific date, like a 1K or a 5K to walk or run, a 20 or 30 mile bike ride, a mud run or something like that. If the date is looming, you know you'll need to train to be ready for it.  An added benefit is that many of these are fundraisers for good causes. If you've already paid for a gym membership, group exercise class or event, you might as well work out, so you haven't wasted your money!    [employer], PT 06/06/2022, 10:03 AM

## 2022-06-05 NOTE — Progress Notes (Signed)
[facility] Spiritual Care Note  Followed up with Aaron Aaron Moore in infusion in order to meet in person. He used the opportunity to share and process his stressors related to diagnoses and treatments, as well as the faith that helps him cope as he tries to make meaning of his situation. He notes that has lost both of his parents and does not have siblings or children. His wife is a key source of support.  Brought a prayer shawl as a tangible sign of encouragement and comfort, as well as a packet of [place] and [place]. Provided pastoral presence, reflective listening, emotional support, normalization of feelings, and affirmation of strengths, particularly his reflection and engagement.   Plan to follow up at a future treatment, and Aaron Moore has direct Spiritual Care number in case needs arise in the meantime.   [place], [place], [facility] Pager [phone] Voicemail [phone]

## 2022-06-05 NOTE — Patient Instructions (Signed)
[facility]  Discharge Instructions: Thank you for choosing [facility] to provide your oncology and hematology care.   If you have a lab appointment with the [facility], please go directly to the [facility] and check in at the registration area.   Wear comfortable clothing and clothing appropriate for easy access to any Portacath or PICC line.   We strive to give you quality time with your provider. You may need to reschedule your appointment if you arrive late (15 or more minutes).  Arriving late affects you and other patients whose appointments are after yours.  Also, if you miss three or more appointments without notifying the office, you may be dismissed from the clinic at the provider's discretion.      For prescription refill requests, have your pharmacy contact our office and allow 72 hours for refills to be completed.    Today you received the following chemotherapy and/or immunotherapy agents Cisplatin      To help prevent nausea and vomiting after your treatment, we encourage you to take your nausea medication as directed.  BELOW ARE SYMPTOMS THAT SHOULD BE REPORTED IMMEDIATELY: *FEVER GREATER THAN 100.4 F (38 C) OR HIGHER *CHILLS OR SWEATING *NAUSEA AND VOMITING THAT IS NOT CONTROLLED WITH YOUR NAUSEA MEDICATION *UNUSUAL SHORTNESS OF BREATH *UNUSUAL BRUISING OR BLEEDING *URINARY PROBLEMS (pain or burning when urinating, or frequent urination) *BOWEL PROBLEMS (unusual diarrhea, constipation, pain near the anus) TENDERNESS IN MOUTH AND THROAT WITH OR WITHOUT PRESENCE OF ULCERS (sore throat, sores in mouth, or a toothache) UNUSUAL RASH, SWELLING OR PAIN  UNUSUAL VAGINAL DISCHARGE OR ITCHING   Items with * indicate a potential emergency and should be followed up as soon as possible or go to the Emergency Department if any problems should occur.  Please show the CHEMOTHERAPY ALERT CARD or IMMUNOTHERAPY ALERT CARD at  check-in to the Emergency Department and triage nurse.  Should you have questions after your visit or need to cancel or reschedule your appointment, please contact [facility]  Dept: [phone]  and follow the prompts.  Office hours are 8:00 a.m. to 4:30 p.m. Monday - Friday. Please note that voicemails left after 4:00 p.m. may not be returned until the following business day.  We are closed weekends and major holidays. You have access to a nurse at all times for urgent questions. Please call the main number to the clinic Dept: [phone] and follow the prompts.   For any non-urgent questions, you may also contact your provider using MyChart. We now offer e-Visits for anyone 92 and older to request care online for non-urgent symptoms. For details visit [email or web address].   Also download the MyChart app! Go to the app store, search "MyChart", open the app, select [facility], and log in with your MyChart username and password.

## 2022-06-06 ENCOUNTER — Other Ambulatory Visit: Payer: Self-pay

## 2022-06-06 ENCOUNTER — Ambulatory Visit
Admission: RE | Admit: 2022-06-06 | Discharge: 2022-06-06 | Disposition: A | Discharge status: Home / Self Care | Payer: 59 | Source: Ambulatory Visit | Attending: Radiation Oncology | Admitting: Radiation Oncology

## 2022-06-06 ENCOUNTER — Ambulatory Visit: Payer: 59 | Admitting: Physical Therapy

## 2022-06-06 ENCOUNTER — Ambulatory Visit: Payer: 59 | Attending: Radiation Oncology

## 2022-06-06 ENCOUNTER — Encounter: Payer: Self-pay | Admitting: Physical Therapy

## 2022-06-06 ENCOUNTER — Telehealth: Payer: Self-pay

## 2022-06-06 ENCOUNTER — Inpatient Hospital Stay: Payer: 59 | Admitting: Licensed Clinical Social Worker

## 2022-06-06 ENCOUNTER — Ambulatory Visit: Payer: 59 | Attending: Radiation Oncology | Admitting: Physical Therapy

## 2022-06-06 DIAGNOSIS — C090 Malignant neoplasm of tonsillar fossa: Secondary | ICD-10-CM | POA: Diagnosis present

## 2022-06-06 DIAGNOSIS — R293 Abnormal posture: Secondary | ICD-10-CM | POA: Insufficient documentation

## 2022-06-06 DIAGNOSIS — R1310 Dysphagia, unspecified: Secondary | ICD-10-CM | POA: Diagnosis present

## 2022-06-06 DIAGNOSIS — Z510 Encounter for antineoplastic radiation therapy: Secondary | ICD-10-CM | POA: Diagnosis not present

## 2022-06-06 LAB — RAD ONC ARIA SESSION SUMMARY
Course Elapsed Days: 3
Plan Fractions Treated to Date: 4
Plan Prescribed Dose Per Fraction: 2 Gy
Plan Total Fractions Prescribed: 35
Plan Total Prescribed Dose: 70 Gy
Reference Point Dosage Given to Date: 8 Gy
Reference Point Session Dosage Given: 2 Gy
Session Number: 4

## 2022-06-06 NOTE — Telephone Encounter (Signed)
-----   Message from Casandra Doffing, RN sent at 06/05/2022  3:48 PM EST ----- Regarding: Dr Chryl Heck first time cisplatin f/u Dr Chryl Heck pt here for first time cisplatin. Pt tolerated tx well without incident. Pt callback due.

## 2022-06-06 NOTE — Patient Instructions (Signed)
  SWALLOWING EXERCISES Do these until 6 months after your last day of radiation, then 2-3 times per week afterwards  Effortful Swallows - Press your tongue against the roof of your mouth for 3 seconds, then squeeze the muscles in your neck while you swallow your saliva or a sip of water - Repeat 10-15 times, 2-3 times a day, and use whenever you eat or drink  [place] Swallow - swallow with your tongue sticking out - Stick tongue out past your lips and gently bite tongue with your teeth - Swallow, while holding your tongue with your teeth - Repeat 10-15 times, 2-3 times a day *use a wet spoon if your mouth gets dry*  Shaker Exercise - head lift - Lie flat on your back in your bed or on a couch without pillows - Raise your head and look at your feet - KEEP YOUR SHOULDERS DOWN - HOLD FOR 45-60 SECONDS, then lower your head back down - Repeat 3 times, 2-3 times a day  [employer] - "half swallow" exercise - Start to swallow, and keep your Adam's apple up by squeezing hard with the muscles of the throat - Hold the squeeze for 5-7 seconds and then relax - Repeat 10-15 times, 2-3 times a day *use a wet spoon if your mouth gets dry*   "Super Swallow"  - Take a breath and hold it (sip here if needed)  - Swallow then IMMEDIATELY cough  - Repeat 10 times, 2-3 times a day

## 2022-06-06 NOTE — Telephone Encounter (Signed)
Wife Aaron Moore states that Aaron Moore is doing fairly well. He had some nausea last night but no vomiting. Did not use compazine. Toook decadron this am as scheduled.  He is eating small amounts, drinking, and urinating well. They know to call the office  at [phone] if they have any questions or concerns.

## 2022-06-06 NOTE — Progress Notes (Signed)
Oncology Nurse Navigator Documentation   I met with Aaron Moore before his appointments with SLP and PT today during Head and Neck [facility]. He is tolerating treatment well but does report difficulty sleeping at night. He knows to call me if he has any questions or concerns as he proceeds with treatment.  [name] RN, BSN, OCN Head & Neck Oncology Nurse [place] at [facility] Phone # [phone]  Fax # [phone]

## 2022-06-06 NOTE — Progress Notes (Signed)
[facility] Work  Clinical Social Work was referred by Art therapist for assessment of psychosocial needs.  Clinical Social Worker met with patient  to offer support and assess for needs during H&N [facility].  CSW informed patient of role of the CSW and support team. Pt denied practical needs today. 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  Pt voiced challenges he is facing in coping with all of these health issues and feeling that he is withdrawing from people and that that may impact his relationships.  CSW provided empathic listening, normalized feelings, and began work on reframing voicing needs as being a gift to loved ones, even if it is just for them to sit near him.  His faith is important to him as well and he has been meeting with our chaplain.   CSW provided contact information and encouraged patient to call with any questions or concerns. Will confer with chaplain regarding follow-up.      [place], [place] Worker [employer]

## 2022-06-06 NOTE — Therapy (Signed)
OUTPATIENT SPEECH LANGUAGE PATHOLOGY SWALLOW EVALUATION   Patient Name: Aaron Moore MRN: [id number] DOB:[birth year], 57 y.o., male Today's Date: 06/06/2022  PCP: Janie Morning REFERRING PROVIDER: Eppie Gibson, MD  END OF SESSION:  End of Session - 06/06/22 1234     Visit Number 1    Number of Visits 7    Date for SLP Re-Evaluation 09/04/22    SLP Start Time 0806    SLP Stop Time  [id number]    SLP Time Calculation (min) 44 min    Activity Tolerance Patient tolerated treatment well             Past Medical History:  Diagnosis Date   Abdominal pain    Cancer ([place])    prostate cancer   Complication of anesthesia    slow to wake up 3 years ago after foot surgery   ED (erectile dysfunction)    Gout    Gout    History of kidney stones    Hypertension    Obesity, Class II, BMI 35-39.9, with comorbidity    Painful orthopaedic hardware ([place])    left foot   Past Surgical History:  Procedure Laterality Date   amputation of 2 toes on left foot      CARPAL TUNNEL RELEASE  05/03/2011   Procedure: CARPAL TUNNEL RELEASE;  Surgeon: Wynonia Sours, MD;  Location: [place];  Service: Orthopedics;  Laterality: Left;   FOOT FRACTURE SURGERY Left    x3   HARDWARE REMOVAL Left 06/03/2019   Procedure: HARDWARE REMOVAL;  Surgeon: Erle Crocker, MD;  Location: [place];  Service: Orthopedics;  Laterality: Left;  PROCEDURE: LEFT FOOT REMOVAL OF HARDWARE, IRRIGATION AND DEBRIDEMENT,PLACEMENT OF CEMENT SPACER, DEBULKIN, SOFT TISSUE MASS LENGTH OF SURGERY: 2 HOURS   HERNIA REPAIR  02/06/2009   Lap supraumb & umb VWH repairs   INCISION AND DRAINAGE OF WOUND Left 06/03/2019   Procedure: IRRIGATION AND DEBRIDEMENT WOUND WITH IMPLANTATION OF ANTIBIOTIC CEMENT SPACER;  Surgeon: Erle Crocker, MD;  Location: [place];  Service: Orthopedics;  Laterality: Left;   IR GASTROSTOMY TUBE MOD SED  05/29/2022   IR IMAGING GUIDED PORT INSERTION   05/29/2022   left rotator cuff surgery      LYMPHADENECTOMY Bilateral 02/07/2022   Procedure: BILATERAL PELVIC LYMPHADENECTOMY;  Surgeon: Raynelle Bring, MD;  Location: [facility];  Service: Urology;  Laterality: Bilateral;   MASS EXCISION Left 06/03/2019   Procedure: DEBULKING LEFT FOOT MASS;  Surgeon: Erle Crocker, MD;  Location: [place];  Service: Orthopedics;  Laterality: Left;   ROBOT ASSISTED LAPAROSCOPIC RADICAL PROSTATECTOMY N/A 02/07/2022   Procedure: XI ROBOTIC ASSISTED LAPAROSCOPIC RADICAL PROSTATECTOMY LEVEL 3;  Surgeon: Raynelle Bring, MD;  Location: [facility];  Service: Urology;  Laterality: N/A;  [place] CASE   Patient Active Problem List   Diagnosis Date Noted   Port-A-Cath in place 06/04/2022   Cancer of tonsillar fossa ([place]) 05/08/2022   Prostate cancer ([place]) 02/07/2022   Medial epicondylitis of left elbow 03/08/2015   Obesity, Class II, BMI 35-39.9, with comorbidity 11/28/2010   Abdominal wall mass of epigastric region - old hernia sac/lipoma 11/28/2010   Abdominal pain, RLQ - muscle strain 11/28/2010    REFERRING DIAG: C09.0 (ICD-10-CM) - Cancer of tonsillar fossa   THERAPY DIAG:  Dysphagia, unspecified type  Rationale for Evaluation and Treatment: Rehabilitation  SUBJECTIVE:   SUBJECTIVE STATEMENT: Flat affect today. Pt is eating lesser-dense foods for last 4-6  weeks. If he eats meat he is cutting into smaller bite sizes and is chewing very thoroughly.  Pt accompanied by: self  PERTINENT HISTORY:  SCC of his right tonsil, Stage II (T3, N1, M0, p 16 +).H e presented to Dr. Stanford Breed [facility]) on 04/26/22 with c/o of worsening right sided sore throat over the course of several months. The soreness had remained stable but increased in severity several weeks prior to this visit. He also reported some voice changes and odynophagia but denied dysphagia, weight loss, or history of smoking. Laryngoscopy performed revealed an enlarged  right tonsil with significant mass effect in the oropharynx/nasopharynx. Bilateral true vocal cord fullness was also appreciated. 04/26/22 Biopsy completed same day as ENT visit showed small fragments of squamous mucosa showing high-grade dysplasia/SCC in situ, p 16 +. 05/04/22 CT neck revealed he mass within the right oropharynx in the region of the right palatine tonsil as compatible with malignancy, measuring 3.7 x 3.4 cm. CT also showed one single enlarged right cervical chain level 2 lymph node measuring 2.0 x 1.2 cm. 05/04/22 PET demonstrated the right palatine tonsillar mass as FDG avid (SUV max 16.8), measuring 3.5 x 3.5 cm. PET also showed the right level 2 node concerning for nodal metastasis as FDG avid (SUV max 3.8). 2/7/ Consult with Dr. Isidore Moos & Dr. Chryl Heck. (He will receive chemotherapy and radiation). Treatment plan: to receive 35 fractions of radiation to his tonsil and bilateral neck with weekly cisplatin. He started on 06/03/22 and will complete 07/19/22. PEG/PAC placed on 05/29/22  PAIN:  Are you having pain? Yes: NPRS scale: 3/10 Pain location: mid section Pain description: sore Aggravating factors: swallowing Relieving factors: not swallowing  FALLS: Has patient fallen in last 6 months?  No  LIVING ENVIRONMENT: Lives with: lives with their spouse Lives in: House/apartment  PLOF:  Level of assistance: Independent with ADLs, Independent with IADLs Employment: Other: between jobs  PATIENT GOALS: Maintain normal swallowing  OBJECTIVE:   DIAGNOSTIC FINDINGS: See above in "pertinent history"  COGNITION: Overall cognitive status: Within functional limits for tasks assessed  ORAL MOTOR EXAMINATION: Overall status: WFL  CLINICAL SWALLOW ASSESSMENT:   Current diet: Dysphagia 3 (mechanical soft) and thin liquids with some regular food items; pt avoiding dense foods and chewing these items thoroughly and this normalizes pharyngeal transit Dentition: adequate natural dentition Patient  directly observed with POs: Yes: dysphagia 3 (soft) and thin liquids  Feeding: able to feed self Liquids provided by: cup Oral phase signs and symptoms:  none noted Pharyngeal phase signs and symptoms:  complaints of "sinus drainage" 10 minutes after POs. Pt states this has  not occurred prior - ? secretion retention in pharyngx/nasopharynx due to tumor location? If so, this should subside as ChRT progresses.  TODAY'S TREATMENT:  DATE:  06/06/22 (eval): Research states the risk for dysphagia increases due to radiation and/or chemotherapy treatment due to a variety of factors, so SLP educated the pt about the possibility of reduced/limited ability for PO intake during rad tx. SLP also educated pt regarding possible changes to swallowing musculature after rad tx, and why adherence to dysphagia HEP provided today and PO consumption was necessary to inhibit muscle fibrosis following rad tx and to mitigate muscle disuse atrophy. SLP informed pt why this would be detrimental to their swallowing status and to their pulmonary health. Pt demonstrated understanding of these things to SLP. SLP encouraged pt to safely eat and drink as deep into their radiation/chemotherapy as possible to provide the best possible long-term swallowing outcome for pt.  SLP then developed an individualized HEP for pt involving oral and pharyngeal strengthening and ROM and pt was instructed how to perform these exercises, including SLP demonstration. After SLP demonstration, pt return demonstrated each exercise. SLP ensured pt performance was correct prior to educating pt on next exercise. Pt required occasional min-mod cues faded to modified independent to perform HEP. Pt was instructed to complete this program at least 6 days/week, at least 2 times a day until 6 months after his last day of rad tx and then  x2 a week after that, indefinitely. Among other modifications for days when pt cannot functionally swallow, SLP also suggested pt  cycle through the swallowing portion so the full program of exercises can be completed instead of fatiguing on one of the swallowing exercises and being unable to perform the other swallowing exercises. SLP instructed that swallowing exercise reps should then be increased back into the regimen as pt is able to do so.     PATIENT EDUCATION: Education details: late effects head/neck radiation on swallow function, HEP procedure, and modification to HEP when difficulty experienced with swallowing during and after radiation course Person educated: Patient Education method: Consulting civil engineer, Demonstration, Verbal cues, and Handouts Education comprehension: verbalized understanding, returned demonstration, verbal cues required, and needs further education     ASSESSMENT:   CLINICAL IMPRESSION: Patient is a 57 y.o. male who was seen today for treatment of swallowing as they undergo chemoradiation therapy. Today pt ate/drank a [place] sandwich and thin liquids without overt s/s oral or pharyngeal difficulty, however complained of "sinus drainage" after 10 minutes of POs. At this time pt swallowing is deemed WNL/WFL with these POs. There are no overt s/s aspiration PNA observed by SLP nor any reported by pt at this time. Data indicate that pt's swallow ability will likely decrease over the course of radiation/chemoradiation therapy and could very well decline over time following the conclusion of that therapy due to muscle disuse atrophy and/or muscle fibrosis. Pt will cont to need to be seen by SLP in order to assess safety of PO intake, assess the need for recommending any objective swallow assessment, and ensuring pt is correctly completing the individualized HEP. If pt cont to complain of "sinus drainage" after POs, a modified barium swallow eval may be necessary.   OBJECTIVE  IMPAIRMENTS: include dysphagia. These impairments are limiting patient from safety when swallowing. Factors affecting potential to achieve goals and functional outcome are  none noted today . Patient will benefit from skilled SLP services to address above impairments and improve overall function.   REHAB POTENTIAL: Good     GOALS: Goals reviewed with patient? Yes   SHORT TERM GOALS: Target: 3rd total session   Pt will compelte HEP with modified  independence in 2 sessions Baseline: Goal status: Initial   2.  pt will tell SLP why pt is completing HEP with modified independence Baseline:  Goal status: Initial   3.  pt will describe 3 overt s/s aspiration PNA with modified independence Baseline:  Goal status: Initial   4.  pt will tell SLP how a food journal could hasten return to a more normalized diet Baseline:  Goal status: Initial     LONG TERM GOALS: Target: 7th total session   1.  pt will complete HEP with independence over two visits Baseline:  Goal status: Initial   2.  pt will describe how to modify HEP over time, and the timeline associated with reduction in HEP frequency with modified independence over two sessions Baseline:  Goal status: Initial   PLAN:   SLP FREQUENCY:  once approx every 4 weeks   SLP DURATION:  6 sessions   PLANNED INTERVENTIONS: Aspiration precaution training, Pharyngeal strengthening exercises, Diet toleration management , Environmental controls, Trials of upgraded texture/liquids, Cueing hierachy, Internal/external aids, SLP instruction and feedback, Compensatory strategies, and Patient/family education   [facility], [place] 06/06/2022, 12:35 PM

## 2022-06-07 ENCOUNTER — Other Ambulatory Visit: Payer: Self-pay

## 2022-06-07 ENCOUNTER — Ambulatory Visit
Admission: RE | Admit: 2022-06-07 | Discharge: 2022-06-07 | Disposition: A | Discharge status: Home / Self Care | Payer: 59 | Source: Ambulatory Visit | Attending: Radiation Oncology | Admitting: Radiation Oncology

## 2022-06-07 DIAGNOSIS — Z510 Encounter for antineoplastic radiation therapy: Secondary | ICD-10-CM | POA: Diagnosis not present

## 2022-06-07 LAB — RAD ONC ARIA SESSION SUMMARY
Course Elapsed Days: 4
Plan Fractions Treated to Date: 5
Plan Prescribed Dose Per Fraction: 2 Gy
Plan Total Fractions Prescribed: 35
Plan Total Prescribed Dose: 70 Gy
Reference Point Dosage Given to Date: 10 Gy
Reference Point Session Dosage Given: 2 Gy
Session Number: 5

## 2022-06-10 ENCOUNTER — Other Ambulatory Visit: Payer: Self-pay

## 2022-06-10 ENCOUNTER — Other Ambulatory Visit: Payer: Self-pay | Admitting: *Deleted

## 2022-06-10 ENCOUNTER — Inpatient Hospital Stay: Payer: 59

## 2022-06-10 ENCOUNTER — Inpatient Hospital Stay (HOSPITAL_BASED_OUTPATIENT_CLINIC_OR_DEPARTMENT_OTHER): Payer: 59 | Admitting: Hematology and Oncology

## 2022-06-10 ENCOUNTER — Encounter: Payer: Self-pay | Admitting: Hematology and Oncology

## 2022-06-10 ENCOUNTER — Ambulatory Visit
Admission: RE | Admit: 2022-06-10 | Discharge: 2022-06-10 | Disposition: A | Discharge status: Home / Self Care | Payer: 59 | Source: Ambulatory Visit | Attending: Radiation Oncology | Admitting: Radiation Oncology

## 2022-06-10 DIAGNOSIS — C090 Malignant neoplasm of tonsillar fossa: Secondary | ICD-10-CM

## 2022-06-10 DIAGNOSIS — Z5111 Encounter for antineoplastic chemotherapy: Secondary | ICD-10-CM

## 2022-06-10 DIAGNOSIS — Z510 Encounter for antineoplastic radiation therapy: Secondary | ICD-10-CM | POA: Diagnosis not present

## 2022-06-10 DIAGNOSIS — R1906 Epigastric swelling, mass or lump: Secondary | ICD-10-CM

## 2022-06-10 DIAGNOSIS — Z95828 Presence of other vascular implants and grafts: Secondary | ICD-10-CM

## 2022-06-10 LAB — CBC WITH DIFFERENTIAL (CANCER CENTER ONLY)
Abs Immature Granulocytes: 0.13 10*3/uL — ABNORMAL HIGH (ref 0.00–0.07)
Basophils Absolute: 0 10*3/uL (ref 0.0–0.1)
Basophils Relative: 0 %
Eosinophils Absolute: 0 10*3/uL (ref 0.0–0.5)
Eosinophils Relative: 0 %
HCT: 37.4 % — ABNORMAL LOW (ref 39.0–52.0)
Hemoglobin: 12.8 g/dL — ABNORMAL LOW (ref 13.0–17.0)
Immature Granulocytes: 1 %
Lymphocytes Relative: 5 %
Lymphs Abs: 0.6 10*3/uL — ABNORMAL LOW (ref 0.7–4.0)
MCH: 28.6 pg (ref 26.0–34.0)
MCHC: 34.2 g/dL (ref 30.0–36.0)
MCV: 83.5 fL (ref 80.0–100.0)
Monocytes Absolute: 0.8 10*3/uL (ref 0.1–1.0)
Monocytes Relative: 6 %
Neutro Abs: 11.7 10*3/uL — ABNORMAL HIGH (ref 1.7–7.7)
Neutrophils Relative %: 88 %
Platelet Count: 234 10*3/uL (ref 150–400)
RBC: 4.48 MIL/uL (ref 4.22–5.81)
RDW: 15.1 % (ref 11.5–15.5)
WBC Count: 13.2 10*3/uL — ABNORMAL HIGH (ref 4.0–10.5)
nRBC: 0 % (ref 0.0–0.2)

## 2022-06-10 LAB — RAD ONC ARIA SESSION SUMMARY
Course Elapsed Days: 7
Plan Fractions Treated to Date: 6
Plan Prescribed Dose Per Fraction: 2 Gy
Plan Total Fractions Prescribed: 35
Plan Total Prescribed Dose: 70 Gy
Reference Point Dosage Given to Date: 12 Gy
Reference Point Session Dosage Given: 2 Gy
Session Number: 6

## 2022-06-10 LAB — CMP (CANCER CENTER ONLY)
ALT: 171 U/L — ABNORMAL HIGH (ref 0–44)
AST: 45 U/L — ABNORMAL HIGH (ref 15–41)
Albumin: 3.6 g/dL (ref 3.5–5.0)
Alkaline Phosphatase: 72 U/L (ref 38–126)
Anion gap: 7 (ref 5–15)
BUN: 29 mg/dL — ABNORMAL HIGH (ref 6–20)
CO2: 26 mmol/L (ref 22–32)
Calcium: 8.5 mg/dL — ABNORMAL LOW (ref 8.9–10.3)
Chloride: 106 mmol/L (ref 98–111)
Creatinine: 1.06 mg/dL (ref 0.61–1.24)
GFR, Estimated: >60 mL/min (ref >60)
Glucose, Bld: 160 mg/dL — ABNORMAL HIGH (ref 70–99)
Potassium: 3.8 mmol/L (ref 3.5–5.1)
Sodium: 139 mmol/L (ref 135–145)
Total Bilirubin: 0.4 mg/dL (ref 0.3–1.2)
Total Protein: 6.4 g/dL — ABNORMAL LOW (ref 6.5–8.1)

## 2022-06-10 LAB — MAGNESIUM: Magnesium: 1.6 mg/dL — ABNORMAL LOW (ref 1.7–2.4)

## 2022-06-10 MED ORDER — SODIUM CHLORIDE 0.9% FLUSH
10.0000 mL | Freq: Once | INTRAVENOUS | Status: AC
  Administered 2022-06-10: 10 mL

## 2022-06-10 MED ORDER — HEPARIN SOD (PORK) LOCK FLUSH 100 UNIT/ML IV SOLN
500.0000 [IU] | Freq: Once | INTRAVENOUS | Status: AC
  Administered 2022-06-10: 500 [IU]

## 2022-06-10 NOTE — Progress Notes (Signed)
[place] NOTE  Patient Care Team: Janie Morning, DO as PCP - General (Family Medicine) Stanford Breed, Keenan Bachelor, MD as Referring Physician (Otolaryngology) Eppie Gibson, MD as Consulting Physician (Radiation Oncology) Benay Pike, MD as Consulting Physician (Hematology and Oncology) Malmfelt, Stephani Police, RN as Oncology Nurse Navigator  CHIEF COMPLAINTS/PURPOSE OF CONSULTATION:  SCC tonsil.  ASSESSMENT & PLAN:   This is a very pleasant 57 yr old male patient with HTN, Prostate cancer referred to ENT oncology for new diagnosis of SCC tonsil, P16. He first noticed some sore throat, difficulty swallowing and some slurred speech and was investigated for the same. PE demonstrated right tonsillar mass on exam, some slurred speech, no definitive palpable LN. He is now s.p 2 cycles of chemotherapy.  Since his last visit here, he has noticed improvement in the right neck swelling.  He states the pain is manageable.  He tried viscous lidocaine but could not tolerate this well.  He denies any adverse effects from chemotherapy.  He will continue chemotherapy as scheduled and return to clinic in 1 week.  On physical examination, improving cervical adenopathy, mild erythema, G-tube and port site otherwise appears well.  Benay Pike MD    HISTORY OF PRESENTING ILLNESS:  Aaron Moore 57 y.o. male is here because of SCC Tonsil  This is a very pleasant 57 yr old male patient with PMH significant for prostate cancer, amputation of toe, HTN , obesity referred to Head and neck oncology because of his new diagnosis of SCC tonsil. Many months ago, patient noticed some hoarseness and sore throat. He was referred to ENT doctor. He had a biopsy while he was in office. Biopsy showed small fragments of squamous mucosa concerning for SCC in situ. He then had PET CT imaging which showed right avid tonsillar lesion and right nodal metastasis. He was reviewed in our H and N tumor board  and the size of the tonsillar lesion is estimated to be above 4 cms. Given size of tumor greater than 4 cms, and positive LN, we have discussed that its best to proceed with concurrent chemoradiation.   He started first cycle of chemotherapy 06/05/2022 Since his last visit here, he has been eating well, gained 5 lbs of weight.  He doesn't want to take any thing for pain yet. He says neck mass has gotten smaller. Yesterday he was a bit restless, didn't sleep well. He otherwise has been trying to hydrate well. He feels motivated to continue treatment as recommended. Rest of the pertinent 10 point ROS reviewed and negative.  MEDICAL HISTORY:  Past Medical History:  Diagnosis Date   Abdominal pain    Cancer ([place])    prostate cancer   Complication of anesthesia    slow to wake up 3 years ago after foot surgery   ED (erectile dysfunction)    Gout    Gout    History of kidney stones    Hypertension    Obesity, Class II, BMI 35-39.9, with comorbidity    Painful orthopaedic hardware ([place])    left foot    SURGICAL HISTORY: Past Surgical History:  Procedure Laterality Date   amputation of 2 toes on left foot      CARPAL TUNNEL RELEASE  05/03/2011   Procedure: CARPAL TUNNEL RELEASE;  Surgeon: Wynonia Sours, MD;  Location: [place];  Service: Orthopedics;  Laterality: Left;   FOOT FRACTURE SURGERY Left    x3   HARDWARE REMOVAL Left 06/03/2019  Procedure: HARDWARE REMOVAL;  Surgeon: Erle Crocker, MD;  Location: [place];  Service: Orthopedics;  Laterality: Left;  PROCEDURE: LEFT FOOT REMOVAL OF HARDWARE, IRRIGATION AND DEBRIDEMENT,PLACEMENT OF CEMENT SPACER, DEBULKIN, SOFT TISSUE MASS LENGTH OF SURGERY: 2 HOURS   HERNIA REPAIR  02/06/2009   Lap supraumb & umb VWH repairs   INCISION AND DRAINAGE OF WOUND Left 06/03/2019   Procedure: IRRIGATION AND DEBRIDEMENT WOUND WITH IMPLANTATION OF ANTIBIOTIC CEMENT SPACER;  Surgeon: Erle Crocker, MD;   Location: [place];  Service: Orthopedics;  Laterality: Left;   IR GASTROSTOMY TUBE MOD SED  05/29/2022   IR IMAGING GUIDED PORT INSERTION  05/29/2022   left rotator cuff surgery      LYMPHADENECTOMY Bilateral 02/07/2022   Procedure: BILATERAL PELVIC LYMPHADENECTOMY;  Surgeon: Raynelle Bring, MD;  Location: [facility];  Service: Urology;  Laterality: Bilateral;   MASS EXCISION Left 06/03/2019   Procedure: DEBULKING LEFT FOOT MASS;  Surgeon: Erle Crocker, MD;  Location: [place];  Service: Orthopedics;  Laterality: Left;   ROBOT ASSISTED LAPAROSCOPIC RADICAL PROSTATECTOMY N/A 02/07/2022   Procedure: XI ROBOTIC ASSISTED LAPAROSCOPIC RADICAL PROSTATECTOMY LEVEL 3;  Surgeon: Raynelle Bring, MD;  Location: [facility];  Service: Urology;  Laterality: N/A;  210 MINUTES NEEDED FOR CASE    SOCIAL HISTORY: Social History   Socioeconomic History   Marital status: Married    Spouse name: Not on file   Number of children: Not on file   Years of education: Not on file   Highest education level: Not on file  Occupational History   Not on file  Tobacco Use   Smoking status: Never   Smokeless tobacco: Never  Vaping Use   Vaping Use: Never used  Substance and Sexual Activity   Alcohol use: No   Drug use: No   Sexual activity: Not on file  Other Topics Concern   Not on file  Social History Narrative   Not on file   Social Determinants of Health   Financial Resource Strain: Not on file  Food Insecurity: No Food Insecurity (02/07/2022)   Hunger Vital Sign    Worried About Running Out of Food in the Last Year: Never true    Ran Out of Food in the Last Year: Never true  Transportation Needs: No Transportation Needs (02/07/2022)   PRAPARE - Hydrologist (Medical): No    Lack of Transportation (Non-Medical): No  Physical Activity: Not on file  Stress: Not on file  Social Connections: Not on file  Intimate Partner Violence: Not At Risk  (02/07/2022)   Humiliation, Afraid, Rape, and Kick questionnaire    Fear of Current or Ex-Partner: No    Emotionally Abused: No    Physically Abused: No    Sexually Abused: No    FAMILY HISTORY: Family History  Problem Relation Age of Onset   Diabetes Mother    Diabetes Father     ALLERGIES:  has No Known Allergies.  MEDICATIONS:  Current Outpatient Medications  Medication Sig Dispense Refill   amLODipine-valsartan (EXFORGE) 10-320 MG tablet Take 1 tablet by mouth daily.     dexamethasone (DECADRON) 4 MG tablet Take 2 tablets daily x 3 days starting the day after cisplatin chemotherapy. Take with food. 30 tablet 1   docusate sodium (COLACE) 100 MG capsule Take 1 capsule (100 mg total) by mouth 2 (two) times daily.     lidocaine (XYLOCAINE) 2 % solution Patient: Mix 1part  2% viscous lidocaine, 1part H20. Swish & swallow 72m of diluted mixture, 350m before meals and at bedtime, up to QID 200 mL 3   lidocaine-prilocaine (EMLA) cream Apply to affected area once 30 g 3   ondansetron (ZOFRAN) 8 MG tablet Take 1 tablet (8 mg total) by mouth every 8 (eight) hours as needed for nausea or vomiting. Start on the third day after cisplatin. 30 tablet 1   prochlorperazine (COMPAZINE) 10 MG tablet Take 1 tablet (10 mg total) by mouth every 6 (six) hours as needed (Nausea or vomiting). 30 tablet 1   Semaglutide-Weight Management (WEGOVY) 1.7 MG/0.75ML SOAJ Inject 1.7 mg into the skin once a week. 3 mL 1   Semaglutide-Weight Management (WEGOVY) 2.4 MG/0.75ML SOAJ 0.75 mL Subcutaneous weekly 30 day(s) 3 mL 5   sulfamethoxazole-trimethoprim (BACTRIM DS) 800-160 MG tablet Take 1 tablet by mouth 2 (two) times daily. Start the day prior to foley removal appointment 6 tablet 0   traMADol (ULTRAM) 50 MG tablet Take 1-2 tablets (50-100 mg total) by mouth every 6 (six) hours as needed for moderate pain or severe pain. 20 tablet 0   No current facility-administered medications for this visit.    PHYSICAL  EXAMINATION: ECOG PERFORMANCE STATUS: 0 - Asymptomatic  Vitals:   06/10/22 0922  BP: 131/80  Pulse: 88  Resp: 16  Temp: 98.1 F (36.7 C)  SpO2: 98%   Filed Weights   06/10/22 0922  Weight: 277 lb 9.6 oz (125.9 kg)    GENERAL:alert, no distress and comfortable HEENT, mild mucositis, next chest port site and G-tube site appears well No palpable adenopathy in the cervical region. No lower extremity edema  LABORATORY DATA:  I have reviewed the data as listed Lab Results  Component Value Date   WBC 13.2 (H) 06/10/2022   HGB 12.8 (L) 06/10/2022   HCT 37.4 (L) 06/10/2022   MCV 83.5 06/10/2022   PLT 234 06/10/2022     Chemistry      Component Value Date/Time   NA 140 06/04/2022 0816   K 3.9 06/04/2022 0816   CL 105 06/04/2022 0816   CO2 30 06/04/2022 0816   BUN 15 06/04/2022 0816   CREATININE 1.13 06/04/2022 0816      Component Value Date/Time   CALCIUM 9.0 06/04/2022 0816   ALKPHOS 103 05/08/2022 1452   AST 22 05/08/2022 1452   ALT 71 (H) 05/08/2022 1452   BILITOT 0.6 05/08/2022 1452       RADIOGRAPHIC STUDIES: I have personally reviewed the radiological images as listed and agreed with the findings in the report. IR Gastrostomy Tube  Result Date: 05/29/2022 CLINICAL DATA:  Left tonsillar squamous carcinoma and need for gastrostomy tube for nutrition during treatment. EXAM: PERCUTANEOUS GASTROSTOMY TUBE PLACEMENT ANESTHESIA/SEDATION: Moderate (conscious) sedation was employed during this procedure. A total of Versed 1.0 mg and Fentanyl 100 mcg was administered intravenously by radiology nursing. Moderate Sedation Time: 15 minutes. The patient's level of consciousness and vital signs were monitored continuously by radiology nursing throughout the procedure under my direct supervision. CONTRAST:  1579mMNIPAQUE IOHEXOL 300 MG/ML  SOLN MEDICATIONS: 3 g IV Ancef. IV antibiotic was administered in an appropriate time interval prior to needle puncture of the skin.  FLUOROSCOPY TIME:  2 minutes and 48 seconds. 63 mGy. PROCEDURE: The procedure, risks, benefits, and alternatives were explained to the patient. Questions regarding the procedure were encouraged and answered. The patient understands and consents to the procedure. A time-out was performed prior to initiating the  procedure. A 5-French catheter was then advanced through the patient's mouth under fluoroscopy into the esophagus and to the level of the stomach. This catheter was used to insufflate the stomach with air under fluoroscopy. The abdominal wall was prepped with chlorhexidine in a sterile fashion, and a sterile drape was applied covering the operative field. A sterile gown and sterile gloves were used for the procedure. Local anesthesia was provided with 1% Lidocaine. A skin incision was made in the upper abdominal wall. Under fluoroscopy, an 18 gauge trocar needle was advanced into the stomach. Contrast injection was performed to confirm intraluminal position of the needle tip. A single T tack was then deployed in the lumen of the stomach. This was brought up to tension at the skin surface. Over a guidewire, a 9-French sheath was advanced into the lumen of the stomach. The wire was left in place as a safety wire. A loop snare device from a percutaneous gastrostomy kit was then advanced into the stomach. A floppy guide wire was advanced through the orogastric catheter under fluoroscopy in the stomach. The loop snare advanced through the percutaneous gastric access was used to snare the guide wire. This allowed withdrawal of the loop snare out of the patient's mouth by retraction of the orogastric catheter and wire. A 20-French bumper retention gastrostomy tube was looped around the snare device. It was then pulled back through the patient's mouth. The retention bumper was brought up to the anterior gastric wall. The T tack suture was cut at the skin. The exiting gastrostomy tube was cut to appropriate length and  a feeding adapter applied. The catheter was injected with contrast material to confirm position and a fluoroscopic spot image saved. The tube was then flushed with saline. A dressing was applied over the gastrostomy exit site. COMPLICATIONS: None. FINDINGS: The stomach distended well with air allowing safe placement of the gastrostomy tube. After placement, the tip of the gastrostomy tube lies in the body of the stomach. IMPRESSION: Percutaneous gastrostomy with placement of a 20-French bumper retention tube in the body of the stomach. This tube can be used for percutaneous feeds beginning in 24 hours after placement. Electronically Signed   By: Aletta Edouard M.D.   On: 05/29/2022 14:18   IR IMAGING GUIDED PORT INSERTION  Result Date: 05/29/2022 CLINICAL DATA:  Left tonsillar squamous carcinoma and need for porta cath to begin chemotherapy. EXAM: IMPLANTED PORT A CATH PLACEMENT WITH ULTRASOUND AND FLUOROSCOPIC GUIDANCE ANESTHESIA/SEDATION: Moderate (conscious) sedation was employed during this procedure. A total of Versed 3.0 mg and Fentanyl 100 mcg was administered intravenously. Moderate Sedation Time: 30 minutes. The patient's level of consciousness and vital signs were monitored continuously by radiology nursing throughout the procedure under my direct supervision. FLUOROSCOPY: 30 seconds.  6.0 mGy. PROCEDURE: The procedure, risks, benefits, and alternatives were explained to the patient. Questions regarding the procedure were encouraged and answered. The patient understands and consents to the procedure. A time-out was performed prior to initiating the procedure. Ultrasound was utilized to confirm patency of the right internal jugular vein. A permanent ultrasound image was saved and recorded. The right neck and chest were prepped with chlorhexidine in a sterile fashion, and a sterile drape was applied covering the operative field. Maximum barrier sterile technique with sterile gowns and gloves were used  for the procedure. Local anesthesia was provided with 1% lidocaine. After creating a small venotomy incision, a 21 gauge needle was advanced into the right internal jugular vein under direct, real-time ultrasound  guidance. Ultrasound image documentation was performed. After securing guidewire access, an 8 Fr dilator was placed. A J-wire was kinked to measure appropriate catheter length. A subcutaneous port pocket was then created along the upper chest wall utilizing sharp and blunt dissection. Portable cautery was utilized. The pocket was irrigated with sterile saline. A single lumen power injectable port was chosen for placement. The 8 Fr catheter was tunneled from the port pocket site to the venotomy incision. The port was placed in the pocket. External catheter was trimmed to appropriate length based on guidewire measurement. At the venotomy, an 8 Fr peel-away sheath was placed over a guidewire. The catheter was then placed through the sheath and the sheath removed. Final catheter positioning was confirmed and documented with a fluoroscopic spot image. The port was accessed with a needle and aspirated and flushed with heparinized saline. The access needle was removed. The venotomy and port pocket incisions were closed with subcutaneous 3-0 Monocryl and subcuticular 4-0 Vicryl. Dermabond was applied to both incisions. COMPLICATIONS: COMPLICATIONS None FINDINGS: After catheter placement, the tip lies at the cavo-atrial junction. The catheter aspirates normally and is ready for immediate use. IMPRESSION: Placement of single lumen port a cath via right internal jugular vein. The catheter tip lies at the cavo-atrial junction. A power injectable port a cath was placed and is ready for immediate use. Electronically Signed   By: Aletta Edouard M.D.   On: 05/29/2022 13:19    All questions were answered. The patient knows to call the clinic with any problems, questions or concerns. I spent 20 minutes in the care of  this patient including H and P, review of records, counseling and coordination of care.     Benay Pike, MD 06/10/2022 9:37 AM

## 2022-06-11 ENCOUNTER — Other Ambulatory Visit: Payer: Self-pay

## 2022-06-11 ENCOUNTER — Ambulatory Visit
Admission: RE | Admit: 2022-06-11 | Discharge: 2022-06-11 | Disposition: A | Discharge status: Home / Self Care | Payer: 59 | Source: Ambulatory Visit | Attending: Radiation Oncology | Admitting: Radiation Oncology

## 2022-06-11 DIAGNOSIS — Z510 Encounter for antineoplastic radiation therapy: Secondary | ICD-10-CM | POA: Diagnosis not present

## 2022-06-11 LAB — RAD ONC ARIA SESSION SUMMARY
Course Elapsed Days: 8
Plan Fractions Treated to Date: 7
Plan Prescribed Dose Per Fraction: 2 Gy
Plan Total Fractions Prescribed: 35
Plan Total Prescribed Dose: 70 Gy
Reference Point Dosage Given to Date: 14 Gy
Reference Point Session Dosage Given: 2 Gy
Session Number: 7

## 2022-06-12 ENCOUNTER — Inpatient Hospital Stay: Payer: 59

## 2022-06-12 ENCOUNTER — Ambulatory Visit
Admission: RE | Admit: 2022-06-12 | Discharge: 2022-06-12 | Disposition: A | Discharge status: Home / Self Care | Payer: 59 | Source: Ambulatory Visit | Attending: Radiation Oncology | Admitting: Radiation Oncology

## 2022-06-12 ENCOUNTER — Encounter: Payer: Self-pay | Admitting: General Practice

## 2022-06-12 ENCOUNTER — Inpatient Hospital Stay: Payer: 59 | Admitting: Dietician

## 2022-06-12 ENCOUNTER — Other Ambulatory Visit: Payer: Self-pay

## 2022-06-12 VITALS — BP 127/75 | HR 79 | Temp 98.4°F | Resp 18 | Wt 282.0 lb

## 2022-06-12 DIAGNOSIS — Z510 Encounter for antineoplastic radiation therapy: Secondary | ICD-10-CM | POA: Diagnosis not present

## 2022-06-12 DIAGNOSIS — C090 Malignant neoplasm of tonsillar fossa: Secondary | ICD-10-CM

## 2022-06-12 LAB — RAD ONC ARIA SESSION SUMMARY
Course Elapsed Days: 9
Plan Fractions Treated to Date: 8
Plan Prescribed Dose Per Fraction: 2 Gy
Plan Total Fractions Prescribed: 35
Plan Total Prescribed Dose: 70 Gy
Reference Point Dosage Given to Date: 16 Gy
Reference Point Session Dosage Given: 2 Gy
Session Number: 8

## 2022-06-12 MED ORDER — SODIUM CHLORIDE 0.9 % IV SOLN
10.0000 mg | Freq: Once | INTRAVENOUS | Status: AC
  Administered 2022-06-12: 10 mg via INTRAVENOUS
  Filled 2022-06-12: qty 10

## 2022-06-12 MED ORDER — MAGNESIUM SULFATE 2 GM/50ML IV SOLN
2.0000 g | Freq: Once | INTRAVENOUS | Status: AC
  Administered 2022-06-12: 2 g via INTRAVENOUS
  Filled 2022-06-12: qty 50

## 2022-06-12 MED ORDER — SODIUM CHLORIDE 0.9% FLUSH
10.0000 mL | INTRAVENOUS | Status: DC | PRN
  Administered 2022-06-12: 10 mL

## 2022-06-12 MED ORDER — PALONOSETRON HCL INJECTION 0.25 MG/5ML
0.2500 mg | Freq: Once | INTRAVENOUS | Status: AC
  Administered 2022-06-12: 0.25 mg via INTRAVENOUS
  Filled 2022-06-12: qty 5

## 2022-06-12 MED ORDER — SODIUM CHLORIDE 0.9 % IV SOLN
150.0000 mg | Freq: Once | INTRAVENOUS | Status: AC
  Administered 2022-06-12: 150 mg via INTRAVENOUS
  Filled 2022-06-12: qty 150

## 2022-06-12 MED ORDER — SODIUM CHLORIDE 0.9 % IV SOLN: Freq: Once | INTRAVENOUS | Status: AC

## 2022-06-12 MED ORDER — HEPARIN SOD (PORK) LOCK FLUSH 100 UNIT/ML IV SOLN
500.0000 [IU] | Freq: Once | INTRAVENOUS | Status: AC | PRN
  Administered 2022-06-12: 500 [IU]

## 2022-06-12 MED ORDER — SODIUM CHLORIDE 0.9 % IV SOLN
40.0000 mg/m2 | Freq: Once | INTRAVENOUS | Status: AC
  Administered 2022-06-12: 100 mg via INTRAVENOUS
  Filled 2022-06-12: qty 100

## 2022-06-12 MED ORDER — POTASSIUM CHLORIDE IN NACL 20-0.9 MEQ/L-% IV SOLN
Freq: Once | INTRAVENOUS | Status: AC
  Filled 2022-06-12: qty 1000

## 2022-06-12 NOTE — Progress Notes (Signed)
Nutrition Follow-up:  Patient with SCC of right tonsil, P16 positive. He is receiving concurrent chemoradiation.   Met with patient in infusion. He reports appetite has picked up, says he has been eating like a pig. Recalls big bowl of pintos, cornbread, greens for supper last night. Patient is drinking a couple of Ensure. He likes these. Patient has cut back on juices. He did not realize how much sugar was in them. Says he needs to be drinking more water. Currently drinking ~2 bottles daily. Patient endorses drinking a couple glasses of bourbon a few nights ago. Unclear how much. Patient denies nausea, vomiting, diarrhea, constipation. He reports fatigue the day after chemotherapy. He is flushing tube daily with water.     Medications: reviewed   Labs: 3/11 - glucose 160, BUN 29  Anthropometrics: Weight 282 lb today increased   3/5 - 271 lb  2/28 - 272 lb 0.8 oz  NUTRITION DIAGNOSIS: Unintended weight loss stable     INTERVENTION:  Continue high calorie high protein foods orally at this time Continue 2 Ensure Plus HP  Reinforced importance of adequate hydration, encouraged 80-100 ounces, suggested increased water flushes to aid with meeting hydration needs  Educated on empty calories in alcohol, encouraged pt to abstain from alcohol during treatment     MONITORING, EVALUATION, GOAL: weight trends, intake, TF   NEXT VISIT: Wednesday March 20 during infusion

## 2022-06-12 NOTE — Progress Notes (Signed)
[facility] Spiritual Care Note  Followed up briefly in infusion, but Mr Kerstetter was very sleepy. We plan to follow up at a future treatment.   [place], [place], [facility] Pager [phone] Voicemail [phone]

## 2022-06-12 NOTE — Patient Instructions (Signed)
[facility]  Discharge Instructions: Thank you for choosing [facility] to provide your oncology and hematology care.   If you have a lab appointment with the Cancer Center, please go directly to the Cancer Center and check in at the registration area.   Wear comfortable clothing and clothing appropriate for easy access to any Portacath or PICC line.   We strive to give you quality time with your provider. You may need to reschedule your appointment if you arrive late (15 or more minutes).  Arriving late affects you and other patients whose appointments are after yours.  Also, if you miss three or more appointments without notifying the office, you may be dismissed from the clinic at the provider's discretion.      For prescription refill requests, have your pharmacy contact our office and allow 72 hours for refills to be completed.    Today you received the following chemotherapy and/or immunotherapy agents Cisplatin      To help prevent nausea and vomiting after your treatment, we encourage you to take your nausea medication as directed.  BELOW ARE SYMPTOMS THAT SHOULD BE REPORTED IMMEDIATELY: *FEVER GREATER THAN 100.4 F (38 C) OR HIGHER *CHILLS OR SWEATING *NAUSEA AND VOMITING THAT IS NOT CONTROLLED WITH YOUR NAUSEA MEDICATION *UNUSUAL SHORTNESS OF BREATH *UNUSUAL BRUISING OR BLEEDING *URINARY PROBLEMS (pain or burning when urinating, or frequent urination) *BOWEL PROBLEMS (unusual diarrhea, constipation, pain near the anus) TENDERNESS IN MOUTH AND THROAT WITH OR WITHOUT PRESENCE OF ULCERS (sore throat, sores in mouth, or a toothache) UNUSUAL RASH, SWELLING OR PAIN  UNUSUAL VAGINAL DISCHARGE OR ITCHING   Items with * indicate a potential emergency and should be followed up as soon as possible or go to the Emergency Department if any problems should occur.  Please show the CHEMOTHERAPY ALERT CARD or IMMUNOTHERAPY ALERT CARD at  check-in to the Emergency Department and triage nurse.  Should you have questions after your visit or need to cancel or reschedule your appointment, please contact [facility]  Dept: [phone]  and follow the prompts.  Office hours are 8:00 a.m. to 4:30 p.m. Monday - Friday. Please note that voicemails left after 4:00 p.m. may not be returned until the following business day.  We are closed weekends and major holidays. You have access to a nurse at all times for urgent questions. Please call the main number to the clinic Dept: [phone] and follow the prompts.   For any non-urgent questions, you may also contact your provider using MyChart. We now offer e-Visits for anyone 18 and older to request care online for non-urgent symptoms. For details visit [email or web address].   Also download the MyChart app! Go to the app store, search "MyChart", open the app, select , and log in with your MyChart username and password.   

## 2022-06-13 ENCOUNTER — Ambulatory Visit
Admission: RE | Admit: 2022-06-13 | Discharge: 2022-06-13 | Disposition: A | Discharge status: Home / Self Care | Payer: 59 | Source: Ambulatory Visit | Attending: Radiation Oncology | Admitting: Radiation Oncology

## 2022-06-13 ENCOUNTER — Other Ambulatory Visit: Payer: Self-pay

## 2022-06-13 DIAGNOSIS — Z510 Encounter for antineoplastic radiation therapy: Secondary | ICD-10-CM | POA: Diagnosis not present

## 2022-06-13 LAB — RAD ONC ARIA SESSION SUMMARY
Course Elapsed Days: 10
Plan Fractions Treated to Date: 9
Plan Prescribed Dose Per Fraction: 2 Gy
Plan Total Fractions Prescribed: 35
Plan Total Prescribed Dose: 70 Gy
Reference Point Dosage Given to Date: 18 Gy
Reference Point Session Dosage Given: 2 Gy
Session Number: 9

## 2022-06-14 ENCOUNTER — Other Ambulatory Visit: Payer: Self-pay

## 2022-06-14 ENCOUNTER — Ambulatory Visit
Admission: RE | Admit: 2022-06-14 | Discharge: 2022-06-14 | Disposition: A | Discharge status: Home / Self Care | Payer: 59 | Source: Ambulatory Visit | Attending: Radiation Oncology | Admitting: Radiation Oncology

## 2022-06-14 DIAGNOSIS — Z510 Encounter for antineoplastic radiation therapy: Secondary | ICD-10-CM | POA: Diagnosis not present

## 2022-06-14 LAB — RAD ONC ARIA SESSION SUMMARY
Course Elapsed Days: 11
Plan Fractions Treated to Date: 10
Plan Prescribed Dose Per Fraction: 2 Gy
Plan Total Fractions Prescribed: 35
Plan Total Prescribed Dose: 70 Gy
Reference Point Dosage Given to Date: 20 Gy
Reference Point Session Dosage Given: 2 Gy
Session Number: 10

## 2022-06-17 ENCOUNTER — Other Ambulatory Visit: Payer: Self-pay

## 2022-06-17 ENCOUNTER — Ambulatory Visit: Admission: RE | Admit: 2022-06-17 | Payer: 59 | Source: Ambulatory Visit

## 2022-06-18 ENCOUNTER — Ambulatory Visit
Admission: RE | Admit: 2022-06-18 | Discharge: 2022-06-18 | Disposition: A | Discharge status: Home / Self Care | Payer: 59 | Source: Ambulatory Visit | Attending: Radiation Oncology | Admitting: Radiation Oncology

## 2022-06-18 ENCOUNTER — Other Ambulatory Visit: Payer: Self-pay

## 2022-06-18 ENCOUNTER — Other Ambulatory Visit: Payer: Self-pay | Admitting: Radiation Oncology

## 2022-06-18 DIAGNOSIS — C090 Malignant neoplasm of tonsillar fossa: Secondary | ICD-10-CM

## 2022-06-18 DIAGNOSIS — Z510 Encounter for antineoplastic radiation therapy: Secondary | ICD-10-CM | POA: Diagnosis not present

## 2022-06-18 LAB — RAD ONC ARIA SESSION SUMMARY
Course Elapsed Days: 15
Plan Fractions Treated to Date: 11
Plan Prescribed Dose Per Fraction: 2 Gy
Plan Total Fractions Prescribed: 35
Plan Total Prescribed Dose: 70 Gy
Reference Point Dosage Given to Date: 22 Gy
Reference Point Session Dosage Given: 2 Gy
Session Number: 11

## 2022-06-18 MED ORDER — SUCRALFATE 1 G PO TABS: ORAL_TABLET | ORAL | 3 refills | Status: DC

## 2022-06-18 MED FILL — Dexamethasone Sodium Phosphate Inj 100 MG/10ML: INTRAMUSCULAR | Qty: 1 | Status: AC

## 2022-06-18 MED FILL — Fosaprepitant Dimeglumine For IV Infusion 150 MG (Base Eq): INTRAVENOUS | Qty: 5 | Status: AC

## 2022-06-19 ENCOUNTER — Inpatient Hospital Stay: Payer: 59

## 2022-06-19 ENCOUNTER — Inpatient Hospital Stay (HOSPITAL_BASED_OUTPATIENT_CLINIC_OR_DEPARTMENT_OTHER): Payer: 59 | Admitting: Adult Health

## 2022-06-19 ENCOUNTER — Ambulatory Visit
Admission: RE | Admit: 2022-06-19 | Discharge: 2022-06-19 | Disposition: A | Discharge status: Home / Self Care | Payer: 59 | Source: Ambulatory Visit | Attending: Radiation Oncology | Admitting: Radiation Oncology

## 2022-06-19 ENCOUNTER — Inpatient Hospital Stay: Payer: 59 | Admitting: Dietician

## 2022-06-19 ENCOUNTER — Other Ambulatory Visit: Payer: Self-pay

## 2022-06-19 ENCOUNTER — Encounter: Payer: Self-pay | Admitting: General Practice

## 2022-06-19 ENCOUNTER — Encounter: Payer: Self-pay | Admitting: Adult Health

## 2022-06-19 VITALS — BP 134/88 | HR 79 | Resp 18

## 2022-06-19 DIAGNOSIS — Z510 Encounter for antineoplastic radiation therapy: Secondary | ICD-10-CM | POA: Diagnosis not present

## 2022-06-19 DIAGNOSIS — C090 Malignant neoplasm of tonsillar fossa: Secondary | ICD-10-CM

## 2022-06-19 DIAGNOSIS — Z95828 Presence of other vascular implants and grafts: Secondary | ICD-10-CM

## 2022-06-19 LAB — BASIC METABOLIC PANEL - CANCER CENTER ONLY
Anion gap: 8 (ref 5–15)
BUN: 29 mg/dL — ABNORMAL HIGH (ref 6–20)
CO2: 26 mmol/L (ref 22–32)
Calcium: 8.5 mg/dL — ABNORMAL LOW (ref 8.9–10.3)
Chloride: 104 mmol/L (ref 98–111)
Creatinine: 1.12 mg/dL (ref 0.61–1.24)
GFR, Estimated: >60 mL/min (ref >60)
Glucose, Bld: 157 mg/dL — ABNORMAL HIGH (ref 70–99)
Potassium: 3.5 mmol/L (ref 3.5–5.1)
Sodium: 138 mmol/L (ref 135–145)

## 2022-06-19 LAB — CBC WITH DIFFERENTIAL (CANCER CENTER ONLY)
Abs Immature Granulocytes: 0.03 10*3/uL (ref 0.00–0.07)
Basophils Absolute: 0 10*3/uL (ref 0.0–0.1)
Basophils Relative: 0 %
Eosinophils Absolute: 0.1 10*3/uL (ref 0.0–0.5)
Eosinophils Relative: 1 %
HCT: 34.9 % — ABNORMAL LOW (ref 39.0–52.0)
Hemoglobin: 11.7 g/dL — ABNORMAL LOW (ref 13.0–17.0)
Immature Granulocytes: 0 %
Lymphocytes Relative: 4 %
Lymphs Abs: 0.4 10*3/uL — ABNORMAL LOW (ref 0.7–4.0)
MCH: 28.7 pg (ref 26.0–34.0)
MCHC: 33.5 g/dL (ref 30.0–36.0)
MCV: 85.5 fL (ref 80.0–100.0)
Monocytes Absolute: 0.8 10*3/uL (ref 0.1–1.0)
Monocytes Relative: 8 %
Neutro Abs: 8.2 10*3/uL — ABNORMAL HIGH (ref 1.7–7.7)
Neutrophils Relative %: 87 %
Platelet Count: 150 10*3/uL (ref 150–400)
RBC: 4.08 MIL/uL — ABNORMAL LOW (ref 4.22–5.81)
RDW: 16 % — ABNORMAL HIGH (ref 11.5–15.5)
WBC Count: 9.4 10*3/uL (ref 4.0–10.5)
nRBC: 0 % (ref 0.0–0.2)

## 2022-06-19 LAB — RAD ONC ARIA SESSION SUMMARY
Course Elapsed Days: 16
Plan Fractions Treated to Date: 12
Plan Prescribed Dose Per Fraction: 2 Gy
Plan Total Fractions Prescribed: 35
Plan Total Prescribed Dose: 70 Gy
Reference Point Dosage Given to Date: 24 Gy
Reference Point Session Dosage Given: 2 Gy
Session Number: 12

## 2022-06-19 LAB — MAGNESIUM: Magnesium: 1.8 mg/dL (ref 1.7–2.4)

## 2022-06-19 MED ORDER — SODIUM CHLORIDE 0.9 % IV SOLN
150.0000 mg | Freq: Once | INTRAVENOUS | Status: AC
  Administered 2022-06-19: 150 mg via INTRAVENOUS
  Filled 2022-06-19: qty 150

## 2022-06-19 MED ORDER — SODIUM CHLORIDE 0.9% FLUSH
10.0000 mL | Freq: Once | INTRAVENOUS | Status: AC
  Administered 2022-06-19: 10 mL

## 2022-06-19 MED ORDER — PALONOSETRON HCL INJECTION 0.25 MG/5ML
0.2500 mg | Freq: Once | INTRAVENOUS | Status: AC
  Administered 2022-06-19: 0.25 mg via INTRAVENOUS
  Filled 2022-06-19: qty 5

## 2022-06-19 MED ORDER — MAGNESIUM SULFATE 2 GM/50ML IV SOLN
2.0000 g | Freq: Once | INTRAVENOUS | Status: AC
  Administered 2022-06-19: 2 g via INTRAVENOUS
  Filled 2022-06-19: qty 50

## 2022-06-19 MED ORDER — SODIUM CHLORIDE 0.9 % IV SOLN: Freq: Once | INTRAVENOUS | Status: AC

## 2022-06-19 MED ORDER — SODIUM CHLORIDE 0.9 % IV SOLN
40.0000 mg/m2 | Freq: Once | INTRAVENOUS | Status: AC
  Administered 2022-06-19: 100 mg via INTRAVENOUS
  Filled 2022-06-19: qty 100

## 2022-06-19 MED ORDER — SODIUM CHLORIDE 0.9% FLUSH
10.0000 mL | INTRAVENOUS | Status: DC | PRN
  Administered 2022-06-19: 10 mL

## 2022-06-19 MED ORDER — POTASSIUM CHLORIDE IN NACL 20-0.9 MEQ/L-% IV SOLN
Freq: Once | INTRAVENOUS | Status: AC
  Filled 2022-06-19: qty 1000

## 2022-06-19 MED ORDER — SODIUM CHLORIDE 0.9 % IV SOLN
10.0000 mg | Freq: Once | INTRAVENOUS | Status: AC
  Administered 2022-06-19: 10 mg via INTRAVENOUS
  Filled 2022-06-19: qty 10

## 2022-06-19 MED ORDER — HEPARIN SOD (PORK) LOCK FLUSH 100 UNIT/ML IV SOLN
500.0000 [IU] | Freq: Once | INTRAVENOUS | Status: AC | PRN
  Administered 2022-06-19: 500 [IU]

## 2022-06-19 NOTE — Progress Notes (Signed)
[facility] Follow up:    Aaron Moore, [name] [place] [address]   DIAGNOSIS:  Cancer Staging  Cancer of tonsillar fossa ([place]) Staging form: Pharynx - HPV-Mediated Oropharynx, AJCC 8th Edition - Clinical stage from 05/08/2022: Stage II (cT3, cN1, cM0, p16+) - Signed by Eppie Gibson, MD on 05/08/2022 Stage prefix: Initial diagnosis   SUMMARY OF ONCOLOGIC HISTORY: Oncology History  Cancer of tonsillar fossa ([place])  05/08/2022 Initial Diagnosis   Cancer of tonsillar fossa ([place])   05/08/2022 Cancer Staging   Staging form: Pharynx - HPV-Mediated Oropharynx, AJCC 8th Edition - Clinical stage from 05/08/2022: Stage II (cT3, cN1, cM0, p16+) - Signed by Eppie Gibson, MD on 05/08/2022 Stage prefix: Initial diagnosis   06/05/2022 -  Chemotherapy   Patient is on Treatment Plan : HEAD/NECK Cisplatin (40) q7d       CURRENT THERAPY: concurrent chemoradiation  INTERVAL HISTORY: Aaron Moore 57 y.o. male returns for follow-up of his head neck cancer.  He continues on concurrent chemoradiation with cisplatin.  He tells me that he does have some a dyne aphasia but otherwise is doing well.  He is drinking 3 bottles of water a day and is taking in oral food and liquids as recommended.  He flushes his tube twice a day without difficulty.  He denies any peripheral neuropathy and is tired but otherwise feeling well.   Patient Active Problem List   Diagnosis Date Noted   Port-A-Cath in place 06/04/2022   Cancer of tonsillar fossa ([place]) 05/08/2022   Prostate cancer ([place]) 02/07/2022   Medial epicondylitis of left elbow 03/08/2015   Obesity, Class II, BMI 35-39.9, with comorbidity 11/28/2010   Abdominal wall mass of epigastric region - old hernia sac/lipoma 11/28/2010   Abdominal pain, RLQ - muscle strain 11/28/2010    has No Known Allergies.  MEDICAL HISTORY: Past Medical History:  Diagnosis Date   Abdominal pain    Cancer ([place])    prostate cancer    Complication of anesthesia    slow to wake up 3 years ago after foot surgery   ED (erectile dysfunction)    Gout    Gout    History of kidney stones    Hypertension    Obesity, Class II, BMI 35-39.9, with comorbidity    Painful orthopaedic hardware ([place])    left foot    SURGICAL HISTORY: Past Surgical History:  Procedure Laterality Date   amputation of 2 toes on left foot      CARPAL TUNNEL RELEASE  05/03/2011   Procedure: CARPAL TUNNEL RELEASE;  Surgeon: Wynonia Sours, MD;  Location: [facility];  Service: Orthopedics;  Laterality: Left;   FOOT FRACTURE SURGERY Left    x3   HARDWARE REMOVAL Left 06/03/2019   Procedure: HARDWARE REMOVAL;  Surgeon: Erle Crocker, MD;  Location: [facility];  Service: Orthopedics;  Laterality: Left;  PROCEDURE: LEFT FOOT REMOVAL OF HARDWARE, IRRIGATION AND DEBRIDEMENT,PLACEMENT OF CEMENT SPACER, DEBULKIN, SOFT TISSUE MASS LENGTH OF SURGERY: 2 HOURS   HERNIA REPAIR  02/06/2009   Lap supraumb & umb VWH repairs   INCISION AND DRAINAGE OF WOUND Left 06/03/2019   Procedure: IRRIGATION AND DEBRIDEMENT WOUND WITH IMPLANTATION OF ANTIBIOTIC CEMENT SPACER;  Surgeon: Erle Crocker, MD;  Location: [facility];  Service: Orthopedics;  Laterality: Left;   IR GASTROSTOMY TUBE MOD SED  05/29/2022   IR IMAGING GUIDED PORT INSERTION  05/29/2022   left rotator cuff  surgery      LYMPHADENECTOMY Bilateral 02/07/2022   Procedure: BILATERAL PELVIC LYMPHADENECTOMY;  Surgeon: Raynelle Bring, MD;  Location: [facility];  Service: Urology;  Laterality: Bilateral;   MASS EXCISION Left 06/03/2019   Procedure: DEBULKING LEFT FOOT MASS;  Surgeon: Erle Crocker, MD;  Location: [facility];  Service: Orthopedics;  Laterality: Left;   ROBOT ASSISTED LAPAROSCOPIC RADICAL PROSTATECTOMY N/A 02/07/2022   Procedure: XI ROBOTIC ASSISTED LAPAROSCOPIC RADICAL PROSTATECTOMY LEVEL 3;  Surgeon: Raynelle Bring, MD;  Location: [facility];  Service: Urology;  Laterality: N/A;  210 MINUTES NEEDED FOR CASE    SOCIAL HISTORY: Social History   Socioeconomic History   Marital status: Married    Spouse name: Not on file   Number of children: Not on file   Years of education: Not on file   Highest education level: Not on file  Occupational History   Not on file  Tobacco Use   Smoking status: Never   Smokeless tobacco: Never  Vaping Use   Vaping Use: Never used  Substance and Sexual Activity   Alcohol use: No   Drug use: No   Sexual activity: Not on file  Other Topics Concern   Not on file  Social History Narrative   Not on file   Social Determinants of Health   Financial Resource Strain: Not on file  Food Insecurity: No Food Insecurity (02/07/2022)   Hunger Vital Sign    Worried About Running Out of Food in the Last Year: Never true    Ran Out of Food in the Last Year: Never true  Transportation Needs: No Transportation Needs (02/07/2022)   PRAPARE - Hydrologist (Medical): No    Lack of Transportation (Non-Medical): No  Physical Activity: Not on file  Stress: Not on file  Social Connections: Not on file  Intimate Partner Violence: Not At Risk (02/07/2022)   Humiliation, Afraid, Rape, and Kick questionnaire    Fear of Current or Ex-Partner: No    Emotionally Abused: No    Physically Abused: No    Sexually Abused: No    FAMILY HISTORY: Family History  Problem Relation Age of Onset   Diabetes Mother    Diabetes Father     Review of Systems  Constitutional:  Positive for fatigue. Negative for appetite change, chills, fever and unexpected weight change.  HENT:   Negative for hearing loss, lump/mass and trouble swallowing.   Eyes:  Negative for eye problems and icterus.  Respiratory:  Negative for chest tightness, cough and shortness of breath.   Cardiovascular:  Negative for chest pain, leg swelling and palpitations.  Gastrointestinal:  Negative for abdominal distention,  abdominal pain, constipation, diarrhea, nausea and vomiting.  Endocrine: Negative for hot flashes.  Genitourinary:  Negative for difficulty urinating.   Musculoskeletal:  Negative for arthralgias.  Skin:  Negative for itching and rash.  Neurological:  Negative for dizziness, extremity weakness, headaches and numbness.  Hematological:  Negative for adenopathy. Does not bruise/bleed easily.  Psychiatric/Behavioral:  Negative for depression. The patient is not nervous/anxious.       PHYSICAL EXAMINATION  ECOG PERFORMANCE STATUS: 1 - Symptomatic but completely ambulatory  Vitals:   06/19/22 0902  BP: 130/74  Pulse: 81  Resp: 16  Temp: 98.1 F (36.7 C)  SpO2: 98%    Physical Exam Constitutional:      General: He is not in acute distress.    Appearance: Normal appearance. He is not toxic-appearing.  HENT:     Head: Normocephalic and atraumatic.     Mouth/Throat:     Mouth: Mucous membranes are moist.     Pharynx: Oropharynx is clear.  Eyes:     General: No scleral icterus. Cardiovascular:     Rate and Rhythm: Normal rate and regular rhythm.     Pulses: Normal pulses.     Heart sounds: Normal heart sounds.  Pulmonary:     Effort: Pulmonary effort is normal.     Breath sounds: Normal breath sounds.  Abdominal:     General: Abdomen is flat. Bowel sounds are normal. There is no distension.     Palpations: Abdomen is soft.     Tenderness: There is no abdominal tenderness.  Musculoskeletal:        General: No swelling.     Cervical back: Neck supple.  Lymphadenopathy:     Cervical: No cervical adenopathy.  Skin:    General: Skin is warm and dry.     Findings: No rash.  Neurological:     General: No focal deficit present.     Mental Status: He is alert.  Psychiatric:        Mood and Affect: Mood normal.        Behavior: Behavior normal.     LABORATORY DATA:  CBC    Component Value Date/Time   WBC 9.4 06/19/2022 0815   WBC 9.8 05/29/2022 1045   RBC 4.08 (L)  06/19/2022 0815   HGB 11.7 (L) 06/19/2022 0815   HCT 34.9 (L) 06/19/2022 0815   PLT 150 06/19/2022 0815   MCV 85.5 06/19/2022 0815   MCH 28.7 06/19/2022 0815   MCHC 33.5 06/19/2022 0815   RDW 16.0 (H) 06/19/2022 0815   LYMPHSABS 0.4 (L) 06/19/2022 0815   MONOABS 0.8 06/19/2022 0815   EOSABS 0.1 06/19/2022 0815   BASOSABS 0.0 06/19/2022 0815    CMP     Component Value Date/Time   NA 138 06/19/2022 0815   K 3.5 06/19/2022 0815   CL 104 06/19/2022 0815   CO2 26 06/19/2022 0815   GLUCOSE 157 (H) 06/19/2022 0815   BUN 29 (H) 06/19/2022 0815   CREATININE 1.12 06/19/2022 0815   CALCIUM 8.5 (L) 06/19/2022 0815   PROT 6.4 (L) 06/10/2022 0853   ALBUMIN 3.6 06/10/2022 0853   AST 45 (H) 06/10/2022 0853   ALT 171 (H) 06/10/2022 0853   ALKPHOS 72 06/10/2022 0853   BILITOT 0.4 06/10/2022 0853   GFRNONAA >60 06/19/2022 0815   GFRAA >60 10/21/2017 2005      ASSESSMENT and THERAPY PLAN:   Cancer of tonsillar fossa ([place]) Merry Proud is a 57 year old man with 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  He is currently undergoing concurrent chemoradiation and is tolerating this is well as expected.    He continues to lose weight and is following with nutrition.  We discussed oral intake.  I reviewed his labs with him today and we also discussed energy conservation for fatigue.  We will continue to monitor him closely while under treatment.  He will proceed with therapy today.  Will see him back in about a week for labs, follow-up, and his next infusion.   All questions were answered. The patient knows to call the clinic with any problems, questions or concerns. We can certainly see the patient much sooner if necessary.  Total encounter time:20 minutes*in face-to-face visit time, chart review, lab review, care coordination, order entry, and documentation of the  encounter time.  Wilber Bihari, NP 06/21/22 9:12 AM Medical Oncology and Hematology [facility] [place], [place] [address] Tel. [phone]    Fax. [phone]  *Total Encounter Time as defined by the Centers for Medicare and Medicaid Services includes, in addition to the face-to-face time of a patient visit (documented in the note above) non-face-to-face time: obtaining and reviewing outside history, ordering and reviewing medications, tests or procedures, care coordination (communications with other health care professionals or caregivers) and documentation in the medical record.

## 2022-06-19 NOTE — Progress Notes (Signed)
[place] Spiritual Care Note  Followed up with Aaron Moore in infusion, providing pastoral presence and reflective listening as he shared about significant relationships that give meaning to his life, including those with his wife and his uncle.  Plan to follow up at a future treatment.   [place], [place], [facility] Pager [phone] Voicemail [phone]

## 2022-06-19 NOTE — Progress Notes (Signed)
Nutrition Follow-up:  Patient with SCC of right tonsil, P16 positive. He is receiving concurrent chemoradiation. S/p PEG   Met with patient in infusion. Wife of patient present today at visit. Patient states he is "fine and dandy" today. Patient provides short responses while gazing out the window. He appears to have little interest in speaking with RD and reports this RD broke confidentiality for documenting pt reported alcohol consumption at 3/13 follow-up. Patient stated that is does not have anything to do with treatment and RD did not have a reason to inform MD of this. He is eating 4-5 small meals daily per wife. Patient has not been drinking Ensure recently. Wife asking how many he should be consuming. Patient reports drinking 25 bottles of water weekly (~57 ounces/day). He is flushing tube daily with water. Patient denies nausea, vomiting, diarrhea, constipation.    Medications: carafate, tramadol, compazine, zofran, xylocaine  Labs: glucose 157, BUN 29  Anthropometrics: Wt 268 lb today   Weight 282 lb on 3/13 is not correct per pt and his wife. Patient ~270-275 lb on home scale since start of treatment  3/11 - 277 lb 9.6 oz 3/5 - 271 lb  2/28 - 272 lb 0.8 oz   NUTRITION DIAGNOSIS: Unintended weight loss continues    INTERVENTION:  Encouraged high calorie high protein foods in soft moist textures  Recommend 2 Ensure Plus HP daily in between meals (350 kcal, 20 g each) Reinforced importance of adequate calorie and protein energy intake. If decreased po or wt decline continues recommend pt start supplemental bolus feedings to preserve lean body mass Discussed RD role as part of his care team and importance of being honest with providers about EtOH consumption as this could alter lab values/weights/hydration status. Assured patient the goal is to provide the best possible care    MONITORING, EVALUATION, GOAL: weight trends, intake, TF   NEXT VISIT: Tuesday March 26 during  infusion

## 2022-06-19 NOTE — Patient Instructions (Signed)
[facility]  Discharge Instructions: Thank you for choosing [facility] to provide your oncology and hematology care.   If you have a lab appointment with the Cancer Center, please go directly to the Cancer Center and check in at the registration area.   Wear comfortable clothing and clothing appropriate for easy access to any Portacath or PICC line.   We strive to give you quality time with your provider. You may need to reschedule your appointment if you arrive late (15 or more minutes).  Arriving late affects you and other patients whose appointments are after yours.  Also, if you miss three or more appointments without notifying the office, you may be dismissed from the clinic at the provider's discretion.      For prescription refill requests, have your pharmacy contact our office and allow 72 hours for refills to be completed.    Today you received the following chemotherapy and/or immunotherapy agents Cisplatin      To help prevent nausea and vomiting after your treatment, we encourage you to take your nausea medication as directed.  BELOW ARE SYMPTOMS THAT SHOULD BE REPORTED IMMEDIATELY: *FEVER GREATER THAN 100.4 F (38 C) OR HIGHER *CHILLS OR SWEATING *NAUSEA AND VOMITING THAT IS NOT CONTROLLED WITH YOUR NAUSEA MEDICATION *UNUSUAL SHORTNESS OF BREATH *UNUSUAL BRUISING OR BLEEDING *URINARY PROBLEMS (pain or burning when urinating, or frequent urination) *BOWEL PROBLEMS (unusual diarrhea, constipation, pain near the anus) TENDERNESS IN MOUTH AND THROAT WITH OR WITHOUT PRESENCE OF ULCERS (sore throat, sores in mouth, or a toothache) UNUSUAL RASH, SWELLING OR PAIN  UNUSUAL VAGINAL DISCHARGE OR ITCHING   Items with * indicate a potential emergency and should be followed up as soon as possible or go to the Emergency Department if any problems should occur.  Please show the CHEMOTHERAPY ALERT CARD or IMMUNOTHERAPY ALERT CARD at  check-in to the Emergency Department and triage nurse.  Should you have questions after your visit or need to cancel or reschedule your appointment, please contact [facility]  Dept: [phone]  and follow the prompts.  Office hours are 8:00 a.m. to 4:30 p.m. Monday - Friday. Please note that voicemails left after 4:00 p.m. may not be returned until the following business day.  We are closed weekends and major holidays. You have access to a nurse at all times for urgent questions. Please call the main number to the clinic Dept: [phone] and follow the prompts.   For any non-urgent questions, you may also contact your provider using MyChart. We now offer e-Visits for anyone 18 and older to request care online for non-urgent symptoms. For details visit [email or web address].   Also download the MyChart app! Go to the app store, search "MyChart", open the app, select [facility], and log in with your MyChart username and password.   

## 2022-06-20 ENCOUNTER — Other Ambulatory Visit: Payer: Self-pay

## 2022-06-20 ENCOUNTER — Ambulatory Visit
Admission: RE | Admit: 2022-06-20 | Discharge: 2022-06-20 | Disposition: A | Discharge status: Home / Self Care | Payer: 59 | Source: Ambulatory Visit | Attending: Radiation Oncology | Admitting: Radiation Oncology

## 2022-06-20 DIAGNOSIS — Z510 Encounter for antineoplastic radiation therapy: Secondary | ICD-10-CM | POA: Diagnosis not present

## 2022-06-20 LAB — RAD ONC ARIA SESSION SUMMARY
Course Elapsed Days: 17
Plan Fractions Treated to Date: 13
Plan Prescribed Dose Per Fraction: 2 Gy
Plan Total Fractions Prescribed: 35
Plan Total Prescribed Dose: 70 Gy
Reference Point Dosage Given to Date: 26 Gy
Reference Point Session Dosage Given: 2 Gy
Session Number: 13

## 2022-06-21 ENCOUNTER — Ambulatory Visit
Admission: RE | Admit: 2022-06-21 | Discharge: 2022-06-21 | Disposition: A | Discharge status: Home / Self Care | Payer: 59 | Source: Ambulatory Visit | Attending: Radiation Oncology | Admitting: Radiation Oncology

## 2022-06-21 ENCOUNTER — Other Ambulatory Visit: Payer: Self-pay

## 2022-06-21 ENCOUNTER — Encounter: Payer: Self-pay | Admitting: Hematology and Oncology

## 2022-06-21 DIAGNOSIS — Z510 Encounter for antineoplastic radiation therapy: Secondary | ICD-10-CM | POA: Diagnosis not present

## 2022-06-21 LAB — RAD ONC ARIA SESSION SUMMARY
Course Elapsed Days: 18
Plan Fractions Treated to Date: 14
Plan Prescribed Dose Per Fraction: 2 Gy
Plan Total Fractions Prescribed: 35
Plan Total Prescribed Dose: 70 Gy
Reference Point Dosage Given to Date: 28 Gy
Reference Point Session Dosage Given: 2 Gy
Session Number: 14

## 2022-06-21 NOTE — Assessment & Plan Note (Signed)
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  He is currently undergoing concurrent chemoradiation and is tolerating this is well as expected.    He continues to lose weight and is following with nutrition.  We discussed oral intake.  I reviewed his labs with him today and we also discussed energy conservation for fatigue.  We will continue to monitor him closely while under treatment.  He will proceed with therapy today.  Will see him back in about a week for labs, follow-up, and his next infusion.

## 2022-06-24 ENCOUNTER — Ambulatory Visit: Payer: 59

## 2022-06-24 ENCOUNTER — Other Ambulatory Visit: Payer: Self-pay

## 2022-06-24 ENCOUNTER — Ambulatory Visit
Admission: RE | Admit: 2022-06-24 | Discharge: 2022-06-24 | Disposition: A | Discharge status: Home / Self Care | Payer: 59 | Source: Ambulatory Visit | Attending: Radiation Oncology | Admitting: Radiation Oncology

## 2022-06-24 DIAGNOSIS — Z510 Encounter for antineoplastic radiation therapy: Secondary | ICD-10-CM | POA: Diagnosis not present

## 2022-06-24 LAB — RAD ONC ARIA SESSION SUMMARY
Course Elapsed Days: 21
Plan Fractions Treated to Date: 15
Plan Prescribed Dose Per Fraction: 2 Gy
Plan Total Fractions Prescribed: 35
Plan Total Prescribed Dose: 70 Gy
Reference Point Dosage Given to Date: 30 Gy
Reference Point Session Dosage Given: 2 Gy
Session Number: 15

## 2022-06-24 MED FILL — Dexamethasone Sodium Phosphate Inj 100 MG/10ML: INTRAMUSCULAR | Qty: 1 | Status: AC

## 2022-06-24 MED FILL — Fosaprepitant Dimeglumine For IV Infusion 150 MG (Base Eq): INTRAVENOUS | Qty: 5 | Status: AC

## 2022-06-25 ENCOUNTER — Ambulatory Visit
Admission: RE | Admit: 2022-06-25 | Discharge: 2022-06-25 | Disposition: A | Discharge status: Home / Self Care | Payer: 59 | Source: Ambulatory Visit | Attending: Radiation Oncology | Admitting: Radiation Oncology

## 2022-06-25 ENCOUNTER — Ambulatory Visit: Admission: RE | Admit: 2022-06-25 | Payer: 59 | Source: Ambulatory Visit

## 2022-06-25 ENCOUNTER — Inpatient Hospital Stay: Payer: 59

## 2022-06-25 ENCOUNTER — Other Ambulatory Visit: Payer: Self-pay

## 2022-06-25 ENCOUNTER — Inpatient Hospital Stay (HOSPITAL_BASED_OUTPATIENT_CLINIC_OR_DEPARTMENT_OTHER): Payer: 59 | Admitting: Hematology and Oncology

## 2022-06-25 ENCOUNTER — Inpatient Hospital Stay: Payer: 59 | Admitting: Dietician

## 2022-06-25 VITALS — BP 143/80 | HR 81 | Temp 98.1°F | Resp 16 | Ht 73.0 in | Wt 262.7 lb

## 2022-06-25 DIAGNOSIS — Z95828 Presence of other vascular implants and grafts: Secondary | ICD-10-CM

## 2022-06-25 DIAGNOSIS — C090 Malignant neoplasm of tonsillar fossa: Secondary | ICD-10-CM

## 2022-06-25 DIAGNOSIS — Z510 Encounter for antineoplastic radiation therapy: Secondary | ICD-10-CM | POA: Diagnosis not present

## 2022-06-25 LAB — RAD ONC ARIA SESSION SUMMARY
Course Elapsed Days: 22
Plan Fractions Treated to Date: 16
Plan Prescribed Dose Per Fraction: 2 Gy
Plan Total Fractions Prescribed: 35
Plan Total Prescribed Dose: 70 Gy
Reference Point Dosage Given to Date: 32 Gy
Reference Point Session Dosage Given: 2 Gy
Session Number: 16

## 2022-06-25 LAB — CBC WITH DIFFERENTIAL (CANCER CENTER ONLY)
Abs Immature Granulocytes: 0.02 10*3/uL (ref 0.00–0.07)
Basophils Absolute: 0 10*3/uL (ref 0.0–0.1)
Basophils Relative: 0 %
Eosinophils Absolute: 0 10*3/uL (ref 0.0–0.5)
Eosinophils Relative: 1 %
HCT: 30.6 % — ABNORMAL LOW (ref 39.0–52.0)
Hemoglobin: 10.6 g/dL — ABNORMAL LOW (ref 13.0–17.0)
Immature Granulocytes: 0 %
Lymphocytes Relative: 7 %
Lymphs Abs: 0.3 10*3/uL — ABNORMAL LOW (ref 0.7–4.0)
MCH: 29.2 pg (ref 26.0–34.0)
MCHC: 34.6 g/dL (ref 30.0–36.0)
MCV: 84.3 fL (ref 80.0–100.0)
Monocytes Absolute: 0.4 10*3/uL (ref 0.1–1.0)
Monocytes Relative: 8 %
Neutro Abs: 3.8 10*3/uL (ref 1.7–7.7)
Neutrophils Relative %: 84 %
Platelet Count: 118 10*3/uL — ABNORMAL LOW (ref 150–400)
RBC: 3.63 MIL/uL — ABNORMAL LOW (ref 4.22–5.81)
RDW: 15.7 % — ABNORMAL HIGH (ref 11.5–15.5)
WBC Count: 4.6 10*3/uL (ref 4.0–10.5)
nRBC: 0 % (ref 0.0–0.2)

## 2022-06-25 LAB — BASIC METABOLIC PANEL - CANCER CENTER ONLY
Anion gap: 5 (ref 5–15)
BUN: 16 mg/dL (ref 6–20)
CO2: 27 mmol/L (ref 22–32)
Calcium: 8.6 mg/dL — ABNORMAL LOW (ref 8.9–10.3)
Chloride: 107 mmol/L (ref 98–111)
Creatinine: 0.96 mg/dL (ref 0.61–1.24)
GFR, Estimated: >60 mL/min (ref >60)
Glucose, Bld: 102 mg/dL — ABNORMAL HIGH (ref 70–99)
Potassium: 3.5 mmol/L (ref 3.5–5.1)
Sodium: 139 mmol/L (ref 135–145)

## 2022-06-25 LAB — MAGNESIUM: Magnesium: 1.6 mg/dL — ABNORMAL LOW (ref 1.7–2.4)

## 2022-06-25 MED ORDER — SODIUM CHLORIDE 0.9 % IV SOLN
150.0000 mg | Freq: Once | INTRAVENOUS | Status: AC
  Administered 2022-06-25: 150 mg via INTRAVENOUS
  Filled 2022-06-25: qty 150

## 2022-06-25 MED ORDER — SODIUM CHLORIDE 0.9 % IV SOLN
10.0000 mg | Freq: Once | INTRAVENOUS | Status: AC
  Administered 2022-06-25: 10 mg via INTRAVENOUS
  Filled 2022-06-25: qty 10

## 2022-06-25 MED ORDER — PALONOSETRON HCL INJECTION 0.25 MG/5ML
0.2500 mg | Freq: Once | INTRAVENOUS | Status: AC
  Administered 2022-06-25: 0.25 mg via INTRAVENOUS
  Filled 2022-06-25: qty 5

## 2022-06-25 MED ORDER — SODIUM CHLORIDE 0.9 % IV SOLN: Freq: Once | INTRAVENOUS | Status: AC

## 2022-06-25 MED ORDER — SODIUM CHLORIDE 0.9 % IV SOLN
40.0000 mg/m2 | Freq: Once | INTRAVENOUS | Status: AC
  Administered 2022-06-25: 100 mg via INTRAVENOUS
  Filled 2022-06-25: qty 100

## 2022-06-25 MED ORDER — POTASSIUM CHLORIDE IN NACL 20-0.9 MEQ/L-% IV SOLN
Freq: Once | INTRAVENOUS | Status: AC
  Filled 2022-06-25: qty 1000

## 2022-06-25 MED ORDER — MAGNESIUM SULFATE 2 GM/50ML IV SOLN
2.0000 g | Freq: Once | INTRAVENOUS | Status: AC
  Administered 2022-06-25: 2 g via INTRAVENOUS
  Filled 2022-06-25: qty 50

## 2022-06-25 MED ORDER — SODIUM CHLORIDE 0.9% FLUSH
10.0000 mL | Freq: Once | INTRAVENOUS | Status: AC
  Administered 2022-06-25: 10 mL

## 2022-06-25 MED ORDER — SODIUM CHLORIDE 0.9% FLUSH
10.0000 mL | INTRAVENOUS | Status: DC | PRN
  Administered 2022-06-25: 10 mL

## 2022-06-25 MED ORDER — HEPARIN SOD (PORK) LOCK FLUSH 100 UNIT/ML IV SOLN
500.0000 [IU] | Freq: Once | INTRAVENOUS | Status: AC | PRN
  Administered 2022-06-25: 500 [IU]

## 2022-06-25 NOTE — Progress Notes (Signed)
Per MD, ok to treat with MAG 1.6

## 2022-06-25 NOTE — Patient Instructions (Signed)
[facility]  Discharge Instructions: Thank you for choosing [facility] to provide your oncology and hematology care.   If you have a lab appointment with the Cancer Center, please go directly to the Cancer Center and check in at the registration area.   Wear comfortable clothing and clothing appropriate for easy access to any Portacath or PICC line.   We strive to give you quality time with your provider. You may need to reschedule your appointment if you arrive late (15 or more minutes).  Arriving late affects you and other patients whose appointments are after yours.  Also, if you miss three or more appointments without notifying the office, you may be dismissed from the clinic at the provider's discretion.      For prescription refill requests, have your pharmacy contact our office and allow 72 hours for refills to be completed.    Today you received the following chemotherapy and/or immunotherapy agents Cisplatin      To help prevent nausea and vomiting after your treatment, we encourage you to take your nausea medication as directed.  BELOW ARE SYMPTOMS THAT SHOULD BE REPORTED IMMEDIATELY: *FEVER GREATER THAN 100.4 F (38 C) OR HIGHER *CHILLS OR SWEATING *NAUSEA AND VOMITING THAT IS NOT CONTROLLED WITH YOUR NAUSEA MEDICATION *UNUSUAL SHORTNESS OF BREATH *UNUSUAL BRUISING OR BLEEDING *URINARY PROBLEMS (pain or burning when urinating, or frequent urination) *BOWEL PROBLEMS (unusual diarrhea, constipation, pain near the anus) TENDERNESS IN MOUTH AND THROAT WITH OR WITHOUT PRESENCE OF ULCERS (sore throat, sores in mouth, or a toothache) UNUSUAL RASH, SWELLING OR PAIN  UNUSUAL VAGINAL DISCHARGE OR ITCHING   Items with * indicate a potential emergency and should be followed up as soon as possible or go to the Emergency Department if any problems should occur.  Please show the CHEMOTHERAPY ALERT CARD or IMMUNOTHERAPY ALERT CARD at  check-in to the Emergency Department and triage nurse.  Should you have questions after your visit or need to cancel or reschedule your appointment, please contact [facility]  Dept: [phone]  and follow the prompts.  Office hours are 8:00 a.m. to 4:30 p.m. Monday - Friday. Please note that voicemails left after 4:00 p.m. may not be returned until the following business day.  We are closed weekends and major holidays. You have access to a nurse at all times for urgent questions. Please call the main number to the clinic Dept: [phone] and follow the prompts.   For any non-urgent questions, you may also contact your provider using MyChart. We now offer e-Visits for anyone 18 and older to request care online for non-urgent symptoms. For details visit [email or web address].   Also download the MyChart app! Go to the app store, search "MyChart", open the app, select , and log in with your MyChart username and password.   

## 2022-06-25 NOTE — Progress Notes (Signed)
[facility] NOTE  Patient Care Team: Janie Morning, DO as PCP - General (Family Medicine) Stanford Breed, Keenan Bachelor, MD as Referring Physician (Otolaryngology) Eppie Gibson, MD as Consulting Physician (Radiation Oncology) Benay Pike, MD as Consulting Physician (Hematology and Oncology) Malmfelt, Stephani Police, RN as Oncology Nurse Navigator  CHIEF COMPLAINTS/PURPOSE OF CONSULTATION:  SCC tonsil.  ASSESSMENT & PLAN:   This is a very pleasant 57 yr old male patient with HTN, Prostate cancer referred to ENT oncology for new diagnosis of SCC tonsil, P16. He first noticed some sore throat, difficulty swallowing and some slurred speech and was investigated for the same. PE demonstrated right tonsillar mass on exam, some slurred speech, no definitive palpable LN. He is now s.p 3 cycles of weekly cisplatin. No palpable cervical lymphadenopathy on exam today.  Right tonsillar mass visualized.  He denies any major pain, has not been even using viscous lidocaine.  He is reluctant to use the G-tube, he would like to continue eating as best as he can.  He has lost about 5 pounds of weight since his last visit.  We encouraged him to add calorie dense foods like smoothies, shakes or use the G-tube to give himself some ensures on days when he is not eating.  He expressed understanding.  He otherwise denies any adverse effects from chemotherapy.  He will continue weekly chemo as planned and return to clinic next week for follow-up.  Benay Pike MD    HISTORY OF PRESENTING ILLNESS:  Aaron Moore 57 y.o. male is here because of SCC Tonsil  This is a very pleasant 57 yr old male patient with PMH significant for prostate cancer, amputation of toe, HTN , obesity referred to Head and neck oncology because of his new diagnosis of SCC tonsil. Many months ago, patient noticed some hoarseness and sore throat. He was referred to ENT doctor. He had a biopsy while he was in office. Biopsy  showed small fragments of squamous mucosa concerning for SCC in situ. He then had PET CT imaging which showed right avid tonsillar lesion and right nodal metastasis. He was reviewed in our H and N tumor board and the size of the tonsillar lesion is estimated to be above 4 cms. Given size of tumor greater than 4 cms, and positive LN, we have discussed that its best to proceed with concurrent chemoradiation.   He started first cycle of chemotherapy 06/05/2022. He has completed 3 cycles of chemotherapy so far. He is now on week 4 of chemotherapy. He lost about 5 lbs since last visit, not using the G tube. He is able to swallow soft foods, feels like he can eat more on the weekends. He says everything tastes the same. He denies any side effects from chemotherapy. Rest of the pertinent 10 point ROS reviewed and negative.  Wt Readings from Last 3 Encounters:  06/25/22 262 lb 11.2 oz (119.2 kg)  06/19/22 268 lb (121.6 kg)  06/12/22 282 lb (127.9 kg)     MEDICAL HISTORY:  Past Medical History:  Diagnosis Date   Abdominal pain    Cancer ([place])    prostate cancer   Complication of anesthesia    slow to wake up 3 years ago after foot surgery   ED (erectile dysfunction)    Gout    Gout    History of kidney stones    Hypertension    Obesity, Class II, BMI 35-39.9, with comorbidity    Painful orthopaedic hardware ([place])  left foot    SURGICAL HISTORY: Past Surgical History:  Procedure Laterality Date   amputation of 2 toes on left foot      CARPAL TUNNEL RELEASE  05/03/2011   Procedure: CARPAL TUNNEL RELEASE;  Surgeon: Wynonia Sours, MD;  Location: [facility];  Service: Orthopedics;  Laterality: Left;   FOOT FRACTURE SURGERY Left    x3   HARDWARE REMOVAL Left 06/03/2019   Procedure: HARDWARE REMOVAL;  Surgeon: Erle Crocker, MD;  Location: [facility];  Service: Orthopedics;  Laterality: Left;  PROCEDURE: LEFT FOOT REMOVAL OF HARDWARE, IRRIGATION AND  DEBRIDEMENT,PLACEMENT OF CEMENT SPACER, DEBULKIN, SOFT TISSUE MASS LENGTH OF SURGERY: 2 HOURS   HERNIA REPAIR  02/06/2009   Lap supraumb & umb [facility] repairs   INCISION AND DRAINAGE OF WOUND Left 06/03/2019   Procedure: IRRIGATION AND DEBRIDEMENT WOUND WITH IMPLANTATION OF ANTIBIOTIC CEMENT SPACER;  Surgeon: Erle Crocker, MD;  Location: [facility];  Service: Orthopedics;  Laterality: Left;   IR GASTROSTOMY TUBE MOD SED  05/29/2022   IR IMAGING GUIDED PORT INSERTION  05/29/2022   left rotator cuff surgery      LYMPHADENECTOMY Bilateral 02/07/2022   Procedure: BILATERAL PELVIC LYMPHADENECTOMY;  Surgeon: Raynelle Bring, MD;  Location: [facility];  Service: Urology;  Laterality: Bilateral;   MASS EXCISION Left 06/03/2019   Procedure: DEBULKING LEFT FOOT MASS;  Surgeon: Erle Crocker, MD;  Location: [facility];  Service: Orthopedics;  Laterality: Left;   ROBOT ASSISTED LAPAROSCOPIC RADICAL PROSTATECTOMY N/A 02/07/2022   Procedure: XI ROBOTIC ASSISTED LAPAROSCOPIC RADICAL PROSTATECTOMY LEVEL 3;  Surgeon: Raynelle Bring, MD;  Location: [facility];  Service: Urology;  Laterality: N/A;  210 MINUTES NEEDED FOR CASE    SOCIAL HISTORY: Social History   Socioeconomic History   Marital status: Married    Spouse name: Not on file   Number of children: Not on file   Years of education: Not on file   Highest education level: Not on file  Occupational History   Not on file  Tobacco Use   Smoking status: Never   Smokeless tobacco: Never  Vaping Use   Vaping Use: Never used  Substance and Sexual Activity   Alcohol use: No   Drug use: No   Sexual activity: Not on file  Other Topics Concern   Not on file  Social History Narrative   Not on file   Social Determinants of Health   Financial Resource Strain: Not on file  Food Insecurity: No Food Insecurity (02/07/2022)   Hunger Vital Sign    Worried About Running Out of Food in the Last Year: Never true    Ran Out of  Food in the Last Year: Never true  Transportation Needs: No Transportation Needs (02/07/2022)   PRAPARE - Hydrologist (Medical): No    Lack of Transportation (Non-Medical): No  Physical Activity: Not on file  Stress: Not on file  Social Connections: Not on file  Intimate Partner Violence: Not At Risk (02/07/2022)   Humiliation, Afraid, Rape, and Kick questionnaire    Fear of Current or Ex-Partner: No    Emotionally Abused: No    Physically Abused: No    Sexually Abused: No    FAMILY HISTORY: Family History  Problem Relation Age of Onset   Diabetes Mother    Diabetes Father     ALLERGIES:  has No Known Allergies.  MEDICATIONS:  Current Outpatient Medications  Medication Sig  Dispense Refill   amLODipine-valsartan (EXFORGE) 10-320 MG tablet Take 1 tablet by mouth daily.     dexamethasone (DECADRON) 4 MG tablet Take 2 tablets daily x 3 days starting the day after cisplatin chemotherapy. Take with food. 30 tablet 1   lidocaine (XYLOCAINE) 2 % solution Patient: Mix 1part 2% viscous lidocaine, 1part H20. Swish & swallow 61mL of diluted mixture, 27min before meals and at bedtime, up to QID 200 mL 3   lidocaine-prilocaine (EMLA) cream Apply to affected area once (Patient not taking: Reported on 06/19/2022) 30 g 3   ondansetron (ZOFRAN) 8 MG tablet Take 1 tablet (8 mg total) by mouth every 8 (eight) hours as needed for nausea or vomiting. Start on the third day after cisplatin. (Patient not taking: Reported on 06/19/2022) 30 tablet 1   prochlorperazine (COMPAZINE) 10 MG tablet Take 1 tablet (10 mg total) by mouth every 6 (six) hours as needed (Nausea or vomiting). (Patient not taking: Reported on 06/19/2022) 30 tablet 1   sucralfate (CARAFATE) 1 g tablet Dissolve 1 tablet in 10 mL H20 and swish/swallow 30 min prior to meals and bedtime. 40 tablet 3   No current facility-administered medications for this visit.    PHYSICAL EXAMINATION: ECOG PERFORMANCE STATUS: 0 -  Asymptomatic  Vitals:   06/25/22 0908  BP: (!) 143/80  Pulse: 81  Resp: 16  Temp: 98.1 F (36.7 C)  SpO2: 100%   Filed Weights   06/25/22 0908  Weight: 262 lb 11.2 oz (119.2 kg)    GENERAL:alert, no distress and comfortable HEENT, mild mucositis, next chest port site and G-tube site appears well No palpable adenopathy in the cervical region.  Right tonsillar mass appreciated on exam today. No lower extremity edema  LABORATORY DATA:  I have reviewed the data as listed Lab Results  Component Value Date   WBC 4.6 06/25/2022   HGB 10.6 (L) 06/25/2022   HCT 30.6 (L) 06/25/2022   MCV 84.3 06/25/2022   PLT 118 (L) 06/25/2022     Chemistry      Component Value Date/Time   NA 138 06/19/2022 0815   K 3.5 06/19/2022 0815   CL 104 06/19/2022 0815   CO2 26 06/19/2022 0815   BUN 29 (H) 06/19/2022 0815   CREATININE 1.12 06/19/2022 0815      Component Value Date/Time   CALCIUM 8.5 (L) 06/19/2022 0815   ALKPHOS 72 06/10/2022 0853   AST 45 (H) 06/10/2022 0853   ALT 171 (H) 06/10/2022 0853   BILITOT 0.4 06/10/2022 0853       RADIOGRAPHIC STUDIES: I have personally reviewed the radiological images as listed and agreed with the findings in the report. IR Gastrostomy Tube  Result Date: 05/29/2022 CLINICAL DATA:  Left tonsillar squamous carcinoma and need for gastrostomy tube for nutrition during treatment. EXAM: PERCUTANEOUS GASTROSTOMY TUBE PLACEMENT ANESTHESIA/SEDATION: Moderate (conscious) sedation was employed during this procedure. A total of Versed 1.0 mg and Fentanyl 100 mcg was administered intravenously by radiology nursing. Moderate Sedation Time: 15 minutes. The patient's level of consciousness and vital signs were monitored continuously by radiology nursing throughout the procedure under my direct supervision. CONTRAST:  30mL OMNIPAQUE IOHEXOL 300 MG/ML  SOLN MEDICATIONS: 3 g IV Ancef. IV antibiotic was administered in an appropriate time interval prior to needle puncture  of the skin. FLUOROSCOPY TIME:  2 minutes and 48 seconds. 63 mGy. PROCEDURE: The procedure, risks, benefits, and alternatives were explained to the patient. Questions regarding the procedure were encouraged and answered. The patient  understands and consents to the procedure. A time-out was performed prior to initiating the procedure. A 5-French catheter was then advanced through the patient's mouth under fluoroscopy into the esophagus and to the level of the stomach. This catheter was used to insufflate the stomach with air under fluoroscopy. The abdominal wall was prepped with chlorhexidine in a sterile fashion, and a sterile drape was applied covering the operative field. A sterile gown and sterile gloves were used for the procedure. Local anesthesia was provided with 1% Lidocaine. A skin incision was made in the upper abdominal wall. Under fluoroscopy, an 18 gauge trocar needle was advanced into the stomach. Contrast injection was performed to confirm intraluminal position of the needle tip. A single T tack was then deployed in the lumen of the stomach. This was brought up to tension at the skin surface. Over a guidewire, a 9-French sheath was advanced into the lumen of the stomach. The wire was left in place as a safety wire. A loop snare device from a percutaneous gastrostomy kit was then advanced into the stomach. A floppy guide wire was advanced through the orogastric catheter under fluoroscopy in the stomach. The loop snare advanced through the percutaneous gastric access was used to snare the guide wire. This allowed withdrawal of the loop snare out of the patient's mouth by retraction of the orogastric catheter and wire. A 20-French bumper retention gastrostomy tube was looped around the snare device. It was then pulled back through the patient's mouth. The retention bumper was brought up to the anterior gastric wall. The T tack suture was cut at the skin. The exiting gastrostomy tube was cut to  appropriate length and a feeding adapter applied. The catheter was injected with contrast material to confirm position and a fluoroscopic spot image saved. The tube was then flushed with saline. A dressing was applied over the gastrostomy exit site. COMPLICATIONS: None. FINDINGS: The stomach distended well with air allowing safe placement of the gastrostomy tube. After placement, the tip of the gastrostomy tube lies in the body of the stomach. IMPRESSION: Percutaneous gastrostomy with placement of a 20-French bumper retention tube in the body of the stomach. This tube can be used for percutaneous feeds beginning in 24 hours after placement. Electronically Signed   By: Aletta Edouard M.D.   On: 05/29/2022 14:18   IR IMAGING GUIDED PORT INSERTION  Result Date: 05/29/2022 CLINICAL DATA:  Left tonsillar squamous carcinoma and need for porta cath to begin chemotherapy. EXAM: IMPLANTED PORT A CATH PLACEMENT WITH ULTRASOUND AND FLUOROSCOPIC GUIDANCE ANESTHESIA/SEDATION: Moderate (conscious) sedation was employed during this procedure. A total of Versed 3.0 mg and Fentanyl 100 mcg was administered intravenously. Moderate Sedation Time: 30 minutes. The patient's level of consciousness and vital signs were monitored continuously by radiology nursing throughout the procedure under my direct supervision. FLUOROSCOPY: 30 seconds.  6.0 mGy. PROCEDURE: The procedure, risks, benefits, and alternatives were explained to the patient. Questions regarding the procedure were encouraged and answered. The patient understands and consents to the procedure. A time-out was performed prior to initiating the procedure. Ultrasound was utilized to confirm patency of the right internal jugular vein. A permanent ultrasound image was saved and recorded. The right neck and chest were prepped with chlorhexidine in a sterile fashion, and a sterile drape was applied covering the operative field. Maximum barrier sterile technique with sterile gowns  and gloves were used for the procedure. Local anesthesia was provided with 1% lidocaine. After creating a small venotomy incision, a 21  gauge needle was advanced into the right internal jugular vein under direct, real-time ultrasound guidance. Ultrasound image documentation was performed. After securing guidewire access, an 8 Fr dilator was placed. A J-wire was kinked to measure appropriate catheter length. A subcutaneous port pocket was then created along the upper chest wall utilizing sharp and blunt dissection. Portable cautery was utilized. The pocket was irrigated with sterile saline. A single lumen power injectable port was chosen for placement. The 8 Fr catheter was tunneled from the port pocket site to the venotomy incision. The port was placed in the pocket. External catheter was trimmed to appropriate length based on guidewire measurement. At the venotomy, an 8 Fr peel-away sheath was placed over a guidewire. The catheter was then placed through the sheath and the sheath removed. Final catheter positioning was confirmed and documented with a fluoroscopic spot image. The port was accessed with a needle and aspirated and flushed with heparinized saline. The access needle was removed. The venotomy and port pocket incisions were closed with subcutaneous 3-0 Monocryl and subcuticular 4-0 Vicryl. Dermabond was applied to both incisions. COMPLICATIONS: COMPLICATIONS None FINDINGS: After catheter placement, the tip lies at the cavo-atrial junction. The catheter aspirates normally and is ready for immediate use. IMPRESSION: Placement of single lumen port a cath via right internal jugular vein. The catheter tip lies at the cavo-atrial junction. A power injectable port a cath was placed and is ready for immediate use. Electronically Signed   By: Aletta Edouard M.D.   On: 05/29/2022 13:19    All questions were answered. The patient knows to call the clinic with any problems, questions or concerns. I spent 30  minutes in the care of this patient including H and P, review of records, counseling and coordination of care.     Benay Pike, MD 06/25/2022 9:13 AM

## 2022-06-25 NOTE — Progress Notes (Signed)
Nutrition Follow-up:  Patient with SCC of right tonsil, P16 positive. He is receiving concurrent chemoradiation. S/p PEG   Met with patient in infusion. He reports decreased oral intake. Patient says food taste burnt. He is unable to eat meat. This taste horrible. He is tolerating soft textures without difficulty. Recalls bowl of chicken noodle soup, can of tomato soup with added milk, and can of ravioli yesterday. Patient states he stops and gets a milkshake a couple times/week. He is not drinking Ensure everyday. Patient states he is not drinking as much water as he should. His mouth "stays dry." His notes bottom lip is dry at cracking. Patient is flushing tube daily with water. He reports abdominal soreness from tube hanging. Patient requesting additional mesh underwear to keep tube in place. Patient is hesitant to use tube to supplement oral intake. He prefers to increase oral intake. Patient agreeable to start using tube next week if he has additional weight loss.    Medications: reviewed   Labs: Mg 1.6  Anthropometrics: Wt 262 lb 11.2 oz today decreased   3/20 - 268 lb 3/11 - 277 lb 9.6 oz   5% (-15 lb) in 2 weeks - severe   NUTRITION DIAGNOSIS: Unintended weight loss continues - pt currently not using tube   INTERVENTION:  Reinforced importance of adequate energy intake to preserve lean body mass Encouraged high calorie high protein foods - reviewed soft smooth textures that are easy to swallow Encouraged 3-4 Ensure Complete (pt likes chocolate)  Continue drinking high calorie shakes. Suggested adding Ensure to ice cream at home. RD made chocolate/PB shake with Ensure Complete during infusion today Patient agreeable to initiating TF next week if unable to increase po/maintain wt    MONITORING, EVALUATION, GOAL: weight trends, intake, TF   NEXT VISIT: Wednesday April 10 during infusion

## 2022-06-26 ENCOUNTER — Other Ambulatory Visit: Payer: Self-pay

## 2022-06-26 ENCOUNTER — Ambulatory Visit
Admission: RE | Admit: 2022-06-26 | Discharge: 2022-06-26 | Disposition: A | Discharge status: Home / Self Care | Payer: 59 | Source: Ambulatory Visit | Attending: Radiation Oncology | Admitting: Radiation Oncology

## 2022-06-26 ENCOUNTER — Other Ambulatory Visit: Payer: Self-pay | Admitting: Physician Assistant

## 2022-06-26 DIAGNOSIS — Z510 Encounter for antineoplastic radiation therapy: Secondary | ICD-10-CM | POA: Diagnosis not present

## 2022-06-26 LAB — RAD ONC ARIA SESSION SUMMARY
Course Elapsed Days: 23
Plan Fractions Treated to Date: 17
Plan Prescribed Dose Per Fraction: 2 Gy
Plan Total Fractions Prescribed: 35
Plan Total Prescribed Dose: 70 Gy
Reference Point Dosage Given to Date: 34 Gy
Reference Point Session Dosage Given: 2 Gy
Session Number: 17

## 2022-06-27 ENCOUNTER — Other Ambulatory Visit: Payer: Self-pay

## 2022-06-27 ENCOUNTER — Ambulatory Visit
Admission: RE | Admit: 2022-06-27 | Discharge: 2022-06-27 | Disposition: A | Discharge status: Home / Self Care | Payer: 59 | Source: Ambulatory Visit | Attending: Radiation Oncology | Admitting: Radiation Oncology

## 2022-06-27 DIAGNOSIS — Z510 Encounter for antineoplastic radiation therapy: Secondary | ICD-10-CM | POA: Diagnosis not present

## 2022-06-27 LAB — RAD ONC ARIA SESSION SUMMARY
Course Elapsed Days: 24
Plan Fractions Treated to Date: 18
Plan Prescribed Dose Per Fraction: 2 Gy
Plan Total Fractions Prescribed: 35
Plan Total Prescribed Dose: 70 Gy
Reference Point Dosage Given to Date: 36 Gy
Reference Point Session Dosage Given: 2 Gy
Session Number: 18

## 2022-06-28 ENCOUNTER — Other Ambulatory Visit: Payer: Self-pay

## 2022-06-28 ENCOUNTER — Ambulatory Visit
Admission: RE | Admit: 2022-06-28 | Discharge: 2022-06-28 | Disposition: A | Discharge status: Home / Self Care | Payer: 59 | Source: Ambulatory Visit | Attending: Radiation Oncology | Admitting: Radiation Oncology

## 2022-06-28 DIAGNOSIS — Z510 Encounter for antineoplastic radiation therapy: Secondary | ICD-10-CM | POA: Diagnosis not present

## 2022-06-28 LAB — RAD ONC ARIA SESSION SUMMARY
Course Elapsed Days: 25
Plan Fractions Treated to Date: 19
Plan Prescribed Dose Per Fraction: 2 Gy
Plan Total Fractions Prescribed: 35
Plan Total Prescribed Dose: 70 Gy
Reference Point Dosage Given to Date: 38 Gy
Reference Point Session Dosage Given: 2 Gy
Session Number: 19

## 2022-07-01 ENCOUNTER — Other Ambulatory Visit: Payer: Self-pay

## 2022-07-01 ENCOUNTER — Ambulatory Visit
Admission: RE | Admit: 2022-07-01 | Discharge: 2022-07-01 | Disposition: A | Discharge status: Home / Self Care | Payer: 59 | Source: Ambulatory Visit | Attending: Radiation Oncology | Admitting: Radiation Oncology

## 2022-07-01 DIAGNOSIS — G893 Neoplasm related pain (acute) (chronic): Secondary | ICD-10-CM | POA: Diagnosis not present

## 2022-07-01 DIAGNOSIS — K1230 Oral mucositis (ulcerative), unspecified: Secondary | ICD-10-CM | POA: Diagnosis not present

## 2022-07-01 DIAGNOSIS — C090 Malignant neoplasm of tonsillar fossa: Secondary | ICD-10-CM | POA: Insufficient documentation

## 2022-07-01 DIAGNOSIS — D61818 Other pancytopenia: Secondary | ICD-10-CM | POA: Diagnosis not present

## 2022-07-01 DIAGNOSIS — Z79899 Other long term (current) drug therapy: Secondary | ICD-10-CM | POA: Diagnosis not present

## 2022-07-01 DIAGNOSIS — Z510 Encounter for antineoplastic radiation therapy: Secondary | ICD-10-CM | POA: Diagnosis present

## 2022-07-01 DIAGNOSIS — Z5111 Encounter for antineoplastic chemotherapy: Secondary | ICD-10-CM | POA: Diagnosis present

## 2022-07-01 LAB — RAD ONC ARIA SESSION SUMMARY
Course Elapsed Days: 28
Plan Fractions Treated to Date: 20
Plan Prescribed Dose Per Fraction: 2 Gy
Plan Total Fractions Prescribed: 35
Plan Total Prescribed Dose: 70 Gy
Reference Point Dosage Given to Date: 40 Gy
Reference Point Session Dosage Given: 2 Gy
Session Number: 20

## 2022-07-02 ENCOUNTER — Other Ambulatory Visit: Payer: Self-pay

## 2022-07-02 ENCOUNTER — Inpatient Hospital Stay (HOSPITAL_BASED_OUTPATIENT_CLINIC_OR_DEPARTMENT_OTHER): Payer: 59 | Admitting: Physician Assistant

## 2022-07-02 ENCOUNTER — Other Ambulatory Visit (HOSPITAL_COMMUNITY): Payer: Self-pay

## 2022-07-02 ENCOUNTER — Inpatient Hospital Stay: Payer: 59 | Attending: Hematology and Oncology

## 2022-07-02 ENCOUNTER — Ambulatory Visit: Payer: 59

## 2022-07-02 ENCOUNTER — Ambulatory Visit
Admission: RE | Admit: 2022-07-02 | Discharge: 2022-07-02 | Disposition: A | Discharge status: Home / Self Care | Payer: 59 | Source: Ambulatory Visit | Attending: Radiation Oncology | Admitting: Radiation Oncology

## 2022-07-02 VITALS — BP 134/84 | HR 71 | Temp 98.6°F | Resp 14 | Wt 260.1 lb

## 2022-07-02 DIAGNOSIS — Z510 Encounter for antineoplastic radiation therapy: Secondary | ICD-10-CM | POA: Diagnosis not present

## 2022-07-02 DIAGNOSIS — D61818 Other pancytopenia: Secondary | ICD-10-CM | POA: Diagnosis not present

## 2022-07-02 DIAGNOSIS — G893 Neoplasm related pain (acute) (chronic): Secondary | ICD-10-CM

## 2022-07-02 DIAGNOSIS — C090 Malignant neoplasm of tonsillar fossa: Secondary | ICD-10-CM

## 2022-07-02 DIAGNOSIS — Z5111 Encounter for antineoplastic chemotherapy: Secondary | ICD-10-CM | POA: Insufficient documentation

## 2022-07-02 DIAGNOSIS — Z79899 Other long term (current) drug therapy: Secondary | ICD-10-CM | POA: Insufficient documentation

## 2022-07-02 DIAGNOSIS — K1230 Oral mucositis (ulcerative), unspecified: Secondary | ICD-10-CM | POA: Insufficient documentation

## 2022-07-02 LAB — CBC WITH DIFFERENTIAL (CANCER CENTER ONLY)
Abs Immature Granulocytes: 0.01 10*3/uL (ref 0.00–0.07)
Basophils Absolute: 0 10*3/uL (ref 0.0–0.1)
Basophils Relative: 0 %
Eosinophils Absolute: 0 10*3/uL (ref 0.0–0.5)
Eosinophils Relative: 1 %
HCT: 28.7 % — ABNORMAL LOW (ref 39.0–52.0)
Hemoglobin: 9.9 g/dL — ABNORMAL LOW (ref 13.0–17.0)
Immature Granulocytes: 0 %
Lymphocytes Relative: 4 %
Lymphs Abs: 0.2 10*3/uL — ABNORMAL LOW (ref 0.7–4.0)
MCH: 29.5 pg (ref 26.0–34.0)
MCHC: 34.5 g/dL (ref 30.0–36.0)
MCV: 85.4 fL (ref 80.0–100.0)
Monocytes Absolute: 0.3 10*3/uL (ref 0.1–1.0)
Monocytes Relative: 9 %
Neutro Abs: 3.3 10*3/uL (ref 1.7–7.7)
Neutrophils Relative %: 86 %
Platelet Count: 95 10*3/uL — ABNORMAL LOW (ref 150–400)
RBC: 3.36 MIL/uL — ABNORMAL LOW (ref 4.22–5.81)
RDW: 15.8 % — ABNORMAL HIGH (ref 11.5–15.5)
WBC Count: 3.9 10*3/uL — ABNORMAL LOW (ref 4.0–10.5)
nRBC: 0 % (ref 0.0–0.2)

## 2022-07-02 LAB — BASIC METABOLIC PANEL - CANCER CENTER ONLY
Anion gap: 5 (ref 5–15)
BUN: 15 mg/dL (ref 6–20)
CO2: 28 mmol/L (ref 22–32)
Calcium: 8.4 mg/dL — ABNORMAL LOW (ref 8.9–10.3)
Chloride: 108 mmol/L (ref 98–111)
Creatinine: 0.95 mg/dL (ref 0.61–1.24)
GFR, Estimated: >60 mL/min (ref >60)
Glucose, Bld: 94 mg/dL (ref 70–99)
Potassium: 3.6 mmol/L (ref 3.5–5.1)
Sodium: 141 mmol/L (ref 135–145)

## 2022-07-02 LAB — RAD ONC ARIA SESSION SUMMARY
Course Elapsed Days: 29
Plan Fractions Treated to Date: 21
Plan Prescribed Dose Per Fraction: 2 Gy
Plan Total Fractions Prescribed: 35
Plan Total Prescribed Dose: 70 Gy
Reference Point Dosage Given to Date: 42 Gy
Reference Point Session Dosage Given: 2 Gy
Session Number: 21

## 2022-07-02 LAB — MAGNESIUM: Magnesium: 1.6 mg/dL — ABNORMAL LOW (ref 1.7–2.4)

## 2022-07-02 MED ORDER — HEPARIN SOD (PORK) LOCK FLUSH 100 UNIT/ML IV SOLN
500.0000 [IU] | Freq: Once | INTRAVENOUS | Status: AC
  Administered 2022-07-02: 500 [IU] via INTRAVENOUS

## 2022-07-02 MED ORDER — HYDROCODONE-ACETAMINOPHEN 7.5-325 MG/15ML PO SOLN
15.0000 mL | Freq: Four times a day (QID) | ORAL | 0 refills | Status: DC | PRN
  Filled 2022-07-02: qty 120, 2d supply, fill #0

## 2022-07-02 MED ORDER — SODIUM CHLORIDE 0.9% FLUSH
10.0000 mL | Freq: Once | INTRAVENOUS | Status: AC
  Administered 2022-07-02: 10 mL via INTRAVENOUS

## 2022-07-02 MED FILL — Fosaprepitant Dimeglumine For IV Infusion 150 MG (Base Eq): INTRAVENOUS | Qty: 5 | Status: AC

## 2022-07-02 MED FILL — Dexamethasone Sodium Phosphate Inj 100 MG/10ML: INTRAMUSCULAR | Qty: 1 | Status: AC

## 2022-07-02 NOTE — Progress Notes (Signed)
[name]    Patient Care Team: Janie Morning, DO as PCP - General (Family Medicine) Stanford Breed Keenan Bachelor, MD as Referring Physician (Otolaryngology) Eppie Gibson, MD as Consulting Physician (Radiation Oncology) Benay Pike, MD as Consulting Physician (Hematology and Oncology) Malmfelt, Stephani Police, RN as Oncology Nurse Navigator    Name / MRN / DOB: Aaron Moore  [id number]  07/21/1965   Date of visit: 07/02/2022   Chief Complaint/Reason for visit: toxicity check   Current Therapy: cisplatin with concurrent RT  Last treatment:  Day 1   Cycle 4 on 06/25/22   ASSESSMENT & PLAN: Patient is a 57 y.o. male  with oncologic history of SCC tonsil followed by Dr. Chryl Heck.  I have viewed most recent oncology note and lab work.    #SCC tonsil - Toxicity check today for his treatment tomorrow. Labs were discussed with Dr. Chryl Heck and are not within parameters however he can proceed with treatment as scheduled. - Next appointment with oncologist is 07/09/22  #Pancytopenia - Related to treatment.  - CBC today showing WBC 3.9, HgB 9.9, platelets 95k with normal ANC. - No active bleeding. Dicussed bleeding and neutropenic precautions.  #Hypomagnesemia -Mild 1.6 today. Will receive IV replacement tomorrow per treatment plan.  #Cancer related pain - Patient with worsening throat pain secondary to radiation.  - Prescription sent to the pharmacy for Hycet. I have reviewed the PDMP during this encounter. No active prescriptions.  - If this helps control pain he can have refills from MD at future appointments.   #Mucositis - Grade 1. Will continue baking soda rinses. -Weight is down 2 pounds in 1 week. Discussed using G tube more frequently now that pain is prohibiting PO intake more. Sees dietician next on 07/10/22.    Heme/Onc History: Oncology History  Cancer of tonsillar fossa  05/08/2022 Initial Diagnosis   Cancer of tonsillar fossa ([place])   05/08/2022 Cancer  Staging   Staging form: Pharynx - HPV-Mediated Oropharynx, AJCC 8th Edition - Clinical stage from 05/08/2022: Stage II (cT3, cN1, cM0, p16+) - Signed by Eppie Gibson, MD on 05/08/2022 Stage prefix: Initial diagnosis   06/05/2022 -  Chemotherapy   Patient is on Treatment Plan : HEAD/NECK Cisplatin (40) q7d         Interval history-: Aaron Moore is a 57 y.o. male with oncologic history as above presenting to [facility] today with chief complaint of toxicity check.  Patient accompanied by spouse who provides additional history.  Patient states his last round of chemo was rough on him.  He feels fatigued with generalized weakness.  After his previous cycles he was able to continue his typical routine however noticed this week he needed more time to rest.  Patient also notes that he had an episode of ringing in his right ear yesterday with movement that resolved spontaneously.  Denies any ear pain, hearing loss or trauma. Patient is also reporting worsening constant pain in his throat that he describes as soreness. He rates pain 5/10 in severity. He thinks pain is worsening from radiation. He reports a harder time swallowing lately and needing to use his G Tube. In the last few days he has been doing to ensure per day through the G-tube.  He tolerates the feeds without difficulty.  Patient knows he will need to use the G-tube more frequently during the remainder of treatment. He denies any peripheral neuropathy. He does baking soda rinses multiple times daily He has a prescription for viscous lidocaine  however he could not tolerate the taste therefore does not use it. Denies fever, chills, constipation.    ROS  All other systems are reviewed and are negative for acute change except as noted in the HPI.    No Known Allergies   Past Medical History:  Diagnosis Date   Abdominal pain    Cancer ([place])    prostate cancer   Complication of anesthesia    slow to wake up 3 years ago after foot surgery   ED  (erectile dysfunction)    Gout    Gout    History of kidney stones    Hypertension    Obesity, Class II, BMI 35-39.9, with comorbidity    Painful orthopaedic hardware [facility])    left foot     Past Surgical History:  Procedure Laterality Date   amputation of 2 toes on left foot      CARPAL TUNNEL RELEASE  05/03/2011   Procedure: CARPAL TUNNEL RELEASE;  Surgeon: Wynonia Sours, MD;  Location: [place];  Service: Orthopedics;  Laterality: Left;   FOOT FRACTURE SURGERY Left    x3   HARDWARE REMOVAL Left 06/03/2019   Procedure: HARDWARE REMOVAL;  Surgeon: Erle Crocker, MD;  Location: [place];  Service: Orthopedics;  Laterality: Left;  PROCEDURE: LEFT FOOT REMOVAL OF HARDWARE, IRRIGATION AND DEBRIDEMENT,PLACEMENT OF CEMENT SPACER, DEBULKIN, SOFT TISSUE MASS LENGTH OF SURGERY: 2 HOURS   HERNIA REPAIR  02/06/2009   Lap supraumb & umb VWH repairs   INCISION AND DRAINAGE OF WOUND Left 06/03/2019   Procedure: IRRIGATION AND DEBRIDEMENT WOUND WITH IMPLANTATION OF ANTIBIOTIC CEMENT SPACER;  Surgeon: Erle Crocker, MD;  Location: [place];  Service: Orthopedics;  Laterality: Left;   IR GASTROSTOMY TUBE MOD SED  05/29/2022   IR IMAGING GUIDED PORT INSERTION  05/29/2022   left rotator cuff surgery      LYMPHADENECTOMY Bilateral 02/07/2022   Procedure: BILATERAL PELVIC LYMPHADENECTOMY;  Surgeon: Raynelle Bring, MD;  Location: [facility];  Service: Urology;  Laterality: Bilateral;   MASS EXCISION Left 06/03/2019   Procedure: DEBULKING LEFT FOOT MASS;  Surgeon: Erle Crocker, MD;  Location: [place];  Service: Orthopedics;  Laterality: Left;   ROBOT ASSISTED LAPAROSCOPIC RADICAL PROSTATECTOMY N/A 02/07/2022   Procedure: XI ROBOTIC ASSISTED LAPAROSCOPIC RADICAL PROSTATECTOMY LEVEL 3;  Surgeon: Raynelle Bring, MD;  Location: [facility];  Service: Urology;  Laterality: N/A;  210 MINUTES NEEDED FOR CASE    Social History    Socioeconomic History   Marital status: Married    Spouse name: Not on file   Number of children: Not on file   Years of education: Not on file   Highest education level: Not on file  Occupational History   Not on file  Tobacco Use   Smoking status: Never   Smokeless tobacco: Never  Vaping Use   Vaping Use: Never used  Substance and Sexual Activity   Alcohol use: No   Drug use: No   Sexual activity: Not on file  Other Topics Concern   Not on file  Social History Narrative   Not on file   Social Determinants of Health   Financial Resource Strain: Not on file  Food Insecurity: No Food Insecurity (02/07/2022)   Hunger Vital Sign    Worried About Running Out of Food in the Last Year: Never true    Ran Out of Food in the Last Year: Never true  Transportation Needs: No  Transportation Needs (02/07/2022)   PRAPARE - Hydrologist (Medical): No    Lack of Transportation (Non-Medical): No  Physical Activity: Not on file  Stress: Not on file  Social Connections: Not on file  Intimate Partner Violence: Not At Risk (02/07/2022)   Humiliation, Afraid, Rape, and Kick questionnaire    Fear of Current or Ex-Partner: No    Emotionally Abused: No    Physically Abused: No    Sexually Abused: No    Family History  Problem Relation Age of Onset   Diabetes Mother    Diabetes Father      Current Outpatient Medications:    HYDROcodone-acetaminophen (HYCET) 7.5-325 mg/15 ml solution, Take 15 mLs by mouth 4 (four) times daily as needed for moderate pain., Disp: 120 mL, Rfl: 0   amLODipine-valsartan (EXFORGE) 10-320 MG tablet, Take 1 tablet by mouth daily., Disp: , Rfl:    dexamethasone (DECADRON) 4 MG tablet, Take 2 tablets daily x 3 days starting the day after cisplatin chemotherapy. Take with food., Disp: 30 tablet, Rfl: 1   lidocaine (XYLOCAINE) 2 % solution, Patient: Mix 1part 2% viscous lidocaine, 1part H20. Swish & swallow 84mL of diluted mixture, 62min  before meals and at bedtime, up to QID, Disp: 200 mL, Rfl: 3   lidocaine-prilocaine (EMLA) cream, Apply to affected area once (Patient not taking: Reported on 06/19/2022), Disp: 30 g, Rfl: 3   ondansetron (ZOFRAN) 8 MG tablet, Take 1 tablet (8 mg total) by mouth every 8 (eight) hours as needed for nausea or vomiting. Start on the third day after cisplatin. (Patient not taking: Reported on 06/19/2022), Disp: 30 tablet, Rfl: 1   prochlorperazine (COMPAZINE) 10 MG tablet, Take 1 tablet (10 mg total) by mouth every 6 (six) hours as needed (Nausea or vomiting). (Patient not taking: Reported on 06/19/2022), Disp: 30 tablet, Rfl: 1   sucralfate (CARAFATE) 1 g tablet, Dissolve 1 tablet in 10 mL H20 and swish/swallow 30 min prior to meals and bedtime., Disp: 40 tablet, Rfl: 3  PHYSICAL EXAM: ECOG FS:1 - Symptomatic but completely ambulatory    Vitals:   07/02/22 0819  BP: 134/84  Pulse: 71  Resp: 14  Temp: 98.6 F (37 C)  TempSrc: Oral  SpO2: 100%  Weight: 260 lb 1.6 oz (118 kg)   Physical Exam Vitals and nursing note reviewed.  Constitutional:      Appearance: He is well-developed. He is not ill-appearing or toxic-appearing.  HENT:     Head: Normocephalic.     Nose: Nose normal.     Mouth/Throat:     Comments: Hard palate is erythematous  Eyes:     Conjunctiva/sclera: Conjunctivae normal.  Neck:     Vascular: No JVD.  Cardiovascular:     Rate and Rhythm: Normal rate and regular rhythm.     Pulses: Normal pulses.     Heart sounds: Normal heart sounds.  Pulmonary:     Effort: Pulmonary effort is normal.     Breath sounds: Normal breath sounds.  Chest:     Comments: PAC is right upper chest without surrounding erythema. Abdominal:     General: There is no distension.     Comments: G tube site well appearing. No drainage   Musculoskeletal:     Cervical back: Normal range of motion.  Skin:    General: Skin is warm and dry.  Neurological:     Mental Status: He is oriented to person,  place, and time.  LABORATORY DATA: I have reviewed the data as listed    Latest Ref Rng & Units 07/02/2022    7:58 AM 06/25/2022    8:52 AM 06/19/2022    8:15 AM  CBC  WBC 4.0 - 10.5 K/uL 3.9  4.6  9.4   Hemoglobin 13.0 - 17.0 g/dL 9.9  10.6  11.7   Hematocrit 39.0 - 52.0 % 28.7  30.6  34.9   Platelets 150 - 400 K/uL 95  118  150         Latest Ref Rng & Units 07/02/2022    7:58 AM 06/25/2022    8:52 AM 06/19/2022    8:15 AM  CMP  Glucose 70 - 99 mg/dL 94  102  157   BUN 6 - 20 mg/dL 15  16  29    Creatinine 0.61 - 1.24 mg/dL 0.95  0.96  1.12   Sodium 135 - 145 mmol/L 141  139  138   Potassium 3.5 - 5.1 mmol/L 3.6  3.5  3.5   Chloride 98 - 111 mmol/L 108  107  104   CO2 22 - 32 mmol/L 28  27  26    Calcium 8.9 - 10.3 mg/dL 8.4  8.6  8.5        RADIOGRAPHIC STUDIES (from last 24 hours if applicable) I have personally reviewed the radiological images as listed and agreed with the findings in the report. No results found.      Visit Diagnosis: 1. Cancer related pain   2. Cancer of tonsillar fossa   3. Other pancytopenia   4. Hypomagnesemia      No orders of the defined types were placed in this encounter.   All questions were answered. The patient knows to call the clinic with any problems, questions or concerns. No barriers to learning was detected.  I have spent a total of 30 minutes minutes of face-to-face and non-face-to-face time, preparing to see the patient, obtaining and/or reviewing separately obtained history, performing a medically appropriate examination, counseling and educating the patient, ordering tests, documenting clinical information in the electronic health record, and care coordination (communications with other health care professionals or caregivers).    Thank you for allowing me to participate in the care of this patient.    Barrie Folk, PA-C Department of Hematology/Oncology [facility] Phone: [phone]  [phone]    07/02/2022 11:17 AM

## 2022-07-03 ENCOUNTER — Inpatient Hospital Stay: Payer: 59 | Admitting: Dietician

## 2022-07-03 ENCOUNTER — Other Ambulatory Visit: Payer: Self-pay

## 2022-07-03 ENCOUNTER — Ambulatory Visit
Admission: RE | Admit: 2022-07-03 | Discharge: 2022-07-03 | Disposition: A | Discharge status: Home / Self Care | Payer: 59 | Source: Ambulatory Visit | Attending: Radiation Oncology | Admitting: Radiation Oncology

## 2022-07-03 ENCOUNTER — Inpatient Hospital Stay: Payer: 59

## 2022-07-03 VITALS — BP 139/86 | HR 61 | Temp 98.6°F | Resp 16

## 2022-07-03 DIAGNOSIS — C090 Malignant neoplasm of tonsillar fossa: Secondary | ICD-10-CM

## 2022-07-03 DIAGNOSIS — Z510 Encounter for antineoplastic radiation therapy: Secondary | ICD-10-CM | POA: Diagnosis not present

## 2022-07-03 LAB — RAD ONC ARIA SESSION SUMMARY
Course Elapsed Days: 30
Plan Fractions Treated to Date: 22
Plan Prescribed Dose Per Fraction: 2 Gy
Plan Total Fractions Prescribed: 35
Plan Total Prescribed Dose: 70 Gy
Reference Point Dosage Given to Date: 44 Gy
Reference Point Session Dosage Given: 2 Gy
Session Number: 22

## 2022-07-03 MED ORDER — MAGNESIUM SULFATE 2 GM/50ML IV SOLN
2.0000 g | Freq: Once | INTRAVENOUS | Status: DC
Start: 2022-07-03 — End: 2022-07-03

## 2022-07-03 MED ORDER — POTASSIUM CHLORIDE IN NACL 20-0.9 MEQ/L-% IV SOLN
Freq: Once | INTRAVENOUS | Status: AC
  Filled 2022-07-03: qty 1000

## 2022-07-03 MED ORDER — SODIUM CHLORIDE 0.9 % IV SOLN: Freq: Once | INTRAVENOUS | Status: AC

## 2022-07-03 MED ORDER — MAGNESIUM SULFATE 2 GM/50ML IV SOLN
2.0000 g | Freq: Once | INTRAVENOUS | Status: AC
  Administered 2022-07-03: 2 g via INTRAVENOUS
  Filled 2022-07-03: qty 50

## 2022-07-03 MED ORDER — SODIUM CHLORIDE 0.9 % IV SOLN
150.0000 mg | Freq: Once | INTRAVENOUS | Status: AC
  Administered 2022-07-03: 150 mg via INTRAVENOUS
  Filled 2022-07-03: qty 150

## 2022-07-03 MED ORDER — HEPARIN SOD (PORK) LOCK FLUSH 100 UNIT/ML IV SOLN
500.0000 [IU] | Freq: Once | INTRAVENOUS | Status: AC | PRN
  Administered 2022-07-03: 500 [IU]

## 2022-07-03 MED ORDER — SODIUM CHLORIDE 0.9 % IV SOLN
10.0000 mg | Freq: Once | INTRAVENOUS | Status: AC
  Administered 2022-07-03: 10 mg via INTRAVENOUS
  Filled 2022-07-03: qty 10

## 2022-07-03 MED ORDER — PALONOSETRON HCL INJECTION 0.25 MG/5ML
0.2500 mg | Freq: Once | INTRAVENOUS | Status: AC
  Administered 2022-07-03: 0.25 mg via INTRAVENOUS
  Filled 2022-07-03: qty 5

## 2022-07-03 MED ORDER — SODIUM CHLORIDE 0.9 % IV SOLN
40.0000 mg/m2 | Freq: Once | INTRAVENOUS | Status: AC
  Administered 2022-07-03: 100 mg via INTRAVENOUS
  Filled 2022-07-03: qty 100

## 2022-07-03 MED ORDER — SODIUM CHLORIDE 0.9% FLUSH
10.0000 mL | INTRAVENOUS | Status: DC | PRN
  Administered 2022-07-03: 10 mL

## 2022-07-03 NOTE — Progress Notes (Signed)
Mag today is 1.6 mg/d d/t decreased oral intake and concern for worsening oral intake this RN reached out to [place]. Per Dr. Chryl Heck OK to administer an extra dose of 2 g of magnesium with post hydration today. This RN released order for 2 g of mag from sign and held orders.

## 2022-07-03 NOTE — Patient Instructions (Addendum)
[facility]  Discharge Instructions: Thank you for choosing [facility] to provide your oncology and hematology care.   If you have a lab appointment with the Cancer Center, please go directly to the Cancer Center and check in at the registration area.   Wear comfortable clothing and clothing appropriate for easy access to any Portacath or PICC line.   We strive to give you quality time with your provider. You may need to reschedule your appointment if you arrive late (15 or more minutes).  Arriving late affects you and other patients whose appointments are after yours.  Also, if you miss three or more appointments without notifying the office, you may be dismissed from the clinic at the provider's discretion.      For prescription refill requests, have your pharmacy contact our office and allow 72 hours for refills to be completed.    Today you received the following chemotherapy and/or immunotherapy agents Cisplatin      To help prevent nausea and vomiting after your treatment, we encourage you to take your nausea medication as directed.  BELOW ARE SYMPTOMS THAT SHOULD BE REPORTED IMMEDIATELY: *FEVER GREATER THAN 100.4 F (38 C) OR HIGHER *CHILLS OR SWEATING *NAUSEA AND VOMITING THAT IS NOT CONTROLLED WITH YOUR NAUSEA MEDICATION *UNUSUAL SHORTNESS OF BREATH *UNUSUAL BRUISING OR BLEEDING *URINARY PROBLEMS (pain or burning when urinating, or frequent urination) *BOWEL PROBLEMS (unusual diarrhea, constipation, pain near the anus) TENDERNESS IN MOUTH AND THROAT WITH OR WITHOUT PRESENCE OF ULCERS (sore throat, sores in mouth, or a toothache) UNUSUAL RASH, SWELLING OR PAIN  UNUSUAL VAGINAL DISCHARGE OR ITCHING   Items with * indicate a potential emergency and should be followed up as soon as possible or go to the Emergency Department if any problems should occur.  Please show the CHEMOTHERAPY ALERT CARD or IMMUNOTHERAPY ALERT CARD at  check-in to the Emergency Department and triage nurse.  Should you have questions after your visit or need to cancel or reschedule your appointment, please contact [facility]  Dept: [phone]  and follow the prompts.  Office hours are 8:00 a.m. to 4:30 p.m. Monday - Friday. Please note that voicemails left after 4:00 p.m. may not be returned until the following business day.  We are closed weekends and major holidays. You have access to a nurse at all times for urgent questions. Please call the main number to the clinic Dept: [phone] and follow the prompts.   For any non-urgent questions, you may also contact your provider using MyChart. We now offer e-Visits for anyone 18 and older to request care online for non-urgent symptoms. For details visit [email or web address].   Also download the MyChart app! Go to the app store, search "MyChart", open the app, select , and log in with your MyChart username and password.   

## 2022-07-03 NOTE — Progress Notes (Signed)
Nutrition Follow-up:  Patient with SCC of right tonsil, P16 positive. He is receiving concurrent chemoradiation. S/p PEG   Met with patient in infusion. He reports increased weakness and fatigue since last infusion. Patient states he has been napping which is not usual for him. Patient has increased saliva, especially in the mornings. This is hard for him to spit out. He is doing baking soda salt water rinses. Patient continues trying to eat orally despite altered taste and sore throat. Patient recalls getting creative with foods to make them better. 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 Recalls eating spaghetti with meat sauce, grilled shrimp in [place] sauce and snacking on olives. Patient drinks 2 cups whole milk with dinner. Patient states he is drinking 2 Ensure Plus as well as 3-4 bottles of water orally. He states he started using the feeding tube a couple of days ago. Patient giving one carton Osmolite 1.5 twice daily. He is using one 16 ounce bottle of water split between feedings. Patient agreeable to try bolus of [employer] 1.4 during infusion today.   Medications: reviewed   Labs: Mg 1.6  Anthropometrics: Wt 260 lb 1.6 oz on 4/2 decreased   3/26 - 262 lb 11.2 oz  3/20 - 268 lb  3/11 - 277 lb 9.6 oz  Estimated Energy Needs  Kcals: 2950-3300 Protein: 140-165 Fluid: >/= 3L  NUTRITION DIAGNOSIS: Unintended weight loss continues - pt supplementing with TF   INTERVENTION:  Encouraged pt to continue eating soft moist textures orally as tolerated Continue drinking 2 Ensure Plus HP + 16 ounces whole milk by mouth (998 kcal, 56 g) Pt agreeable to one carton [employer] 1.4 twice daily. Flush tube with 90 ml water before and after each feeding (910 kcal, 40 g protein, 468 ml free water) 828 ml total water with flush One case [employer] 1.4 provided  Pt reports understanding that he needs to consume additional ~1050 kcal, 50 grams  protein orally to meet nutrition needs  Continue baking soda salt water rinses several times daily Suggested sprite/gingerale rinse for thick saliva - pt tried this during infusion and it worked well    MONITORING, EVALUATION, GOAL: weight trends, intake, TF   NEXT VISIT: Wednesday April 10 during infusion

## 2022-07-04 ENCOUNTER — Ambulatory Visit: Payer: 59 | Attending: Radiation Oncology

## 2022-07-04 ENCOUNTER — Other Ambulatory Visit: Payer: Self-pay

## 2022-07-04 ENCOUNTER — Ambulatory Visit
Admission: RE | Admit: 2022-07-04 | Discharge: 2022-07-04 | Disposition: A | Discharge status: Home / Self Care | Payer: 59 | Source: Ambulatory Visit | Attending: Radiation Oncology | Admitting: Radiation Oncology

## 2022-07-04 DIAGNOSIS — R1310 Dysphagia, unspecified: Secondary | ICD-10-CM

## 2022-07-04 DIAGNOSIS — Z510 Encounter for antineoplastic radiation therapy: Secondary | ICD-10-CM | POA: Diagnosis not present

## 2022-07-04 LAB — RAD ONC ARIA SESSION SUMMARY
Course Elapsed Days: 31
Plan Fractions Treated to Date: 23
Plan Prescribed Dose Per Fraction: 2 Gy
Plan Total Fractions Prescribed: 35
Plan Total Prescribed Dose: 70 Gy
Reference Point Dosage Given to Date: 46 Gy
Reference Point Session Dosage Given: 2 Gy
Session Number: 23

## 2022-07-04 NOTE — Therapy (Signed)
OUTPATIENT SPEECH LANGUAGE PATHOLOGY TREATMENT   Patient Name: Aaron Moore MRN: [id number] DOB:20-Jan-1966, 57 y.o., male Today's Date: 07/04/2022  PCP: Janie Morning REFERRING PROVIDER: Eppie Gibson, MD  END OF SESSION:  End of Session - 07/04/22 0821     Visit Number 2    Number of Visits 7    Date for SLP Re-Evaluation 09/04/22    SLP Start Time 0820    SLP Stop Time  0850    SLP Time Calculation (min) 30 min    Activity Tolerance Patient tolerated treatment well             Past Medical History:  Diagnosis Date   Abdominal pain    Cancer    prostate cancer   Complication of anesthesia    slow to wake up 3 years ago after foot surgery   ED (erectile dysfunction)    Gout    Gout    History of kidney stones    Hypertension    Obesity, Class II, BMI 35-39.9, with comorbidity    Painful orthopaedic hardware    left foot   Past Surgical History:  Procedure Laterality Date   amputation of 2 toes on left foot      CARPAL TUNNEL RELEASE  05/03/2011   Procedure: CARPAL TUNNEL RELEASE;  Surgeon: Wynonia Sours, MD;  Location: [place];  Service: Orthopedics;  Laterality: Left;   FOOT FRACTURE SURGERY Left    x3   HARDWARE REMOVAL Left 06/03/2019   Procedure: HARDWARE REMOVAL;  Surgeon: Erle Crocker, MD;  Location: [place];  Service: Orthopedics;  Laterality: Left;  PROCEDURE: LEFT FOOT REMOVAL OF HARDWARE, IRRIGATION AND DEBRIDEMENT,PLACEMENT OF CEMENT SPACER, DEBULKIN, SOFT TISSUE MASS LENGTH OF SURGERY: 2 HOURS   HERNIA REPAIR  02/06/2009   Lap supraumb & umb VWH repairs   INCISION AND DRAINAGE OF WOUND Left 06/03/2019   Procedure: IRRIGATION AND DEBRIDEMENT WOUND WITH IMPLANTATION OF ANTIBIOTIC CEMENT SPACER;  Surgeon: Erle Crocker, MD;  Location: [place];  Service: Orthopedics;  Laterality: Left;   IR GASTROSTOMY TUBE MOD SED  05/29/2022   IR IMAGING GUIDED PORT INSERTION  05/29/2022   left  rotator cuff surgery      LYMPHADENECTOMY Bilateral 02/07/2022   Procedure: BILATERAL PELVIC LYMPHADENECTOMY;  Surgeon: Raynelle Bring, MD;  Location: [facility];  Service: Urology;  Laterality: Bilateral;   MASS EXCISION Left 06/03/2019   Procedure: DEBULKING LEFT FOOT MASS;  Surgeon: Erle Crocker, MD;  Location: [place];  Service: Orthopedics;  Laterality: Left;   ROBOT ASSISTED LAPAROSCOPIC RADICAL PROSTATECTOMY N/A 02/07/2022   Procedure: XI ROBOTIC ASSISTED LAPAROSCOPIC RADICAL PROSTATECTOMY LEVEL 3;  Surgeon: Raynelle Bring, MD;  Location: [facility];  Service: Urology;  Laterality: N/A;  [place] CASE   Patient Active Problem List   Diagnosis Date Noted   Port-A-Cath in place 06/04/2022   Cancer of tonsillar fossa 05/08/2022   Prostate cancer 02/07/2022   Medial epicondylitis of left elbow 03/08/2015   Obesity, Class II, BMI 35-39.9, with comorbidity 11/28/2010   Abdominal wall mass of epigastric region - old hernia sac/lipoma 11/28/2010   Abdominal pain, RLQ - muscle strain 11/28/2010    REFERRING DIAG: C09.0 (ICD-10-CM) - Cancer of tonsillar fossa   THERAPY DIAG:  Dysphagia, unspecified type  Rationale for Evaluation and Treatment: Rehabilitation  SUBJECTIVE:   SUBJECTIVE STATEMENT: Flat affect today. "Taste is gone"  Pt accompanied by: self  PERTINENT HISTORY:  SCC  of his right tonsil, Stage II (T3, N1, M0, p 16 +).H e presented to Dr. Stanford [facility]) on 04/26/22 with c/o of worsening right sided sore throat over the course of several months. The soreness had remained stable but increased in severity several weeks prior to this visit. He also reported some voice changes and odynophagia but denied dysphagia, weight loss, or history of smoking. Laryngoscopy performed revealed an enlarged right tonsil with significant mass effect in the oropharynx/nasopharynx. Bilateral true vocal cord fullness was also appreciated. 04/26/22 Biopsy  completed same day as ENT visit showed small fragments of squamous mucosa showing high-grade dysplasia/SCC in situ, p 16 +. 05/04/22 CT neck revealed he mass within the right oropharynx in the region of the right palatine tonsil as compatible with malignancy, measuring 3.7 x 3.4 cm. CT also showed one single enlarged right cervical chain level 2 lymph node measuring 2.0 x 1.2 cm. 05/04/22 PET demonstrated the right palatine tonsillar mass as FDG avid (SUV max 16.8), measuring 3.5 x 3.5 cm. PET also showed the right level 2 node concerning for nodal metastasis as FDG avid (SUV max 3.8). 2/7/ Consult with Dr. Isidore Moos & Dr. Chryl Heck. (He will receive chemotherapy and radiation). Treatment plan: to receive 35 fractions of radiation to his tonsil and bilateral neck with weekly cisplatin. He started on 06/03/22 and will complete 07/19/22. PEG/PAC placed on 05/29/22  PAIN:  Are you having pain? Yes: NPRS scale: 3/10 Pain location: mid section Pain description: sore Aggravating factors: swallowing Relieving factors: not swallowing    PATIENT GOALS: Maintain normal swallowing  OBJECTIVE:    TODAY'S TREATMENT:                                                                                                                                         DATE:  07/04/22: Pt still eating solid foods and is starting to use tube. Ate mashed potatoes, country steak, and other soft foods like macaroni and cheese in the last 72 hours. Is doing HEP but not at recommended frequency or scope. Today required reading the HEP sheet to complete exercises. Pt performed HEP with occasional min-mod A with SLP demo, faded to modified independent. SLP stressed completion of HEP 6/7 days a week, minimally, with 20 reps, minimally, each day. SLP stressed caveat of if pt cannot swallow in the next 3-4 weeks he should complete what he feels he can of EACH exercise daily, increasing reps as he is able to do so. He ate applesauce and drank water without  any overt s/sx oral or pharyngeal deficits.  He told SLP rationale for HEP independently.   06/06/22 (eval): Research states the risk for dysphagia increases due to radiation and/or chemotherapy treatment due to a variety of factors, so SLP educated the pt about the possibility of reduced/limited ability for PO intake during rad tx. SLP also educated pt regarding possible changes to swallowing  musculature after rad tx, and why adherence to dysphagia HEP provided today and PO consumption was necessary to inhibit muscle fibrosis following rad tx and to mitigate muscle disuse atrophy. SLP informed pt why this would be detrimental to their swallowing status and to their pulmonary health. Pt demonstrated understanding of these things to SLP. SLP encouraged pt to safely eat and drink as deep into their radiation/chemotherapy as possible to provide the best possible long-term swallowing outcome for pt.  SLP then developed an individualized HEP for pt involving oral and pharyngeal strengthening and ROM and pt was instructed how to perform these exercises, including SLP demonstration. After SLP demonstration, pt return demonstrated each exercise. SLP ensured pt performance was correct prior to educating pt on next exercise. Pt required occasional min-mod cues faded to modified independent to perform HEP. Pt was instructed to complete this program at least 6 days/week, at least 2 times a day until 6 months after his last day of rad tx and then x2 a week after that, indefinitely. Among other modifications for days when pt cannot functionally swallow, SLP also suggested pt  cycle through the swallowing portion so the full program of exercises can be completed instead of fatiguing on one of the swallowing exercises and being unable to perform the other swallowing exercises. SLP instructed that swallowing exercise reps should then be increased back into the regimen as pt is able to do so.     PATIENT EDUCATION: Education  details: late effects head/neck radiation on swallow function, HEP procedure, and modification to HEP when difficulty experienced with swallowing during and after radiation course Person educated: Patient Education method: Consulting civil engineer, Demonstration, Verbal cues, and Handouts Education comprehension: verbalized understanding, returned demonstration, verbal cues required, and needs further education     ASSESSMENT:   CLINICAL IMPRESSION: Patient is a 57 y.o. male who was seen today for treatment of swallowing as they undergo chemoradiation therapy. Today pt ate applesauce and drank thin liquids without overt s/s oral or pharyngeal difficulty. At this time pt swallowing is deemed WNL/WFL with these POs. There are no overt s/s aspiration PNA observed by SLP nor any reported by pt at this time. Data indicate that pt's swallow ability will likely decrease over the course of radiation/chemoradiation therapy and could very well decline over time following the conclusion of that therapy due to muscle disuse atrophy and/or muscle fibrosis. Pt will cont to need to be seen by SLP in order to assess safety of PO intake, assess the need for recommending any objective swallow assessment, and ensuring pt is correctly completing the individualized HEP. If pt cont to complain of "sinus drainage" after POs, a modified barium swallow eval may be necessary.   OBJECTIVE IMPAIRMENTS: include dysphagia. These impairments are limiting patient from safety when swallowing. Factors affecting potential to achieve goals and functional outcome are  none noted today . Patient will benefit from skilled SLP services to address above impairments and improve overall function.   REHAB POTENTIAL: Good     GOALS: Goals reviewed with patient? Yes   SHORT TERM GOALS: Target: 3rd total session   Pt will compelte HEP with modified independence in 2 sessions Baseline: Goal status: Ongoing   2.  pt will tell SLP why pt is completing  HEP with modified independence Baseline:  Goal status: Met   3.  pt will describe 3 overt s/s aspiration PNA with modified independence Baseline:  Goal status: Ongoing   4.  pt will tell SLP how a food  journal could hasten return to a more normalized diet Baseline:  Goal status: Ongoing     LONG TERM GOALS: Target: 7th total session   1.  pt will complete HEP with independence over two visits Baseline:  Goal status: Ongoing   2.  pt will describe how to modify HEP over time, and the timeline associated with reduction in HEP frequency with modified independence over two sessions Baseline:  Goal status: Ongoing   PLAN:   SLP FREQUENCY:  once approx every 4 weeks   SLP DURATION:  6 sessions   PLANNED INTERVENTIONS: Aspiration precaution training, Pharyngeal strengthening exercises, Diet toleration management , Environmental controls, Trials of upgraded texture/liquids, Cueing hierachy, Internal/external aids, SLP instruction and feedback, Compensatory strategies, and Patient/family education   [facility], [place] 07/04/2022, 8:21 AM

## 2022-07-05 ENCOUNTER — Other Ambulatory Visit: Payer: Self-pay

## 2022-07-05 ENCOUNTER — Ambulatory Visit
Admission: RE | Admit: 2022-07-05 | Discharge: 2022-07-05 | Disposition: A | Discharge status: Home / Self Care | Payer: 59 | Source: Ambulatory Visit | Attending: Radiation Oncology | Admitting: Radiation Oncology

## 2022-07-05 DIAGNOSIS — Z510 Encounter for antineoplastic radiation therapy: Secondary | ICD-10-CM | POA: Diagnosis not present

## 2022-07-05 LAB — RAD ONC ARIA SESSION SUMMARY
Course Elapsed Days: 32
Plan Fractions Treated to Date: 24
Plan Prescribed Dose Per Fraction: 2 Gy
Plan Total Fractions Prescribed: 35
Plan Total Prescribed Dose: 70 Gy
Reference Point Dosage Given to Date: 48 Gy
Reference Point Session Dosage Given: 2 Gy
Session Number: 24

## 2022-07-08 ENCOUNTER — Ambulatory Visit
Admission: RE | Admit: 2022-07-08 | Discharge: 2022-07-08 | Disposition: A | Discharge status: Home / Self Care | Payer: 59 | Source: Ambulatory Visit | Attending: Radiation Oncology | Admitting: Radiation Oncology

## 2022-07-08 ENCOUNTER — Other Ambulatory Visit: Payer: Self-pay

## 2022-07-08 DIAGNOSIS — Z510 Encounter for antineoplastic radiation therapy: Secondary | ICD-10-CM | POA: Diagnosis not present

## 2022-07-08 LAB — RAD ONC ARIA SESSION SUMMARY
Course Elapsed Days: 35
Plan Fractions Treated to Date: 25
Plan Prescribed Dose Per Fraction: 2 Gy
Plan Total Fractions Prescribed: 35
Plan Total Prescribed Dose: 70 Gy
Reference Point Dosage Given to Date: 50 Gy
Reference Point Session Dosage Given: 2 Gy
Session Number: 25

## 2022-07-09 ENCOUNTER — Encounter: Payer: Self-pay | Admitting: Hematology and Oncology

## 2022-07-09 ENCOUNTER — Other Ambulatory Visit: Payer: Self-pay

## 2022-07-09 ENCOUNTER — Ambulatory Visit
Admission: RE | Admit: 2022-07-09 | Discharge: 2022-07-09 | Disposition: A | Discharge status: Home / Self Care | Payer: 59 | Source: Ambulatory Visit | Attending: Radiation Oncology | Admitting: Radiation Oncology

## 2022-07-09 ENCOUNTER — Other Ambulatory Visit (HOSPITAL_COMMUNITY): Payer: Self-pay

## 2022-07-09 ENCOUNTER — Inpatient Hospital Stay (HOSPITAL_BASED_OUTPATIENT_CLINIC_OR_DEPARTMENT_OTHER): Payer: 59 | Admitting: Physician Assistant

## 2022-07-09 ENCOUNTER — Inpatient Hospital Stay: Payer: 59

## 2022-07-09 VITALS — BP 124/69 | HR 63 | Temp 98.7°F | Resp 13 | Wt 250.6 lb

## 2022-07-09 DIAGNOSIS — G893 Neoplasm related pain (acute) (chronic): Secondary | ICD-10-CM | POA: Diagnosis not present

## 2022-07-09 DIAGNOSIS — C090 Malignant neoplasm of tonsillar fossa: Secondary | ICD-10-CM

## 2022-07-09 DIAGNOSIS — Z95828 Presence of other vascular implants and grafts: Secondary | ICD-10-CM

## 2022-07-09 DIAGNOSIS — K1230 Oral mucositis (ulcerative), unspecified: Secondary | ICD-10-CM | POA: Diagnosis not present

## 2022-07-09 DIAGNOSIS — Z510 Encounter for antineoplastic radiation therapy: Secondary | ICD-10-CM | POA: Diagnosis not present

## 2022-07-09 LAB — BASIC METABOLIC PANEL - CANCER CENTER ONLY
Anion gap: 6 (ref 5–15)
BUN: 19 mg/dL (ref 6–20)
CO2: 28 mmol/L (ref 22–32)
Calcium: 8.8 mg/dL — ABNORMAL LOW (ref 8.9–10.3)
Chloride: 106 mmol/L (ref 98–111)
Creatinine: 1.04 mg/dL (ref 0.61–1.24)
GFR, Estimated: >60 mL/min (ref >60)
Glucose, Bld: 93 mg/dL (ref 70–99)
Potassium: 3.6 mmol/L (ref 3.5–5.1)
Sodium: 140 mmol/L (ref 135–145)

## 2022-07-09 LAB — RAD ONC ARIA SESSION SUMMARY
Course Elapsed Days: 36
Plan Fractions Treated to Date: 26
Plan Prescribed Dose Per Fraction: 2 Gy
Plan Total Fractions Prescribed: 35
Plan Total Prescribed Dose: 70 Gy
Reference Point Dosage Given to Date: 52 Gy
Reference Point Session Dosage Given: 2 Gy
Session Number: 26

## 2022-07-09 LAB — CBC WITH DIFFERENTIAL (CANCER CENTER ONLY)
Abs Immature Granulocytes: 0.01 10*3/uL (ref 0.00–0.07)
Basophils Absolute: 0 10*3/uL (ref 0.0–0.1)
Basophils Relative: 0 %
Eosinophils Absolute: 0 10*3/uL (ref 0.0–0.5)
Eosinophils Relative: 0 %
HCT: 28.8 % — ABNORMAL LOW (ref 39.0–52.0)
Hemoglobin: 10.1 g/dL — ABNORMAL LOW (ref 13.0–17.0)
Immature Granulocytes: 0 %
Lymphocytes Relative: 7 %
Lymphs Abs: 0.2 10*3/uL — ABNORMAL LOW (ref 0.7–4.0)
MCH: 29.4 pg (ref 26.0–34.0)
MCHC: 35.1 g/dL (ref 30.0–36.0)
MCV: 83.7 fL (ref 80.0–100.0)
Monocytes Absolute: 0.4 10*3/uL (ref 0.1–1.0)
Monocytes Relative: 16 %
Neutro Abs: 2 10*3/uL (ref 1.7–7.7)
Neutrophils Relative %: 77 %
Platelet Count: 164 10*3/uL (ref 150–400)
RBC: 3.44 MIL/uL — ABNORMAL LOW (ref 4.22–5.81)
RDW: 16.5 % — ABNORMAL HIGH (ref 11.5–15.5)
WBC Count: 2.6 10*3/uL — ABNORMAL LOW (ref 4.0–10.5)
nRBC: 0 % (ref 0.0–0.2)

## 2022-07-09 LAB — MAGNESIUM: Magnesium: 1.6 mg/dL — ABNORMAL LOW (ref 1.7–2.4)

## 2022-07-09 MED ORDER — SUCRALFATE 1 GM/10ML PO SUSP
1.0000 g | Freq: Three times a day (TID) | ORAL | 0 refills | Status: DC
  Filled 2022-07-09 – 2022-07-11 (×2): qty 420, 11d supply, fill #0

## 2022-07-09 MED ORDER — HEPARIN SOD (PORK) LOCK FLUSH 100 UNIT/ML IV SOLN
500.0000 [IU] | Freq: Once | INTRAVENOUS | Status: AC
  Administered 2022-07-09: 500 [IU]

## 2022-07-09 MED ORDER — HYDROCODONE-ACETAMINOPHEN 7.5-325 MG/15ML PO SOLN
15.0000 mL | Freq: Four times a day (QID) | ORAL | 0 refills | Status: AC | PRN
  Filled 2022-07-09: qty 120, 2d supply, fill #0
  Filled 2022-07-11: qty 120, 3d supply, fill #0
  Filled 2022-07-11: qty 120, 2d supply, fill #0

## 2022-07-09 MED ORDER — SODIUM CHLORIDE 0.9% FLUSH
10.0000 mL | Freq: Once | INTRAVENOUS | Status: AC
  Administered 2022-07-09: 10 mL

## 2022-07-09 MED FILL — Fosaprepitant Dimeglumine For IV Infusion 150 MG (Base Eq): INTRAVENOUS | Qty: 5 | Status: AC

## 2022-07-09 MED FILL — Dexamethasone Sodium Phosphate Inj 100 MG/10ML: INTRAMUSCULAR | Qty: 1 | Status: AC

## 2022-07-09 NOTE — Progress Notes (Signed)
[facility]    Patient Care Team: Irena Reichmann, DO as PCP - General (Family Medicine) Jens Som, Arnetha Massy, MD as Referring Physician (Otolaryngology) Lonie Peak, MD as Consulting Physician (Radiation Oncology) Rachel Moulds, MD as Consulting Physician (Hematology and Oncology) Malmfelt, Lise Auer, RN as Oncology Nurse Navigator   Date of visit: 07/09/2022   Chief Complaint: SCC of Moore  Current Therapy: cisplatin with concurrent RT  Heme/Onc History: Oncology History  Cancer of tonsillar fossa  05/08/2022 Initial Diagnosis   Cancer of tonsillar fossa (HCC)   05/08/2022 Cancer Staging   Staging form: Pharynx - HPV-Mediated Oropharynx, AJCC 8th Edition - Clinical stage from 05/08/2022: Stage II (cT3, cN1, cM0, p16+) - Signed by Lonie Peak, MD on 05/08/2022 Stage prefix: Initial diagnosis   06/05/2022 -  Chemotherapy   Patient is on Treatment Plan : HEAD/NECK Cisplatin (40) q7d         Interval history-: Aaron Moore is a 57 y.o. male returns for a follow up for Aaron Moore prior to Cycle 6 of weekly cisplatin given with concurrent RT.  Patient accompanied by wife for this visit.    Patient reports he is struggling with treatment for the last week. He reports progressive fatigue with treatment but continues to complete his ADLs including mowing the lawn. He reports worsening mucositis which interferes with his PO intake. He is only able to tolerate liquids such as soups and protein shakes. He is compliant with drinking two protein shakes by mouth and two cartons of [employer] twice daily via PEG. He has lost 10 lbs since the last visit. He reports the lidocaine solution makes him want to throw up. He does have some relief of pain with liquid Hycet that was given last week. He denies any odynophagia or regurgitation. He denies nausea, vomiting or abdominal pain. His bowel habits are unchanged without recurrent episodes of diarrhea or constipation. He  denies easy bruising or signs of active bleeding. He denies fevers, chills, sweats, shortness of breath, chest pain or cough. He has no other complaints. Rest of the 10 point ROS is below.    No Known Allergies  Past Medical History:  Diagnosis Date   Abdominal pain    Cancer    prostate cancer   Complication of anesthesia    slow to wake up 3 years ago after foot surgery   ED (erectile dysfunction)    Gout    Gout    History of kidney stones    Hypertension    Obesity, Class II, BMI 35-39.9, with comorbidity    Painful orthopaedic hardware    left foot     Past Surgical History:  Procedure Laterality Date   amputation of 2 toes on left foot      CARPAL TUNNEL RELEASE  05/03/2011   Procedure: CARPAL TUNNEL RELEASE;  Surgeon: Nicki Reaper, MD;  Location: [facility];  Service: Orthopedics;  Laterality: Left;   FOOT FRACTURE SURGERY Left    x3   HARDWARE REMOVAL Left 06/03/2019   Procedure: HARDWARE REMOVAL;  Surgeon: Terance Hart, MD;  Location: [facility];  Service: Orthopedics;  Laterality: Left;  PROCEDURE: LEFT FOOT REMOVAL OF HARDWARE, IRRIGATION AND DEBRIDEMENT,PLACEMENT OF CEMENT SPACER, DEBULKIN, SOFT TISSUE MASS LENGTH OF SURGERY: 2 HOURS   HERNIA REPAIR  02/06/2009   Lap supraumb & umb VWH repairs   INCISION AND DRAINAGE OF WOUND Left 06/03/2019   Procedure: IRRIGATION AND DEBRIDEMENT WOUND  WITH IMPLANTATION OF ANTIBIOTIC CEMENT SPACER;  Surgeon: Terance Hart, MD;  Location: [facility];  Service: Orthopedics;  Laterality: Left;   IR GASTROSTOMY TUBE MOD SED  05/29/2022   IR IMAGING GUIDED PORT INSERTION  05/29/2022   left rotator cuff surgery      LYMPHADENECTOMY Bilateral 02/07/2022   Procedure: BILATERAL PELVIC LYMPHADENECTOMY;  Surgeon: Heloise Purpura, MD;  Location: [facility];  Service: Urology;  Laterality: Bilateral;   MASS EXCISION Left 06/03/2019   Procedure: DEBULKING LEFT FOOT MASS;  Surgeon: Terance Hart, MD;  [facility];  Service: Orthopedics;  Laterality: Left;   ROBOT ASSISTED LAPAROSCOPIC RADICAL PROSTATECTOMY N/A 02/07/2022   Procedure: XI ROBOTIC ASSISTED LAPAROSCOPIC RADICAL PROSTATECTOMY LEVEL 3;  Surgeon: Heloise Purpura, MD;  Location: [facility];  Service: Urology;  Laterality: N/A;  210 MINUTES NEEDED FOR CASE    Social History   Socioeconomic History   Marital status: Married    Spouse name: Not on file   Number of children: Not on file   Years of education: Not on file   Highest education level: Not on file  Occupational History   Not on file  Tobacco Use   Smoking status: Never   Smokeless tobacco: Never  Vaping Use   Vaping Use: Never used  Substance and Sexual Activity   Alcohol use: No   Drug use: No   Sexual activity: Not on file  Other Topics Concern   Not on file  Social History Narrative   Not on file   Social Determinants of Health   Financial Resource Strain: Not on file  Food Insecurity: No Food Insecurity (02/07/2022)   Hunger Vital Sign    Worried About Running Out of Food in the Last Year: Never true    Ran Out of Food in the Last Year: Never true  Transportation Needs: No Transportation Needs (02/07/2022)   PRAPARE - Administrator, Civil Service (Medical): No    Lack of Transportation (Non-Medical): No  Physical Activity: Not on file  Stress: Not on file  Social Connections: Not on file  Intimate Partner Violence: Not At Risk (02/07/2022)   Humiliation, Afraid, Rape, and Kick questionnaire    Fear of Current or Ex-Partner: No    Emotionally Abused: No    Physically Abused: No    Sexually Abused: No    Family History  Problem Relation Age of Onset   Diabetes Mother    Diabetes Father      Current Outpatient Medications:    amLODipine-valsartan (EXFORGE) 10-320 MG tablet, Take 1 tablet by mouth daily., Disp: , Rfl:    dexamethasone (DECADRON) 4 MG tablet, Take 2 tablets daily x 3 days  starting the day after cisplatin chemotherapy. Take with food., Disp: 30 tablet, Rfl: 1   lidocaine (XYLOCAINE) 2 % solution, Patient: Mix 1part 2% viscous lidocaine, 1part H20. Swish & swallow 84mL of diluted mixture, before meals and at bedtime, up to QID, Disp: 200 mL, Rfl: 3   sucralfate (CARAFATE) 1 GM/10ML suspension, Take 10 mLs (1 g total) by mouth 4 (four) times daily -  with meals and at bedtime., Disp: 420 mL, Rfl: 0   HYDROcodone-acetaminophen (HYCET) 7.5-325 mg/15 ml solution, Take 15 mLs by mouth 4 (four) times daily as needed for up to 14 days for moderate pain., Disp: 120 mL, Rfl: 0   lidocaine-prilocaine (EMLA) cream, Apply to affected area once (Patient not taking: Reported on 06/19/2022), Disp: 30  g, Rfl: 3   ondansetron (ZOFRAN) 8 MG tablet, Take 1 tablet (8 mg total) by mouth every 8 (eight) hours as needed for nausea or vomiting. Start on the third day after cisplatin. (Patient not taking: Reported on 06/19/2022), Disp: 30 tablet, Rfl: 1   prochlorperazine (COMPAZINE) 10 MG tablet, Take 1 tablet (10 mg total) by mouth every 6 (six) hours as needed (Nausea or vomiting). (Patient not taking: Reported on 06/19/2022), Disp: 30 tablet, Rfl: 1   REVIEW OF SYSTEMS:   Constitutional: Negative for chills, fever. +Appetite loss, fatigue, weight loss HENT: Negative for nosebleeds. +Mucositis, trouble swallowing   Eyes: Negative for eye problems and icterus.  Respiratory: Negative for cough, hemoptysis, shortness of breath and wheezing.   Cardiovascular: Negative for chest pain and leg swelling.  Gastrointestinal: Negative for abdominal pain, constipation, diarrhea, nausea and vomiting.  Genitourinary: Negative for bladder incontinence, difficulty urinating, dysuria, frequency and hematuria.   Musculoskeletal: Negative for back pain, gait problem, neck pain and neck stiffness.  Skin:Negative for rash and ulcers Neurological: Negative for dizziness, extremity weakness, gait problem,  headaches, light-headedness and seizures.  Hematological: Negative for adenopathy. Does not bruise/bleed easily.  Psychiatric/Behavioral: Negative for confusion, depression and sleep disturbance. The patient is not nervous/anxious.    PHYSICAL EXAM: ECOG FS:1 - Symptomatic but completely ambulatory    Vitals:   07/09/22 0904  BP: 124/69  Pulse: 63  Resp: 13  Temp: 98.7 F (37.1 C)  TempSrc: Oral  SpO2: 97%  Weight: 250 lb 9.6 oz (113.7 kg)      Constitutional: Oriented to person, place, and time and well-developed, well-nourished, and in no distress.  HENT:  Head: Normocephalic and atraumatic.  Eyes: Conjunctivae are normal. Right eye exhibits no discharge. Left eye exhibits no discharge. No scleral icterus.  Neck: Normal range of motion. Neck supple.   Oral Cavity: erythema in hard palate, no ulcerations or bleeding. Cardiovascular: Normal rate, regular rhythm, normal heart sounds   Pulmonary/Chest: Effort normal and breath sounds normal. No respiratory distress. No wheezes. No rales.  Musculoskeletal: Normal range of motion. Exhibits no edema.  Lymphadenopathy: No cervical adenopathy.  Neurological: Alert and oriented to person, place, and time. Exhibits normal muscle tone. Gait normal. Coordination normal.  Skin: Skin is warm and dry. No rash noted. Not diaphoretic. No erythema. No pallor.  Psychiatric: Mood, memory and judgment normal.    LABORATORY DATA: I have reviewed the data as listed    Latest Ref Rng & Units 07/09/2022    8:38 AM 07/02/2022    7:58 AM 06/25/2022    8:52 AM  CBC  WBC 4.0 - 10.5 K/uL 2.6  3.9  4.6   Hemoglobin 13.0 - 17.0 g/dL 16.1  9.9  09.6   Hematocrit 39.0 - 52.0 % 28.8  28.7  30.6   Platelets 150 - 400 K/uL 164  95  118         Latest Ref Rng & Units 07/09/2022    8:38 AM 07/02/2022    7:58 AM 06/25/2022    8:52 AM  CMP  Glucose 70 - 99 mg/dL 93  94  045   BUN 6 - 20 mg/dL Creatinine 0.61 - 1.24 mg/dL 4.09  8.11  9.14    Sodium 135 - 145 mmol/L 140  141  139   Potassium 3.5 - 5.1 mmol/L 3.6  3.6  3.5   Chloride 98 - 111 mmol/L 106  108  107  CO2 22 - 32 mmol/L 28  28  27    Calcium 8.9 - 10.3 mg/dL 8.8  8.4  8.6      ASSESSMENT & PLAN: Patient is a 57 y.o. male returns for a follow up for continued management of SCC Moore.  #SCC Moore --Treatment includes weekly cisplatin with concurrent radiation. Radiation started 06/03/2022. Planned treatment includes 70 Gy in 35 Fx. Started weekly cisplatin on 06/05/2022.  --Due for Cycle 6 of weekly cisplatin tomorrow, 07/10/2022.  --Labs from today were reviewed with the patient. WBC 2.6, Hgb 10.1, Plt 164K, ANC 2.0, Creatinine normal. Magnesium 1.6. --Proceed with treatment tomorrow without any dose modifications.  --RTC on 07/17/2022 for labs, follow up visit before Cycle 7 of cisplatin.   #Hypomagnesemia -Mild 1.6 today. Will receive 2 gm of IV magnesium tomorrow per treatment plan.  #Cancer related pain - Patient with worsening throat pain secondary to radiation.  - Hycet has improved the pain, recommend to continue to take every 4-6 hours PRN. Sent refill   #Mucositis - Worsening and now interfering with PO intake - Unable to tolerate lidocaine suspension. Will send liquid carafate with directions to swish and spit QID as needed.  - Weight is down 10 pounds in 1 week. Discussed using G tube more frequently. Sees dietician next tomorrow on 07/10/22.    No orders of the defined types were placed in this encounter.   All questions were answered. The patient knows to call the clinic with any problems, questions or concerns. No barriers to learning was detected.  I have spent a total of 30 minutes minutes of face-to-face and non-face-to-face time, preparing to see the patient, performing a medically appropriate examination, counseling and educating the patient, ordering medications, documenting clinical information in the electronic health record,  and care  coordination.    Briant CedarIrene T Owain Eckerman, PA-C Department of Hematology/Oncology [facility] Phone: [phone]    07/09/2022 10:27 AM

## 2022-07-10 ENCOUNTER — Other Ambulatory Visit (HOSPITAL_COMMUNITY): Payer: Self-pay

## 2022-07-10 ENCOUNTER — Telehealth: Payer: Self-pay | Admitting: *Deleted

## 2022-07-10 ENCOUNTER — Other Ambulatory Visit: Payer: Self-pay

## 2022-07-10 ENCOUNTER — Inpatient Hospital Stay: Payer: 59

## 2022-07-10 ENCOUNTER — Inpatient Hospital Stay: Payer: 59 | Admitting: Dietician

## 2022-07-10 ENCOUNTER — Ambulatory Visit
Admission: RE | Admit: 2022-07-10 | Discharge: 2022-07-10 | Disposition: A | Discharge status: Home / Self Care | Payer: 59 | Source: Ambulatory Visit | Attending: Radiation Oncology | Admitting: Radiation Oncology

## 2022-07-10 VITALS — BP 121/75 | HR 72 | Resp 14

## 2022-07-10 DIAGNOSIS — C090 Malignant neoplasm of tonsillar fossa: Secondary | ICD-10-CM

## 2022-07-10 DIAGNOSIS — Z510 Encounter for antineoplastic radiation therapy: Secondary | ICD-10-CM | POA: Diagnosis not present

## 2022-07-10 LAB — RAD ONC ARIA SESSION SUMMARY
Course Elapsed Days: 37
Plan Fractions Treated to Date: 27
Plan Prescribed Dose Per Fraction: 2 Gy
Plan Total Fractions Prescribed: 35
Plan Total Prescribed Dose: 70 Gy
Reference Point Dosage Given to Date: 54 Gy
Reference Point Session Dosage Given: 2 Gy
Session Number: 27

## 2022-07-10 MED ORDER — PALONOSETRON HCL INJECTION 0.25 MG/5ML
0.2500 mg | Freq: Once | INTRAVENOUS | Status: AC
  Administered 2022-07-10: 0.25 mg via INTRAVENOUS
  Filled 2022-07-10: qty 5

## 2022-07-10 MED ORDER — SODIUM CHLORIDE 0.9 % IV SOLN
150.0000 mg | Freq: Once | INTRAVENOUS | Status: AC
  Administered 2022-07-10: 150 mg via INTRAVENOUS
  Filled 2022-07-10: qty 150

## 2022-07-10 MED ORDER — SODIUM CHLORIDE 0.9 % IV SOLN
10.0000 mg | Freq: Once | INTRAVENOUS | Status: AC
  Administered 2022-07-10: 10 mg via INTRAVENOUS
  Filled 2022-07-10: qty 10

## 2022-07-10 MED ORDER — SODIUM CHLORIDE 0.9 % IV SOLN
40.0000 mg/m2 | Freq: Once | INTRAVENOUS | Status: AC
  Administered 2022-07-10: 100 mg via INTRAVENOUS
  Filled 2022-07-10: qty 100

## 2022-07-10 MED ORDER — POTASSIUM CHLORIDE IN NACL 20-0.9 MEQ/L-% IV SOLN
Freq: Once | INTRAVENOUS | Status: AC
  Filled 2022-07-10: qty 1000

## 2022-07-10 MED ORDER — SODIUM CHLORIDE 0.9 % IV SOLN: Freq: Once | INTRAVENOUS | Status: AC

## 2022-07-10 MED ORDER — SODIUM CHLORIDE 0.9% FLUSH
10.0000 mL | INTRAVENOUS | Status: DC | PRN
  Administered 2022-07-10: 10 mL

## 2022-07-10 MED ORDER — MAGNESIUM SULFATE 2 GM/50ML IV SOLN
2.0000 g | Freq: Once | INTRAVENOUS | Status: AC
  Administered 2022-07-10: 2 g via INTRAVENOUS
  Filled 2022-07-10: qty 50

## 2022-07-10 MED ORDER — HEPARIN SOD (PORK) LOCK FLUSH 100 UNIT/ML IV SOLN
500.0000 [IU] | Freq: Once | INTRAVENOUS | Status: AC | PRN
  Administered 2022-07-10: 500 [IU]

## 2022-07-10 NOTE — Patient Instructions (Signed)
[facility]  Discharge Instructions: Thank you for choosing [facility] to provide your oncology and hematology care.   If you have a lab appointment with the Cancer Center, please go directly to the Cancer Center and check in at the registration area.   Wear comfortable clothing and clothing appropriate for easy access to any Portacath or PICC line.   We strive to give you quality time with your provider. You may need to reschedule your appointment if you arrive late (15 or more minutes).  Arriving late affects you and other patients whose appointments are after yours.  Also, if you miss three or more appointments without notifying the office, you may be dismissed from the clinic at the provider's discretion.      For prescription refill requests, have your pharmacy contact our office and allow 72 hours for refills to be completed.    Today you received the following chemotherapy and/or immunotherapy agents Cisplatin      To help prevent nausea and vomiting after your treatment, we encourage you to take your nausea medication as directed.  BELOW ARE SYMPTOMS THAT SHOULD BE REPORTED IMMEDIATELY: *FEVER GREATER THAN 100.4 F (38 C) OR HIGHER *CHILLS OR SWEATING *NAUSEA AND VOMITING THAT IS NOT CONTROLLED WITH YOUR NAUSEA MEDICATION *UNUSUAL SHORTNESS OF BREATH *UNUSUAL BRUISING OR BLEEDING *URINARY PROBLEMS (pain or burning when urinating, or frequent urination) *BOWEL PROBLEMS (unusual diarrhea, constipation, pain near the anus) TENDERNESS IN MOUTH AND THROAT WITH OR WITHOUT PRESENCE OF ULCERS (sore throat, sores in mouth, or a toothache) UNUSUAL RASH, SWELLING OR PAIN  UNUSUAL VAGINAL DISCHARGE OR ITCHING   Items with * indicate a potential emergency and should be followed up as soon as possible or go to the Emergency Department if any problems should occur.  Please show the CHEMOTHERAPY ALERT CARD or IMMUNOTHERAPY ALERT CARD at  check-in to the Emergency Department and triage nurse.  Should you have questions after your visit or need to cancel or reschedule your appointment, please contact [facility]  Dept: [phone]  and follow the prompts.  Office hours are 8:00 a.m. to 4:30 p.m. Monday - Friday. Please note that voicemails left after 4:00 p.m. may not be returned until the following business day.  We are closed weekends and major holidays. You have access to a nurse at all times for urgent questions. Please call the main number to the clinic Dept: [phone] and follow the prompts.   For any non-urgent questions, you may also contact your provider using MyChart. We now offer e-Visits for anyone 18 and older to request care online for non-urgent symptoms. For details visit [email or web address].   Also download the MyChart app! Go to the app store, search "MyChart", open the app, select [place], and log in with your MyChart username and password.   

## 2022-07-10 NOTE — Progress Notes (Signed)
Proceed with Mag IV 2 gm. No additional Mag IV today per MD.  Richardean Sale, RPH, BCPS, BCOP 07/10/2022 10:05 AM

## 2022-07-10 NOTE — Progress Notes (Addendum)
Nutrition Follow-up:  Patient with SCC of right tonsil, P16 positive. He is receiving concurrent chemoradiation. S/p PEG    Met with patient in infusion. States he "hit a wall on Sunday." Patient reports sore throat and pain with swallowing. He is unable to tolerate taste of lidocaine. Patient using Hycet. This helps a little. Saliva is thick. He is tolerating small bites of soft foods including olives, pickles, pudding. Patient recalls 2 hard boiled eggs from cafeteria this morning. He has increased tube feedings. Patient giving 2-3 [facility] via tube.  Says he tolerates this better than the Osmolite. Bowel movements are smaller than usual, however constipation resolved with Molli Posey trial. Patient giving one 16 ounce bottle water via tube. He is drinking 32 ounces water orally.    Medications: carafate, hycet  Labs: 4/9 - Mg 1.6  Anthropometrics: Ongoing wt loss. Pt 250 lb 9.6 oz on 4/9 - 4% (10 lbs) in 7 days - severe  4/2 - 260 lb 1.6 oz  3/26 - 262 lb 11.2 oz  3/20 - 268 lb  3/11 - 277 lb 9.6 oz  Estimated Energy Needs   Kcals: 2950-3300 Protein: 140-165 Fluid: >/= 3L   NUTRITION DIAGNOSIS: Unintended weight loss continues - pt supplementing with TF    INTERVENTION:  Pt agreeable to increase tube feedings to goal as noted below Pt tolerated 650 ml bolus (2 cartons) Molli Posey 1.4 with 240 ml water flush during treatment - formula and syringe provided  Encouraged bites/sips of soft moist foods as tolerated  2 cases Molli Posey 1.4 provided   DME: [employer] - Compleat 1.4 formula [employer] equivalent) and [employer] tube supplies   Give 2 cartons Compleat 1.4 via tube QID (2000 ml/day). Flush tube with 120 ml water before and after each bolus + additional 240 ml water once daily Provides 2800 kcal, 144 grams protein, 1552 ml free water (2752 ml total water). Meets 100% RDI's    MONITORING, EVALUATION, GOAL: weight trends, intake, TF   NEXT VISIT: Wednesday April 17  during infusion

## 2022-07-10 NOTE — Progress Notes (Signed)
Per [place], MD okay to treat with Mag 1.6, no need for additional Magnesium, proceed with ordered 2 grams prior to treatment.

## 2022-07-10 NOTE — Telephone Encounter (Signed)
Received VM from pt spouse stating a medication needs PA. RN attempt x1 to return call to inquire what medication is needing the PA.  No answer, LVM for pt to return call to the office.

## 2022-07-11 ENCOUNTER — Other Ambulatory Visit (HOSPITAL_COMMUNITY): Payer: Self-pay

## 2022-07-11 ENCOUNTER — Telehealth: Payer: Self-pay | Admitting: *Deleted

## 2022-07-11 ENCOUNTER — Telehealth: Payer: Self-pay

## 2022-07-11 ENCOUNTER — Ambulatory Visit
Admission: RE | Admit: 2022-07-11 | Discharge: 2022-07-11 | Disposition: A | Discharge status: Home / Self Care | Payer: 59 | Source: Ambulatory Visit | Attending: Radiation Oncology | Admitting: Radiation Oncology

## 2022-07-11 ENCOUNTER — Other Ambulatory Visit: Payer: Self-pay

## 2022-07-11 DIAGNOSIS — Z510 Encounter for antineoplastic radiation therapy: Secondary | ICD-10-CM | POA: Diagnosis not present

## 2022-07-11 LAB — RAD ONC ARIA SESSION SUMMARY
Course Elapsed Days: 38
Plan Fractions Treated to Date: 28
Plan Prescribed Dose Per Fraction: 2 Gy
Plan Total Fractions Prescribed: 35
Plan Total Prescribed Dose: 70 Gy
Reference Point Dosage Given to Date: 56 Gy
Reference Point Session Dosage Given: 2 Gy
Session Number: 28

## 2022-07-11 NOTE — Telephone Encounter (Signed)
Notified Patient by voicemail of prior authorization approval for Sucralfate 1GM/49ml Solution. Medication is approved through 07/11/2023.

## 2022-07-11 NOTE — Telephone Encounter (Signed)
Notified Patient by voicemail of prior authorization approval for Hydrocodone 7.5/325 mg/49ml Solution. Medication is approved through 07/11/2023.

## 2022-07-12 ENCOUNTER — Other Ambulatory Visit (HOSPITAL_COMMUNITY): Payer: Self-pay

## 2022-07-12 ENCOUNTER — Ambulatory Visit
Admission: RE | Admit: 2022-07-12 | Discharge: 2022-07-12 | Disposition: A | Discharge status: Home / Self Care | Payer: 59 | Source: Ambulatory Visit | Attending: Radiation Oncology | Admitting: Radiation Oncology

## 2022-07-12 ENCOUNTER — Other Ambulatory Visit: Payer: Self-pay

## 2022-07-12 DIAGNOSIS — Z510 Encounter for antineoplastic radiation therapy: Secondary | ICD-10-CM | POA: Diagnosis not present

## 2022-07-12 LAB — RAD ONC ARIA SESSION SUMMARY
Course Elapsed Days: 39
Plan Fractions Treated to Date: 29
Plan Prescribed Dose Per Fraction: 2 Gy
Plan Total Fractions Prescribed: 35
Plan Total Prescribed Dose: 70 Gy
Reference Point Dosage Given to Date: 58 Gy
Reference Point Session Dosage Given: 2 Gy
Session Number: 29

## 2022-07-15 ENCOUNTER — Telehealth: Payer: Self-pay | Admitting: Dietician

## 2022-07-15 ENCOUNTER — Encounter: Payer: 59 | Admitting: Dietician

## 2022-07-15 ENCOUNTER — Ambulatory Visit: Payer: 59

## 2022-07-15 NOTE — Telephone Encounter (Signed)
Brief Nutrition Follow-up:  Call placed to patient for nutrition follow-up. Spoke with wife of patient. She reports patient is having difficulty with speaking due to pain. His neck is "rather crispy." He is eating some orally. States he ate really well on Saturday. Patient able to drink a glass of chocolate milk this morning.   Received email from [employer] [facility]) stating they have been unable to reach patient regarding tube feeding. Wife reports she had 2 missed calls from company on Friday (4/12). Wife states there was no message left so she did not call them back. Wife is unsure if VM was left on patient phone. Wife reports he has plenty of formula at this time. States he has been giving 3 cartons daily.   RD informed wife that patient was instructed to increase tube feedings to goal of 6 cartons/day at last nutrition follow-up (4/10). Wife states she was unaware of this. She will be helping patient with feedings today and will increase to goal.   RD to contact [employer] with request to contact patient wife. Patient to pick up 2 additional cases of Molli Posey 4/16. Wife is aware.   Will plan to see patient for follow-up as scheduled on Wednesday April 17 during infusion.

## 2022-07-16 ENCOUNTER — Telehealth: Payer: Self-pay

## 2022-07-16 ENCOUNTER — Ambulatory Visit: Payer: 59

## 2022-07-16 MED FILL — Fosaprepitant Dimeglumine For IV Infusion 150 MG (Base Eq): INTRAVENOUS | Qty: 5 | Status: AC

## 2022-07-16 MED FILL — Dexamethasone Sodium Phosphate Inj 100 MG/10ML: INTRAMUSCULAR | Qty: 1 | Status: AC

## 2022-07-16 NOTE — Telephone Encounter (Signed)
Rn Victorino Dike attempted to call pt due to two missed radiation treatments. Rn left message for pt on his home phone and cell phone as well. Rn will attempt to call back later as well.

## 2022-07-16 NOTE — Telephone Encounter (Signed)
Pt wife called RN back concerning pt missed radiation treatments. Pt wife stated he had a really rough weekend with pain and mouth sores. He did not feel like he could come to treatment the last two days due to this mouth pain and discomfort. She reports she did start giving him his liquid pain meds through his feeding tube and she is hoping that this will help. She is also going to start giving him his Carafate as well to help heal his mouth. She reports he has blistering and skin irritation on the right side of his mouth and face. She is applying lotion to this area to help it heal (she reports skin is coming off with light wiping).She also reports that the pt states the he is having internal mouth blisters/sores too. She continues to say he continues to lose weight though he is eating (though small amounts and doing tube feeds as prescribed). She feels like he is getting frustrated overall with the effects of the treatments. She stated she was going to try her best to get him to come tomorrow to see Dr. Basilio Cairo (for PUT) and also for his chemo treatment as well. She is concerned that he may not want to finish treatment due to the side effects. Rn assured pt wife that Dr. Basilio Cairo and team could help weigh in on what is going on and aid in the process for the patient. She knows to all with any problems or concerns.

## 2022-07-17 ENCOUNTER — Inpatient Hospital Stay: Payer: 59 | Admitting: Dietician

## 2022-07-17 ENCOUNTER — Inpatient Hospital Stay: Payer: 59

## 2022-07-17 ENCOUNTER — Ambulatory Visit
Admission: RE | Admit: 2022-07-17 | Discharge: 2022-07-17 | Disposition: A | Discharge status: Home / Self Care | Payer: 59 | Source: Ambulatory Visit | Attending: Radiation Oncology | Admitting: Radiation Oncology

## 2022-07-17 ENCOUNTER — Encounter: Payer: Self-pay | Admitting: Adult Health

## 2022-07-17 ENCOUNTER — Other Ambulatory Visit: Payer: Self-pay

## 2022-07-17 ENCOUNTER — Inpatient Hospital Stay (HOSPITAL_BASED_OUTPATIENT_CLINIC_OR_DEPARTMENT_OTHER): Payer: 59 | Admitting: Adult Health

## 2022-07-17 VITALS — BP 136/72 | HR 90 | Resp 16

## 2022-07-17 DIAGNOSIS — Z95828 Presence of other vascular implants and grafts: Secondary | ICD-10-CM

## 2022-07-17 DIAGNOSIS — C090 Malignant neoplasm of tonsillar fossa: Secondary | ICD-10-CM | POA: Diagnosis not present

## 2022-07-17 DIAGNOSIS — Z510 Encounter for antineoplastic radiation therapy: Secondary | ICD-10-CM | POA: Diagnosis not present

## 2022-07-17 LAB — CBC WITH DIFFERENTIAL (CANCER CENTER ONLY)
Abs Immature Granulocytes: 0.05 10*3/uL (ref 0.00–0.07)
Basophils Absolute: 0 10*3/uL (ref 0.0–0.1)
Basophils Relative: 0 %
Eosinophils Absolute: 0 10*3/uL (ref 0.0–0.5)
Eosinophils Relative: 0 %
HCT: 27.3 % — ABNORMAL LOW (ref 39.0–52.0)
Hemoglobin: 9.6 g/dL — ABNORMAL LOW (ref 13.0–17.0)
Immature Granulocytes: 1 %
Lymphocytes Relative: 5 %
Lymphs Abs: 0.2 10*3/uL — ABNORMAL LOW (ref 0.7–4.0)
MCH: 30.4 pg (ref 26.0–34.0)
MCHC: 35.2 g/dL (ref 30.0–36.0)
MCV: 86.4 fL (ref 80.0–100.0)
Monocytes Absolute: 0.4 10*3/uL (ref 0.1–1.0)
Monocytes Relative: 10 %
Neutro Abs: 3.4 10*3/uL (ref 1.7–7.7)
Neutrophils Relative %: 84 %
Platelet Count: 106 10*3/uL — ABNORMAL LOW (ref 150–400)
RBC: 3.16 MIL/uL — ABNORMAL LOW (ref 4.22–5.81)
RDW: 17.5 % — ABNORMAL HIGH (ref 11.5–15.5)
WBC Count: 4 10*3/uL (ref 4.0–10.5)
nRBC: 0 % (ref 0.0–0.2)

## 2022-07-17 LAB — BASIC METABOLIC PANEL - CANCER CENTER ONLY
Anion gap: 5 (ref 5–15)
BUN: 18 mg/dL (ref 6–20)
CO2: 30 mmol/L (ref 22–32)
Calcium: 8.7 mg/dL — ABNORMAL LOW (ref 8.9–10.3)
Chloride: 107 mmol/L (ref 98–111)
Creatinine: 0.99 mg/dL (ref 0.61–1.24)
GFR, Estimated: >60 mL/min (ref >60)
Glucose, Bld: 100 mg/dL — ABNORMAL HIGH (ref 70–99)
Potassium: 4.2 mmol/L (ref 3.5–5.1)
Sodium: 142 mmol/L (ref 135–145)

## 2022-07-17 LAB — RAD ONC ARIA SESSION SUMMARY
Course Elapsed Days: 44
Plan Fractions Treated to Date: 30
Plan Prescribed Dose Per Fraction: 2 Gy
Plan Total Fractions Prescribed: 35
Plan Total Prescribed Dose: 70 Gy
Reference Point Dosage Given to Date: 60 Gy
Reference Point Session Dosage Given: 2 Gy
Session Number: 30

## 2022-07-17 LAB — MAGNESIUM: Magnesium: 1.5 mg/dL — ABNORMAL LOW (ref 1.7–2.4)

## 2022-07-17 MED ORDER — SODIUM CHLORIDE 0.9 % IV SOLN
150.0000 mg | Freq: Once | INTRAVENOUS | Status: AC
  Administered 2022-07-17: 150 mg via INTRAVENOUS
  Filled 2022-07-17: qty 150

## 2022-07-17 MED ORDER — SODIUM CHLORIDE 0.9 % IV SOLN
10.0000 mg | Freq: Once | INTRAVENOUS | Status: AC
  Administered 2022-07-17: 10 mg via INTRAVENOUS
  Filled 2022-07-17: qty 10

## 2022-07-17 MED ORDER — MAGNESIUM SULFATE 2 GM/50ML IV SOLN
2.0000 g | Freq: Once | INTRAVENOUS | Status: AC
  Administered 2022-07-17: 2 g via INTRAVENOUS
  Filled 2022-07-17: qty 50

## 2022-07-17 MED ORDER — SODIUM CHLORIDE 0.9% FLUSH
10.0000 mL | INTRAVENOUS | Status: DC | PRN
  Administered 2022-07-17: 10 mL

## 2022-07-17 MED ORDER — PALONOSETRON HCL INJECTION 0.25 MG/5ML
0.2500 mg | Freq: Once | INTRAVENOUS | Status: AC
  Administered 2022-07-17: 0.25 mg via INTRAVENOUS
  Filled 2022-07-17: qty 5

## 2022-07-17 MED ORDER — SODIUM CHLORIDE 0.9 % IV SOLN: Freq: Once | INTRAVENOUS | Status: AC

## 2022-07-17 MED ORDER — HEPARIN SOD (PORK) LOCK FLUSH 100 UNIT/ML IV SOLN
500.0000 [IU] | Freq: Once | INTRAVENOUS | Status: AC | PRN
  Administered 2022-07-17: 500 [IU]

## 2022-07-17 MED ORDER — SODIUM CHLORIDE 0.9% FLUSH
10.0000 mL | Freq: Once | INTRAVENOUS | Status: AC
  Administered 2022-07-17: 10 mL

## 2022-07-17 MED ORDER — POTASSIUM CHLORIDE IN NACL 20-0.9 MEQ/L-% IV SOLN
Freq: Once | INTRAVENOUS | Status: AC
  Filled 2022-07-17: qty 1000

## 2022-07-17 MED ORDER — SODIUM CHLORIDE 0.9 % IV SOLN
40.0000 mg/m2 | Freq: Once | INTRAVENOUS | Status: AC
  Administered 2022-07-17: 100 mg via INTRAVENOUS
  Filled 2022-07-17: qty 100

## 2022-07-17 NOTE — Assessment & Plan Note (Signed)
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  He is currently undergoing concurrent chemoradiation and today is his last cycle of cisplatin and next week he will complete his radiation.  He is tolerating this as expected and is understandably fatigued and experiencing increased symptoms as he nears the end of his treatment.  He is walking around which is a positive sign and he also is tolerating his nutrition with his tube feeds well.  His weight is stable compared to last week which is a positive sign.    I reviewed his side effects with Dr. Al Pimple today and he will proceed with cycle 7 of cisplatin.  I also ordered a magnesium supplement IV if he is able to receive this today in infusion.  He will return in 2 weeks for labs and follow-up with Dr. Al Pimple to discuss next steps now that he is completing his chemoradiation.

## 2022-07-17 NOTE — Progress Notes (Signed)
Nutrition Follow-up:  Patient with SCC of right tonsil, P16 positive. He is receiving concurrent chemoradiation. S/p PEG    Met with patient in infusion. He reports worsening pain in throat and thick saliva. His neck is peeling. 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 This wore him out. Patient slept all day Sunday and Monday. Patient is pushing oral intake as able. He recalls eating well on Saturday (chicken, shrimp, mashed potatoes, butter pecan ice cream). Sunday patient had chicken/tomato soup mixed and can of ravioli. Patient has been supplementing with tube feedings, reports 3-4 [employer] 1.4. He is agreeable to increase to goal as noted below. Patient received shipment of formula and supplies from [employer]. He was not sent syringes for [employer] tube. Wife has contacted company.     Medications: Hycet, carafate   Labs: Mg 1.5  Anthropometrics: Wt 251 lb 14.4 oz today - increased   4/10 - 250 lb  4/2 - 260 lb 1.6 oz  3/26 - 262 lb 11.2 oz 3/20 - 268 lb   Estimated Energy Needs   Kcals: 2950-3300 Protein: 140-165 Fluid: >/= 3L   NUTRITION DIAGNOSIS: Unintended weight loss continues - pt supplementing with TF   INTERVENTION:  Encouraged soft moist foods as tolerated Enfit syringes + 2 cartons [employer] 1.4 for bolus feeding - pt to give during infusion    Give 2 cartons Compleat 1.4 via tube QID (2000 ml/day). Flush tube with 120 ml water before and after each bolus + additional 240 ml water once daily Provides 2800 kcal, 144 grams protein, 1552 ml free water (2752 ml total water).   DME: [employer] - Compleat 1.4 formula ([employer] equivalent) and [employer] tube supplies   MONITORING, EVALUATION, GOAL: weight trends, intake, tube feeding   NEXT VISIT: Thursday April 25 via telephone

## 2022-07-17 NOTE — Progress Notes (Signed)
Per Lillard Anes, NP okay to run post hydration with Cisplatin

## 2022-07-17 NOTE — Patient Instructions (Signed)
[facility]  Discharge Instructions: Thank you for choosing [facility] to provide your oncology and hematology care.   If you have a lab appointment with the Cancer Center, please go directly to the Cancer Center and check in at the registration area.   Wear comfortable clothing and clothing appropriate for easy access to any Portacath or PICC line.   We strive to give you quality time with your provider. You may need to reschedule your appointment if you arrive late (15 or more minutes).  Arriving late affects you and other patients whose appointments are after yours.  Also, if you miss three or more appointments without notifying the office, you may be dismissed from the clinic at the provider's discretion.      For prescription refill requests, have your pharmacy contact our office and allow 72 hours for refills to be completed.    Today you received the following chemotherapy and/or immunotherapy agents Cisplatin      To help prevent nausea and vomiting after your treatment, we encourage you to take your nausea medication as directed.  BELOW ARE SYMPTOMS THAT SHOULD BE REPORTED IMMEDIATELY: *FEVER GREATER THAN 100.4 F (38 C) OR HIGHER *CHILLS OR SWEATING *NAUSEA AND VOMITING THAT IS NOT CONTROLLED WITH YOUR NAUSEA MEDICATION *UNUSUAL SHORTNESS OF BREATH *UNUSUAL BRUISING OR BLEEDING *URINARY PROBLEMS (pain or burning when urinating, or frequent urination) *BOWEL PROBLEMS (unusual diarrhea, constipation, pain near the anus) TENDERNESS IN MOUTH AND THROAT WITH OR WITHOUT PRESENCE OF ULCERS (sore throat, sores in mouth, or a toothache) UNUSUAL RASH, SWELLING OR PAIN  UNUSUAL VAGINAL DISCHARGE OR ITCHING   Items with * indicate a potential emergency and should be followed up as soon as possible or go to the Emergency Department if any problems should occur.  Please show the CHEMOTHERAPY ALERT CARD or IMMUNOTHERAPY ALERT CARD at  check-in to the Emergency Department and triage nurse.  Should you have questions after your visit or need to cancel or reschedule your appointment, please contact [facility]  Dept: [phone]  and follow the prompts.  Office hours are 8:00 a.m. to 4:30 p.m. Monday - Friday. Please note that voicemails left after 4:00 p.m. may not be returned until the following business day.  We are closed weekends and major holidays. You have access to a nurse at all times for urgent questions. Please call the main number to the clinic Dept: [phone] and follow the prompts.   For any non-urgent questions, you may also contact your provider using MyChart. We now offer e-Visits for anyone 18 and older to request care online for non-urgent symptoms. For details visit [email or web address].   Also download the MyChart app! Go to the app store, search "MyChart", open the app, select [place], and log in with your MyChart username and password.   

## 2022-07-17 NOTE — Progress Notes (Signed)
[facility] Cancer Follow up:    Aaron Reichmann, DO [address] [place] [address] [place] [address]   DIAGNOSIS:  Cancer Staging  Cancer of tonsillar fossa Staging form: Pharynx - HPV-Mediated Oropharynx, AJCC 8th Edition - Clinical stage from 05/08/2022: Stage II (cT3, cN1, cM0, p16+) - Signed by Lonie Peak, MD on 05/08/2022 Stage prefix: Initial diagnosis   SUMMARY OF ONCOLOGIC HISTORY: Oncology History  Cancer of tonsillar fossa  05/08/2022 Initial Diagnosis   Cancer of tonsillar fossa (HCC)   05/08/2022 Cancer Staging   Staging form: Pharynx - HPV-Mediated Oropharynx, AJCC 8th Edition - Clinical stage from 05/08/2022: Stage II (cT3, cN1, cM0, p16+) - Signed by Lonie Peak, MD on 05/08/2022 Stage prefix: Initial diagnosis   06/05/2022 -  Chemotherapy   Patient is on Treatment Plan : HEAD/NECK Cisplatin (40) q7d       CURRENT THERAPY: Concurrent chemoradiation  INTERVAL HISTORY: Labarron Durnin 57 y.o. male returns for follow-up prior to receiving his renal cycle of weekly concurrent chemoradiation with cisplatin.  Week 7 is due today.  He has been struggling with increased fatigue and dysphagia.  He also has a dyne aphasia and his throat pain is on a scale of 8 out of 10.  He takes his oxycodone which he says helps minimally.  He continues to do tube feeds and flushes 5 times a day as directed by nutrition.  He denies any nausea or vomiting.  He will finish radiation on April 24 which is next week.  He tells me he does have occasional episodes about 3 times a day of a brief ringing in his ears that lasts 2 to 5 seconds and then resolves.  He denies any hearing change.  He denies any peripheral neuropathy.  He denies any constipation or other side effects.     Patient Active Problem List   Diagnosis Date Noted   Port-A-Cath in place 06/04/2022   Cancer of tonsillar fossa 05/08/2022   Prostate cancer 02/07/2022   Medial epicondylitis of left elbow 03/08/2015    Obesity, Class II, BMI 35-39.9, with comorbidity 11/28/2010   Abdominal wall mass of epigastric region - old hernia sac/lipoma 11/28/2010   Abdominal pain, RLQ - muscle strain 11/28/2010    has No Known Allergies.  MEDICAL HISTORY: Past Medical History:  Diagnosis Date   Abdominal pain    Cancer    prostate cancer   Complication of anesthesia    slow to wake up 3 years ago after foot surgery   ED (erectile dysfunction)    Gout    Gout    History of kidney stones    Hypertension    Obesity, Class II, BMI 35-39.9, with comorbidity    Painful orthopaedic hardware    left foot    SURGICAL HISTORY: Past Surgical History:  Procedure Laterality Date   amputation of 2 toes on left foot      CARPAL TUNNEL RELEASE  05/03/2011   Procedure: CARPAL TUNNEL RELEASE;  Surgeon: Nicki Reaper, MD;  Location: [facility];  Service: Orthopedics;  Laterality: Left;   FOOT FRACTURE SURGERY Left    x3   HARDWARE REMOVAL Left 06/03/2019   Procedure: HARDWARE REMOVAL;  Surgeon: Terance Hart, MD;  Location: [facility];  Service: Orthopedics;  Laterality: Left;  PROCEDURE: LEFT FOOT REMOVAL OF HARDWARE, IRRIGATION AND DEBRIDEMENT,PLACEMENT OF CEMENT SPACER, DEBULKIN, SOFT TISSUE MASS LENGTH OF SURGERY: 2 HOURS   HERNIA REPAIR  02/06/2009   Lap  supraumb & umb VWH repairs   INCISION AND DRAINAGE OF WOUND Left 06/03/2019   Procedure: IRRIGATION AND DEBRIDEMENT WOUND WITH IMPLANTATION OF ANTIBIOTIC CEMENT SPACER;  Surgeon: Terance Hart, MD;  Location: [facility];  Service: Orthopedics;  Laterality: Left;   IR GASTROSTOMY TUBE MOD SED  05/29/2022   IR IMAGING GUIDED PORT INSERTION  05/29/2022   left rotator cuff surgery      LYMPHADENECTOMY Bilateral 02/07/2022   Procedure: BILATERAL PELVIC LYMPHADENECTOMY;  Surgeon: Heloise Purpura, MD;  Location: [facility];  Service: Urology;  Laterality: Bilateral;   MASS EXCISION Left 06/03/2019   Procedure:  DEBULKING LEFT FOOT MASS;  Surgeon: Terance Hart, MD;  Location: [facility];  Service: Orthopedics;  Laterality: Left;   ROBOT ASSISTED LAPAROSCOPIC RADICAL PROSTATECTOMY N/A 02/07/2022   Procedure: XI ROBOTIC ASSISTED LAPAROSCOPIC RADICAL PROSTATECTOMY LEVEL 3;  Surgeon: Heloise Purpura, MD;  Location: [facility];  Service: Urology;  Laterality: N/A;  210 MINUTES NEEDED FOR CASE    SOCIAL HISTORY: Social History   Socioeconomic History   Marital status: Married    Spouse name: Not on file   Number of children: Not on file   Years of education: Not on file   Highest education level: Not on file  Occupational History   Not on file  Tobacco Use   Smoking status: Never   Smokeless tobacco: Never  Vaping Use   Vaping Use: Never used  Substance and Sexual Activity   Alcohol use: No   Drug use: No   Sexual activity: Not on file  Other Topics Concern   Not on file  Social History Narrative   Not on file   Social Determinants of Health   Financial Resource Strain: Not on file  Food Insecurity: No Food Insecurity (02/07/2022)   Hunger Vital Sign    Worried About Running Out of Food in the Last Year: Never true    Ran Out of Food in the Last Year: Never true  Transportation Needs: No Transportation Needs (02/07/2022)   PRAPARE - Administrator, Civil Service (Medical): No    Lack of Transportation (Non-Medical): No  Physical Activity: Not on file  Stress: Not on file  Social Connections: Not on file  Intimate Partner Violence: Not At Risk (02/07/2022)   Humiliation, Afraid, Rape, and Kick questionnaire    Fear of Current or Ex-Partner: No    Emotionally Abused: No    Physically Abused: No    Sexually Abused: No    FAMILY HISTORY: Family History  Problem Relation Age of Onset   Diabetes Mother    Diabetes Father     Review of Systems  Constitutional:  Positive for fatigue. Negative for appetite change, chills, fever and unexpected weight  change.  HENT:   Positive for mouth sores, sore throat, tinnitus and trouble swallowing. Negative for hearing loss and lump/mass.   Eyes:  Negative for eye problems and icterus.  Respiratory:  Negative for chest tightness, cough and shortness of breath.   Cardiovascular:  Negative for chest pain, leg swelling and palpitations.  Gastrointestinal:  Negative for abdominal distention, abdominal pain, constipation, diarrhea, nausea and vomiting.  Endocrine: Negative for hot flashes.  Genitourinary:  Negative for difficulty urinating.   Musculoskeletal:  Negative for arthralgias.  Skin:  Positive for wound (Skin peeling on the right side of his neck from radiation). Negative for itching and rash.  Neurological:  Negative for dizziness, extremity weakness, headaches and numbness.  Hematological:  Negative for adenopathy. Does not bruise/bleed easily.  Psychiatric/Behavioral:  Negative for depression. The patient is not nervous/anxious.       PHYSICAL EXAMINATION   Onc Performance Status - 07/17/22 0936       ECOG Perf Status   ECOG Perf Status Restricted in physically strenuous activity but ambulatory and able to carry out work of a light or sedentary nature, e.g., light house work, office work      KPS SCALE   KPS % SCORE Able to carry on normal activity, minor s/s of disease             Vitals:   07/17/22 0927  BP: 133/75  Pulse: 94  Resp: 16  Temp: 98.1 F (36.7 C)  SpO2: 99%    Physical Exam Constitutional:      General: He is not in acute distress.    Appearance: Normal appearance. He is not toxic-appearing.  HENT:     Head: Normocephalic and atraumatic.     Mouth/Throat:     Mouth: Mucous membranes are moist.     Comments: Erythema and mucosal breakdown noted in his posterior pharynx.  No ulceration or Candida noted. Eyes:     General: No scleral icterus. Neck:     Comments: In the right crease between his neck and his shoulder he has some dry desquamation  present from radiation.  He does have tenderness along the cervical chain of his lymph nodes bilaterally when palpating.  No lymphadenopathy is noted. Cardiovascular:     Rate and Rhythm: Normal rate and regular rhythm.     Pulses: Normal pulses.     Heart sounds: Normal heart sounds.  Pulmonary:     Effort: Pulmonary effort is normal.     Breath sounds: Normal breath sounds.  Abdominal:     General: Abdomen is flat. Bowel sounds are normal. There is no distension.     Palpations: Abdomen is soft.     Tenderness: There is no abdominal tenderness.  Musculoskeletal:        General: No swelling.     Cervical back: Neck supple.  Lymphadenopathy:     Cervical: No cervical adenopathy.  Skin:    General: Skin is warm and dry.     Findings: No rash.  Neurological:     General: No focal deficit present.     Mental Status: He is alert.  Psychiatric:        Mood and Affect: Mood normal.        Behavior: Behavior normal.     LABORATORY DATA:  CBC    Component Value Date/Time   WBC 4.0 07/17/2022 0914   WBC 9.8 05/29/2022 1045   RBC 3.16 (L) 07/17/2022 0914   HGB 9.6 (L) 07/17/2022 0914   HCT 27.3 (L) 07/17/2022 0914   PLT 106 (L) 07/17/2022 0914   MCV 86.4 07/17/2022 0914   MCH 30.4 07/17/2022 0914   MCHC 35.2 07/17/2022 0914   RDW 17.5 (H) 07/17/2022 0914   LYMPHSABS 0.2 (L) 07/17/2022 0914   MONOABS 0.4 07/17/2022 0914   EOSABS 0.0 07/17/2022 0914   BASOSABS 0.0 07/17/2022 0914    CMP     Component Value Date/Time   NA 142 07/17/2022 0914   K 4.2 07/17/2022 0914   CL 107 07/17/2022 0914   CO2 30 07/17/2022 0914   GLUCOSE 100 (H) 07/17/2022 0914   BUN 18 07/17/2022 0914   CREATININE 0.99 07/17/2022 0914   CALCIUM 8.7 (L) 07/17/2022 1610  PROT 6.4 (L) 06/10/2022 0853   ALBUMIN 3.6 06/10/2022 0853   AST 45 (H) 06/10/2022 0853   ALT 171 (H) 06/10/2022 0853   ALKPHOS 72 06/10/2022 0853   BILITOT 0.4 06/10/2022 0853   GFRNONAA >60 07/17/2022 0914   GFRAA >60  10/21/2017 2005       ASSESSMENT and THERAPY PLAN:   Cancer of tonsillar fossa (HCC) Trey Paula is a 57 year old man with 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  He is currently undergoing concurrent chemoradiation and today is his last cycle of cisplatin and next week he will complete his radiation.  He is tolerating this as expected and is understandably fatigued and experiencing increased symptoms as he nears the end of his treatment.  He is walking around which is a positive sign and he also is tolerating his nutrition with his tube feeds well.  His weight is stable compared to last week which is a positive sign.    I reviewed his side effects with Dr. Al Pimple today and he will proceed with cycle 7 of cisplatin.  I also ordered a magnesium supplement IV if he is able to receive this today in infusion.  He will return in 2 weeks for labs and follow-up with Dr. Al Pimple to discuss next steps now that he is completing his chemoradiation.   All questions were answered. The patient knows to call the clinic with any problems, questions or concerns. We can certainly see the patient much sooner if necessary.  Total encounter time:20 minutes*in face-to-face visit time, chart review, lab review, care coordination, order entry, and documentation of the encounter time.    Lillard Anes, NP 07/17/22 10:29 AM Medical Oncology and Hematology [facility] [address] [place], [place] [address] Tel. [phone]    Fax. [phone]  *Total Encounter Time as defined by the Centers for Medicare and Medicaid Services includes, in addition to the face-to-face time of a patient visit (documented in the note above) non-face-to-face time: obtaining and reviewing outside history, ordering and reviewing medications, tests or procedures, care coordination (communications with other health care professionals or caregivers) and documentation in the medical record.

## 2022-07-18 ENCOUNTER — Encounter: Payer: Self-pay | Admitting: Hematology and Oncology

## 2022-07-18 ENCOUNTER — Other Ambulatory Visit: Payer: Self-pay

## 2022-07-18 ENCOUNTER — Ambulatory Visit
Admission: RE | Admit: 2022-07-18 | Discharge: 2022-07-18 | Disposition: A | Discharge status: Home / Self Care | Payer: 59 | Source: Ambulatory Visit | Attending: Radiation Oncology | Admitting: Radiation Oncology

## 2022-07-18 DIAGNOSIS — Z510 Encounter for antineoplastic radiation therapy: Secondary | ICD-10-CM | POA: Diagnosis not present

## 2022-07-18 LAB — RAD ONC ARIA SESSION SUMMARY
Course Elapsed Days: 45
Plan Fractions Treated to Date: 31
Plan Prescribed Dose Per Fraction: 2 Gy
Plan Total Fractions Prescribed: 35
Plan Total Prescribed Dose: 70 Gy
Reference Point Dosage Given to Date: 62 Gy
Reference Point Session Dosage Given: 2 Gy
Session Number: 31

## 2022-07-18 NOTE — Telephone Encounter (Signed)
No entry 

## 2022-07-19 ENCOUNTER — Ambulatory Visit
Admission: RE | Admit: 2022-07-19 | Discharge: 2022-07-19 | Disposition: A | Discharge status: Home / Self Care | Payer: 59 | Source: Ambulatory Visit | Attending: Radiation Oncology | Admitting: Radiation Oncology

## 2022-07-19 ENCOUNTER — Telehealth: Payer: Self-pay | Admitting: Hematology and Oncology

## 2022-07-19 ENCOUNTER — Other Ambulatory Visit: Payer: Self-pay

## 2022-07-19 ENCOUNTER — Ambulatory Visit: Payer: 59

## 2022-07-19 DIAGNOSIS — Z510 Encounter for antineoplastic radiation therapy: Secondary | ICD-10-CM | POA: Diagnosis not present

## 2022-07-19 LAB — RAD ONC ARIA SESSION SUMMARY
Course Elapsed Days: 46
Plan Fractions Treated to Date: 32
Plan Prescribed Dose Per Fraction: 2 Gy
Plan Total Fractions Prescribed: 35
Plan Total Prescribed Dose: 70 Gy
Reference Point Dosage Given to Date: 64 Gy
Reference Point Session Dosage Given: 2 Gy
Session Number: 32

## 2022-07-19 NOTE — Telephone Encounter (Signed)
Scheduled appointment per 4/17 los. Talked with the patients spouse and she is aware of the made appointments for the patient.

## 2022-07-22 ENCOUNTER — Ambulatory Visit
Admission: RE | Admit: 2022-07-22 | Discharge: 2022-07-22 | Disposition: A | Discharge status: Home / Self Care | Payer: 59 | Source: Ambulatory Visit | Attending: Radiation Oncology | Admitting: Radiation Oncology

## 2022-07-22 ENCOUNTER — Ambulatory Visit: Payer: 59

## 2022-07-22 ENCOUNTER — Other Ambulatory Visit: Payer: Self-pay

## 2022-07-22 DIAGNOSIS — Z510 Encounter for antineoplastic radiation therapy: Secondary | ICD-10-CM | POA: Diagnosis not present

## 2022-07-22 LAB — RAD ONC ARIA SESSION SUMMARY
Course Elapsed Days: 49
Plan Fractions Treated to Date: 33
Plan Prescribed Dose Per Fraction: 2 Gy
Plan Total Fractions Prescribed: 35
Plan Total Prescribed Dose: 70 Gy
Reference Point Dosage Given to Date: 66 Gy
Reference Point Session Dosage Given: 2 Gy
Session Number: 33

## 2022-07-23 ENCOUNTER — Ambulatory Visit: Payer: 59

## 2022-07-23 ENCOUNTER — Other Ambulatory Visit: Payer: Self-pay

## 2022-07-23 ENCOUNTER — Ambulatory Visit
Admission: RE | Admit: 2022-07-23 | Discharge: 2022-07-23 | Disposition: A | Discharge status: Home / Self Care | Payer: 59 | Source: Ambulatory Visit | Attending: Radiation Oncology | Admitting: Radiation Oncology

## 2022-07-23 DIAGNOSIS — Z510 Encounter for antineoplastic radiation therapy: Secondary | ICD-10-CM | POA: Diagnosis not present

## 2022-07-23 LAB — RAD ONC ARIA SESSION SUMMARY
Course Elapsed Days: 50
Plan Fractions Treated to Date: 34
Plan Prescribed Dose Per Fraction: 2 Gy
Plan Total Fractions Prescribed: 35
Plan Total Prescribed Dose: 70 Gy
Reference Point Dosage Given to Date: 68 Gy
Reference Point Session Dosage Given: 2 Gy
Session Number: 34

## 2022-07-24 ENCOUNTER — Other Ambulatory Visit: Payer: Self-pay

## 2022-07-24 ENCOUNTER — Ambulatory Visit
Admission: RE | Admit: 2022-07-24 | Discharge: 2022-07-24 | Disposition: A | Discharge status: Home / Self Care | Payer: 59 | Source: Ambulatory Visit | Attending: Radiation Oncology | Admitting: Radiation Oncology

## 2022-07-24 DIAGNOSIS — Z510 Encounter for antineoplastic radiation therapy: Secondary | ICD-10-CM | POA: Diagnosis not present

## 2022-07-24 LAB — RAD ONC ARIA SESSION SUMMARY
Course Elapsed Days: 51
Plan Fractions Treated to Date: 35
Plan Prescribed Dose Per Fraction: 2 Gy
Plan Total Fractions Prescribed: 35
Plan Total Prescribed Dose: 70 Gy
Reference Point Dosage Given to Date: 70 Gy
Reference Point Session Dosage Given: 2 Gy
Session Number: 35

## 2022-07-24 NOTE — Progress Notes (Signed)
Oncology Nurse Navigator Documentation   Mr. Tuite completed radiation today. He has follow up appointments scheduled with Dr. Al Pimple on 5/2 and Dr. Basilio Cairo on 5/14. He and his wife know to call me if he has any questions or concerns.   Hedda Slade RN, BSN, OCN Head & Neck Oncology Nurse Navigator [facility] Phone # [phone]  Fax # [phone]

## 2022-07-25 ENCOUNTER — Inpatient Hospital Stay: Payer: 59 | Admitting: Dietician

## 2022-07-26 NOTE — Radiation Completion Notes (Signed)
Patient Name: Aaron Moore, Aaron Moore MRN: [id number] Date of Birth: 03-19-1966 Referring Physician: Rondel Oh, M.D. Date of Service: 2022-07-26 Radiation Oncologist: Lonie Peak, M.D. [facility]                             RADIATION ONCOLOGY END OF TREATMENT NOTE     Diagnosis: C09.0 Malignant neoplasm of tonsillar fossa Staging on 2022-05-08: Cancer of tonsillar fossa (HCC) T=cT3, N=cN1, M=cM0 Intent: Curative     ==========DELIVERED PLANS==========  First Treatment Date: 2022-06-03 - Last Treatment Date: 2022-07-24   Plan Name: HN_R_Tonsil Site: Tonsil, Right Technique: IMRT Mode: Photon Dose Per Fraction: 2 Gy Prescribed Dose (Delivered / Prescribed): 70 Gy / 70 Gy Prescribed Fxs (Delivered / Prescribed): 35 / 35     ==========ON TREATMENT VISIT DATES========== 2022-06-03, 2022-06-10, 2022-06-18, 2022-06-26, 2022-07-03, 2022-07-08, 2022-07-17, 2022-07-22     ==========UPCOMING VISITS==========       ==========APPENDIX - ON TREATMENT VISIT NOTES==========   See weekly On Treatment Notes is Epic for details.

## 2022-07-30 ENCOUNTER — Telehealth: Payer: Self-pay | Admitting: Dietician

## 2022-07-30 NOTE — Progress Notes (Signed)
Aaron Moore presents to clinic today for follow up for completion of radiation treatment of tonsill cancer. He completed treatment on 07-24-22.   Pain issues, if any: Denies any mouth or throat pain Using a feeding tube?: Not currently--reports he's been able to eat by mouth for the past week Weight changes, if any:  Wt Readings from Last 3 Encounters:  08/13/22 251 lb 4 oz (114 kg)  08/01/22 253 lb 4.8 oz (114.9 kg)  07/17/22 251 lb 14.4 oz (114.3 kg)   Swallowing issues, if any: Able to tolerate softer foods, or solids with a sauce/gravy. Reports he has to avoid bread like foods Smoking or chewing tobacco? None Using fluoride trays daily? Denies any dental concerns. Using fluoride toothpaste Last ENT visit was on: Not since diagnosis Other notable issues, if any: Continues to deal with fatigue, thick saliva, and altered sense of taste. Reports skin blistered and peeled after finishing treatment, but is not intact and well healed. F/U with MedOnc scheduled for June

## 2022-07-30 NOTE — Telephone Encounter (Signed)
Call placed to patient for nutrition follow-up. Left VM with request for return call. Contact information provided.

## 2022-07-31 ENCOUNTER — Other Ambulatory Visit: Payer: Self-pay | Admitting: *Deleted

## 2022-07-31 DIAGNOSIS — C090 Malignant neoplasm of tonsillar fossa: Secondary | ICD-10-CM

## 2022-08-01 ENCOUNTER — Inpatient Hospital Stay (HOSPITAL_BASED_OUTPATIENT_CLINIC_OR_DEPARTMENT_OTHER): Payer: 59 | Admitting: Hematology and Oncology

## 2022-08-01 ENCOUNTER — Inpatient Hospital Stay: Payer: 59 | Attending: Hematology and Oncology

## 2022-08-01 ENCOUNTER — Encounter: Payer: Self-pay | Admitting: Hematology and Oncology

## 2022-08-01 VITALS — BP 164/91 | HR 105 | Temp 97.3°F | Resp 15 | Wt 253.3 lb

## 2022-08-01 DIAGNOSIS — R5383 Other fatigue: Secondary | ICD-10-CM | POA: Insufficient documentation

## 2022-08-01 DIAGNOSIS — Z9221 Personal history of antineoplastic chemotherapy: Secondary | ICD-10-CM | POA: Insufficient documentation

## 2022-08-01 DIAGNOSIS — C090 Malignant neoplasm of tonsillar fossa: Secondary | ICD-10-CM

## 2022-08-01 DIAGNOSIS — Z931 Gastrostomy status: Secondary | ICD-10-CM | POA: Insufficient documentation

## 2022-08-01 DIAGNOSIS — Z923 Personal history of irradiation: Secondary | ICD-10-CM | POA: Diagnosis not present

## 2022-08-01 DIAGNOSIS — K117 Disturbances of salivary secretion: Secondary | ICD-10-CM | POA: Diagnosis not present

## 2022-08-01 LAB — CBC WITH DIFFERENTIAL (CANCER CENTER ONLY)
Abs Immature Granulocytes: 0.04 10*3/uL (ref 0.00–0.07)
Basophils Absolute: 0 10*3/uL (ref 0.0–0.1)
Basophils Relative: 1 %
Eosinophils Absolute: 0 10*3/uL (ref 0.0–0.5)
Eosinophils Relative: 1 %
HCT: 24.9 % — ABNORMAL LOW (ref 39.0–52.0)
Hemoglobin: 8.5 g/dL — ABNORMAL LOW (ref 13.0–17.0)
Immature Granulocytes: 2 %
Lymphocytes Relative: 12 %
Lymphs Abs: 0.2 10*3/uL — ABNORMAL LOW (ref 0.7–4.0)
MCH: 31.3 pg (ref 26.0–34.0)
MCHC: 34.1 g/dL (ref 30.0–36.0)
MCV: 91.5 fL (ref 80.0–100.0)
Monocytes Absolute: 0.4 10*3/uL (ref 0.1–1.0)
Monocytes Relative: 19 %
Neutro Abs: 1.2 10*3/uL — ABNORMAL LOW (ref 1.7–7.7)
Neutrophils Relative %: 65 %
Platelet Count: 210 10*3/uL (ref 150–400)
RBC: 2.72 MIL/uL — ABNORMAL LOW (ref 4.22–5.81)
RDW: 22.3 % — ABNORMAL HIGH (ref 11.5–15.5)
WBC Count: 1.9 10*3/uL — ABNORMAL LOW (ref 4.0–10.5)
nRBC: 2.1 % — ABNORMAL HIGH (ref 0.0–0.2)

## 2022-08-01 LAB — CMP (CANCER CENTER ONLY)
ALT: 14 U/L (ref 0–44)
AST: 13 U/L — ABNORMAL LOW (ref 15–41)
Albumin: 3.9 g/dL (ref 3.5–5.0)
Alkaline Phosphatase: 78 U/L (ref 38–126)
Anion gap: 7 (ref 5–15)
BUN: 27 mg/dL — ABNORMAL HIGH (ref 6–20)
CO2: 28 mmol/L (ref 22–32)
Calcium: 8.8 mg/dL — ABNORMAL LOW (ref 8.9–10.3)
Chloride: 107 mmol/L (ref 98–111)
Creatinine: 1.02 mg/dL (ref 0.61–1.24)
GFR, Estimated: >60 mL/min (ref >60)
Glucose, Bld: 123 mg/dL — ABNORMAL HIGH (ref 70–99)
Potassium: 4.3 mmol/L (ref 3.5–5.1)
Sodium: 142 mmol/L (ref 135–145)
Total Bilirubin: 0.5 mg/dL (ref 0.3–1.2)
Total Protein: 6.6 g/dL (ref 6.5–8.1)

## 2022-08-01 LAB — MAGNESIUM: Magnesium: 1.9 mg/dL (ref 1.7–2.4)

## 2022-08-01 MED ORDER — NYSTATIN 100000 UNIT/ML MT SUSP: 5.0000 mL | Freq: Four times a day (QID) | OROMUCOSAL | 0 refills | Status: DC

## 2022-08-01 NOTE — Progress Notes (Signed)
[facility] Cancer Follow up:    Aaron Reichmann, DO [address] [facility] [address] [place] [address]   DIAGNOSIS:  Cancer Staging  Cancer of tonsillar fossa (HCC) Staging form: Pharynx - HPV-Mediated Oropharynx, AJCC 8th Edition - Clinical stage from 05/08/2022: Stage II (cT3, cN1, cM0, p16+) - Signed by Lonie Peak, MD on 05/08/2022 Stage prefix: Initial diagnosis   SUMMARY OF ONCOLOGIC HISTORY: Oncology History  Cancer of tonsillar fossa (HCC)  05/08/2022 Initial Diagnosis   Cancer of tonsillar fossa (HCC)   05/08/2022 Cancer Staging   Staging form: Pharynx - HPV-Mediated Oropharynx, AJCC 8th Edition - Clinical stage from 05/08/2022: Stage II (cT3, cN1, cM0, p16+) - Signed by Lonie Peak, MD on 05/08/2022 Stage prefix: Initial diagnosis   06/05/2022 -  Chemotherapy   Patient is on Treatment Plan : HEAD/NECK Cisplatin (40) q7d       CURRENT THERAPY: Concurrent chemoradiation  INTERVAL HISTORY:  Aaron Moore 57 y.o. male returns for follow-up  He is still recovering from the treatment. He completed radiation last week agao. He is not able to eat food because he can't taste much.  He says he is not able to take the pain medication, it didn't feel good when he took it. Fatigue is his biggest complaint. He had a couple headaches, took some tylenol. Last week, 90% of the feeding is going through G tube, He is using 8 cartons of [employer] a day. He has some ringing nose in ears, tingling in feet.  Wt Readings from Last 3 Encounters:  08/01/22 253 lb 4.8 oz (114.9 kg)  07/17/22 251 lb 14.4 oz (114.3 kg)  07/09/22 250 lb 9.6 oz (113.7 kg)     Patient Active Problem List   Diagnosis Date Noted   Port-A-Cath in place 06/04/2022   Cancer of tonsillar fossa (HCC) 05/08/2022   Prostate cancer (HCC) 02/07/2022   Medial epicondylitis of left elbow 03/08/2015   Obesity, Class II, BMI 35-39.9, with comorbidity 11/28/2010   Abdominal wall mass of epigastric region - old  hernia sac/lipoma 11/28/2010   Abdominal pain, RLQ - muscle strain 11/28/2010    has No Known Allergies.  MEDICAL HISTORY: Past Medical History:  Diagnosis Date   Abdominal pain    Cancer (HCC)    prostate cancer   Complication of anesthesia    slow to wake up 3 years ago after foot surgery   ED (erectile dysfunction)    Gout    Gout    History of kidney stones    Hypertension    Obesity, Class II, BMI 35-39.9, with comorbidity    Painful orthopaedic hardware (HCC)    left foot    SURGICAL HISTORY: Past Surgical History:  Procedure Laterality Date   amputation of 2 toes on left foot      CARPAL TUNNEL RELEASE  05/03/2011   Procedure: CARPAL TUNNEL RELEASE;  Surgeon: Nicki Reaper, MD;  Location: [facility];  Service: Orthopedics;  Laterality: Left;   FOOT FRACTURE SURGERY Left    x3   HARDWARE REMOVAL Left 06/03/2019   Procedure: HARDWARE REMOVAL;  Surgeon: Terance Hart, MD;  Location: [facility];  Service: Orthopedics;  Laterality: Left;  PROCEDURE: LEFT FOOT REMOVAL OF HARDWARE, IRRIGATION AND DEBRIDEMENT,PLACEMENT OF CEMENT SPACER, DEBULKIN, SOFT TISSUE MASS LENGTH OF SURGERY: 2 HOURS   HERNIA REPAIR  02/06/2009   Lap supraumb & umb VWH repairs   INCISION AND DRAINAGE OF WOUND Left 06/03/2019   Procedure:  IRRIGATION AND DEBRIDEMENT WOUND WITH IMPLANTATION OF ANTIBIOTIC CEMENT SPACER;  Surgeon: Terance Hart, MD;  Location: [facility];  Service: Orthopedics;  Laterality: Left;   IR GASTROSTOMY TUBE MOD SED  05/29/2022   IR IMAGING GUIDED PORT INSERTION  05/29/2022   left rotator cuff surgery      LYMPHADENECTOMY Bilateral 02/07/2022   Procedure: BILATERAL PELVIC LYMPHADENECTOMY;  Surgeon: Heloise Purpura, MD;  Location: [facility];  Service: Urology;  Laterality: Bilateral;   MASS EXCISION Left 06/03/2019   Procedure: DEBULKING LEFT FOOT MASS;  Surgeon: Terance Hart, MD;  Location: [facility];   Service: Orthopedics;  Laterality: Left;   ROBOT ASSISTED LAPAROSCOPIC RADICAL PROSTATECTOMY N/A 02/07/2022   Procedure: XI ROBOTIC ASSISTED LAPAROSCOPIC RADICAL PROSTATECTOMY LEVEL 3;  Surgeon: Heloise Purpura, MD;  Location: [facility];  Service: Urology;  Laterality: N/A;  210 MINUTES NEEDED FOR CASE    SOCIAL HISTORY: Social History   Socioeconomic History   Marital status: Married    Spouse name: Not on file   Number of children: Not on file   Years of education: Not on file   Highest education level: Not on file  Occupational History   Not on file  Tobacco Use   Smoking status: Never   Smokeless tobacco: Never  Vaping Use   Vaping Use: Never used  Substance and Sexual Activity   Alcohol use: No   Drug use: No   Sexual activity: Not on file  Other Topics Concern   Not on file  Social History Narrative   Not on file   Social Determinants of Health   Financial Resource Strain: Not on file  Food Insecurity: No Food Insecurity (02/07/2022)   Hunger Vital Sign    Worried About Running Out of Food in the Last Year: Never true    Ran Out of Food in the Last Year: Never true  Transportation Needs: No Transportation Needs (02/07/2022)   PRAPARE - Administrator, Civil Service (Medical): No    Lack of Transportation (Non-Medical): No  Physical Activity: Not on file  Stress: Not on file  Social Connections: Not on file  Intimate Partner Violence: Not At Risk (02/07/2022)   Humiliation, Afraid, Rape, and Kick questionnaire    Fear of Current or Ex-Partner: No    Emotionally Abused: No    Physically Abused: No    Sexually Abused: No    FAMILY HISTORY: Family History  Problem Relation Age of Onset   Diabetes Mother    Diabetes Father     PHYSICAL EXAMINATION     Vitals:   08/01/22 1112  BP: (!) 164/91  Pulse: (!) 105  Resp: 15  Temp: (!) 97.3 F (36.3 C)  SpO2: 97%    Physical Exam Constitutional:      General: He is not in acute distress.     Appearance: Normal appearance. He is not toxic-appearing.  HENT:     Head: Normocephalic and atraumatic.     Mouth/Throat:     Mouth: Mucous membranes are moist.  Eyes:     General: No scleral icterus. Neck:     Comments: Thrush noted Cardiovascular:     Rate and Rhythm: Normal rate and regular rhythm.     Pulses: Normal pulses.     Heart sounds: Normal heart sounds.  Pulmonary:     Effort: Pulmonary effort is normal.     Breath sounds: Normal breath sounds.  Abdominal:     General: Abdomen  is flat. Bowel sounds are normal. There is no distension.     Palpations: Abdomen is soft.     Tenderness: There is no abdominal tenderness.  Musculoskeletal:        General: No swelling.     Cervical back: Neck supple.  Lymphadenopathy:     Cervical: No cervical adenopathy.  Skin:    General: Skin is warm and dry.     Findings: No rash.  Neurological:     General: No focal deficit present.     Mental Status: He is alert.  Psychiatric:        Mood and Affect: Mood normal.        Behavior: Behavior normal.     LABORATORY DATA:  CBC    Component Value Date/Time   WBC 1.9 (L) 08/01/2022 1054   WBC 9.8 05/29/2022 1045   RBC 2.72 (L) 08/01/2022 1054   HGB 8.5 (L) 08/01/2022 1054   HCT 24.9 (L) 08/01/2022 1054   PLT 210 08/01/2022 1054   MCV 91.5 08/01/2022 1054   MCH 31.3 08/01/2022 1054   MCHC 34.1 08/01/2022 1054   RDW 22.3 (H) 08/01/2022 1054   LYMPHSABS 0.2 (L) 08/01/2022 1054   MONOABS 0.4 08/01/2022 1054   EOSABS 0.0 08/01/2022 1054   BASOSABS 0.0 08/01/2022 1054    CMP     Component Value Date/Time   NA 142 07/17/2022 0914   K 4.2 07/17/2022 0914   CL 107 07/17/2022 0914   CO2 30 07/17/2022 0914   GLUCOSE 100 (H) 07/17/2022 0914   BUN 18 07/17/2022 0914   CREATININE 0.99 07/17/2022 0914   CALCIUM 8.7 (L) 07/17/2022 0914   PROT 6.4 (L) 06/10/2022 0853   ALBUMIN 3.6 06/10/2022 0853   AST 45 (H) 06/10/2022 0853   ALT 171 (H) 06/10/2022 0853   ALKPHOS 72  06/10/2022 0853   BILITOT 0.4 06/10/2022 0853   GFRNONAA >60 07/17/2022 0914   GFRAA >60 10/21/2017 2005     ASSESSMENT & PLAN: Patient is a 57 y.o. male returns for a follow up for continued management of SCC tonsil.   #SCC tonsil --Completed chemotherapy radiation, last cycle of chemotherapy on 07/17/2022 --He completed radiation last week as well.  He continues to recover.   -- Most bothersome complaint is increased salivation, fatigue.  He has some mild ringing noise in his ears and mild neuropathy however this does not limit him from any activities of daily living. -- He will return to clinic in 4 weeks. -- He is unable to take any pain medication, he states the pain medication made him feel very loopy.  He is trying to just manage it without pain medication, takes an occasional Tylenol.  Most of his feeding is now through the G-tube.  He understands that if he can maintain his management without using the G-tube for a few weeks, we can have it removed. --Once again we discussed that he will have a PET/CT about 3 months since completion of radiation for disease response assessment.  All questions were answered. The patient knows to call the clinic with any problems, questions or concerns. We can certainly see the patient much sooner if necessary.  Total encounter time:30 minutes*in face-to-face visit time, chart review, lab review, care coordination, order entry, and documentation of the encounter time.    *Total Encounter Time as defined by the Centers for Medicare and Medicaid Services includes, in addition to the face-to-face time of a patient visit (documented in the note above)  non-face-to-face time: obtaining and reviewing outside history, ordering and reviewing medications, tests or procedures, care coordination (communications with other health care professionals or caregivers) and documentation in the medical record.

## 2022-08-02 ENCOUNTER — Other Ambulatory Visit: Payer: Self-pay

## 2022-08-02 ENCOUNTER — Encounter: Payer: Self-pay | Admitting: Hematology and Oncology

## 2022-08-05 ENCOUNTER — Ambulatory Visit: Payer: 59 | Attending: Radiation Oncology | Admitting: Physical Therapy

## 2022-08-05 ENCOUNTER — Ambulatory Visit: Payer: 59 | Attending: Radiation Oncology

## 2022-08-05 DIAGNOSIS — C090 Malignant neoplasm of tonsillar fossa: Secondary | ICD-10-CM | POA: Insufficient documentation

## 2022-08-05 DIAGNOSIS — R293 Abnormal posture: Secondary | ICD-10-CM | POA: Insufficient documentation

## 2022-08-13 ENCOUNTER — Other Ambulatory Visit: Payer: Self-pay

## 2022-08-13 ENCOUNTER — Encounter: Payer: Self-pay | Admitting: Radiation Oncology

## 2022-08-13 ENCOUNTER — Ambulatory Visit
Admission: RE | Admit: 2022-08-13 | Discharge: 2022-08-13 | Disposition: A | Discharge status: Home / Self Care | Payer: 59 | Source: Ambulatory Visit | Attending: Radiation Oncology | Admitting: Radiation Oncology

## 2022-08-13 ENCOUNTER — Inpatient Hospital Stay: Payer: 59 | Admitting: Dietician

## 2022-08-13 VITALS — BP 148/93 | HR 82 | Temp 97.8°F | Resp 17 | Wt 251.2 lb

## 2022-08-13 DIAGNOSIS — C090 Malignant neoplasm of tonsillar fossa: Secondary | ICD-10-CM

## 2022-08-13 DIAGNOSIS — Z923 Personal history of irradiation: Secondary | ICD-10-CM | POA: Diagnosis not present

## 2022-08-13 DIAGNOSIS — Z79899 Other long term (current) drug therapy: Secondary | ICD-10-CM | POA: Insufficient documentation

## 2022-08-13 NOTE — Progress Notes (Signed)
Radiation Oncology         [phone] ________________________________  Name: Aaron Moore MRN: [id number]  Date: 08/13/2022  DOB: [birth year]  Follow-Up Visit Note  CC: Irena Reichmann, DO  Yaakov Guthrie, *  Diagnosis and Prior Radiotherapy:       ICD-10-CM   1. Cancer of tonsillar fossa (HCC)  C09.0      Cancer Staging  Cancer of tonsillar fossa (HCC) Staging form: Pharynx - HPV-Mediated Oropharynx, AJCC 8th Edition - Clinical stage from 05/08/2022: Stage II (cT3, cN1, cM0, p16+) - Signed by Lonie Peak, MD on 05/08/2022 Stage prefix: Initial diagnosis    CHIEF COMPLAINT:  Here for follow-up and surveillance of tonsill cancer.   Narrative:  The patient returns today for routine follow-up.  He completed treatment on 07/24/22.  He is with his significant other and very happy with how he is recovering  ==========DELIVERED PLANS==========  First Treatment Date: 2022-06-03 - Last Treatment Date: 2022-07-24   Plan Name: [email or web address]_[id number]_Tonsil Site: Tonsil, Right Technique: IMRT Mode: Photon Dose Per Fraction: 2 Gy Prescribed Dose (Delivered / Prescribed): 70 Gy / 70 Gy Prescribed Fxs (Delivered / Prescribed): 35 / 35  Pain issues, if any: Denies any mouth or throat pain Using a feeding tube?: Not currently--reports he's been able to eat by mouth for the past week Weight changes, if any:     Wt Readings from Last 3 Encounters:  08/13/22 251 lb 4 oz (114 kg)  08/01/22 253 lb 4.8 oz (114.9 kg)  07/17/22 251 lb 14.4 oz (114.3 kg)    Swallowing issues, if any: Able to tolerate softer foods, or solids with a sauce/gravy. Reports he has to avoid bread like foods Smoking or chewing tobacco? None Using fluoride trays daily? Denies any dental concerns. Using fluoride toothpaste Last ENT visit was on: Not since diagnosis Other notable issues, if any: Continues to deal with fatigue, thick saliva, and altered sense of taste. Reports skin blistered and peeled after finishing treatment, but  is not intact and well healed. F/U with MedOnc scheduled for June   ALLERGIES:  has No Known Allergies.  Meds: Current Outpatient Medications  Medication Sig Dispense Refill   amLODipine-valsartan (EXFORGE) 10-320 MG tablet Take 1 tablet by mouth daily.     dexamethasone (DECADRON) 4 MG tablet Take 2 tablets daily x 3 days starting the day after cisplatin chemotherapy. Take with food. 30 tablet 1   lidocaine-prilocaine (EMLA) cream Apply to affected area once (Patient not taking: Reported on 06/19/2022) 30 g 3   nystatin (MYCOSTATIN) 100000 UNIT/ML suspension Take 5 mLs (500,000 Units total) by mouth 4 (four) times daily. 473 mL 0   ondansetron (ZOFRAN) 8 MG tablet Take 1 tablet (8 mg total) by mouth every 8 (eight) hours as needed for nausea or vomiting. Start on the third day after cisplatin. (Patient not taking: Reported on 06/19/2022) 30 tablet 1   prochlorperazine (COMPAZINE) 10 MG tablet Take 1 tablet (10 mg total) by mouth every 6 (six) hours as needed (Nausea or vomiting). (Patient not taking: Reported on 06/19/2022) 30 tablet 1   sucralfate (CARAFATE) 1 GM/10ML suspension Take 10 mLs (1 g total) by mouth 4 (four) times daily -  with meals and at bedtime. (Patient not taking: Reported on 07/17/2022) 420 mL 0   No current facility-administered medications for this encounter.    Physical Findings: The patient is in no acute distress. Patient is alert and oriented. Wt Readings from Last 3 Encounters:  08/13/22 251 lb 4  oz (114 kg)  08/01/22 253 lb 4.8 oz (114.9 kg)  07/17/22 251 lb 14.4 oz (114.3 kg)    weight is 251 lb 4 oz (114 kg). His oral temperature is 97.8 F (36.6 C). His blood pressure is 148/93 (abnormal) and his pulse is 82. His respiration is 17 and oxygen saturation is 100%. .  General: Alert and oriented, in no acute distress HEENT: Head is normocephalic. Extraocular movements are intact. Oropharynx is notable for no concerning lesions or masses.  Neck: Neck is notable for  no cervical, submandibular, or supraclavicular lymphadenopathy.  Skin: Hyperpigmentation and skin peeling in treatment fields.  Heart: Regular in rate and rhythm with no murmurs, rubs, or gallops. Chest: Clear to auscultation bilaterally, with no rhonchi, wheezes, or rales. Abdomen: Soft, nontender, nondistended, with no rigidity or guarding. Extremities: No cyanosis or edema. Lymphatics: see Neck Exam Psychiatric: Judgment and insight are intact. Affect is appropriate.   Lab Findings: Lab Results  Component Value Date   WBC 1.9 (L) 08/01/2022   HGB 8.5 (L) 08/01/2022   HCT 24.9 (L) 08/01/2022   MCV 91.5 08/01/2022   PLT 210 08/01/2022    Lab Results  Component Value Date   TSH 2.023 05/08/2022    Radiographic Findings: No results found.  Impression/Plan:    1) Head and Neck Cancer Status: Patient is healing very well from from radiation treatment.  Recommended that he continue to moisturize his skin in the treatment fields with a thick lotion or cream.  Follow-up with medical oncology as scheduled.  He will see me back in 2 and half months with restaging PET scan.  2) Nutritional Status: Stable. Meeting with dietician today.  Wt Readings from Last 3 Encounters:  08/13/22 251 lb 4 oz (114 kg)  08/01/22 253 lb 4.8 oz (114.9 kg)  07/17/22 251 lb 14.4 oz (114.3 kg)  PEG tube: In-place, has used once in the past week.   3) Risk Factors: The patient has been educated about risk factors including alcohol and tobacco abuse; they understand that avoidance of alcohol and tobacco is important to prevent recurrences as well as other cancers  4) Swallowing: Stable. Patient is swallowing softer foods or solids with sauce/gravy. He is avoiding dry foods.  5) Dental: Using fluoride toothpaste. Encouraged regular follow up.   6) Thyroid function: Check Annually Lab Results  Component Value Date   TSH 2.023 05/08/2022    8) PET scan in 2.5 months with a follow up appointment to  review results. The patient was encouraged to call with any issues or questions before then.  On date of service, in total, I spent 20 minutes on this encounter. Patient was seen in person. _____________________________________   Joyice Faster, PA-C    Lonie Peak, MD

## 2022-08-13 NOTE — Progress Notes (Signed)
Nutrition Follow-up:  Patient with SCC of right tonsil, P16 positive. He has completed concurrent chemoradiation. Final radiation on 4/24  Met with patient in office. He reports feeling 100% better than he did last week. Patient continues to have thick saliva and altered taste. Patient states water taste really good and drinking a lot of this. He also drinks unsweet tea and chocolate milk. Patient has been increasing oral intake over the last 3 days. Recalls chicken leg and thigh, mac/cheese, pintos for supper. He ate bacon, eggs, fresh baked bread for breakfast. Patient is wanting to have feeding tube removed. He has been giving 2 [employer] via tube for "a little extra" Patient is agreeable to drink these instead of bolus. He will continue daily water flush.    Medications: reviewed   Labs: 5/2 labs reviewed   Anthropometrics: Wt 251 lb 4 oz today - stable   4/17 - 251 lb 14.4 oz 4/10 - 250 lb 4/2 - 260 lb 1.6 oz  3/26 - 262 lb 11.2 oz   NUTRITION DIAGNOSIS: Unintended weight loss stable     INTERVENTION:  Continue strategies for increasing calories and protein, trying a variety of foods/textures as able Pt motivated to have feeding tube removed. Encouraged drinking [employer] 1.4 twice daily vs using feeding tube. He is agreeable to this Continue flushing tube daily with water    MONITORING, EVALUATION, GOAL: weight trends, intake    NEXT VISIT: Wednesday June 5 before MD (pt aware)

## 2022-08-14 ENCOUNTER — Other Ambulatory Visit: Payer: Self-pay

## 2022-08-21 ENCOUNTER — Other Ambulatory Visit: Payer: Self-pay

## 2022-08-21 DIAGNOSIS — C090 Malignant neoplasm of tonsillar fossa: Secondary | ICD-10-CM

## 2022-08-28 ENCOUNTER — Ambulatory Visit (HOSPITAL_COMMUNITY)
Admission: RE | Admit: 2022-08-28 | Discharge: 2022-08-28 | Disposition: A | Discharge status: Home / Self Care | Payer: 59 | Source: Ambulatory Visit | Attending: Hematology and Oncology | Admitting: Hematology and Oncology

## 2022-08-28 DIAGNOSIS — C090 Malignant neoplasm of tonsillar fossa: Secondary | ICD-10-CM

## 2022-08-28 DIAGNOSIS — Z85818 Personal history of malignant neoplasm of other sites of lip, oral cavity, and pharynx: Secondary | ICD-10-CM | POA: Diagnosis not present

## 2022-08-28 DIAGNOSIS — Z431 Encounter for attention to gastrostomy: Secondary | ICD-10-CM | POA: Diagnosis present

## 2022-08-28 HISTORY — PX: IR GASTROSTOMY TUBE REMOVAL: IMG5492

## 2022-08-28 MED ORDER — LIDOCAINE VISCOUS HCL 2 % MT SOLN
OROMUCOSAL | Status: AC
  Filled 2022-08-28: qty 15

## 2022-08-28 MED ORDER — LIDOCAINE VISCOUS HCL 2 % MT SOLN
15.0000 mL | Freq: Once | OROMUCOSAL | Status: AC
  Administered 2022-08-28: 15 mL via OROMUCOSAL

## 2022-08-28 NOTE — Procedures (Signed)
  Successful bedside removal of intact pull through G-tube. No immediate post procedural complications. Gauze dressing placed over site.  Pt provided with care instructions and verbalized understanding of care.  EBL <1cc  Shon Hough, AGNP 08/28/2022 8:50 AM

## 2022-09-04 ENCOUNTER — Other Ambulatory Visit: Payer: Self-pay

## 2022-09-04 ENCOUNTER — Inpatient Hospital Stay: Payer: 59 | Attending: Hematology and Oncology | Admitting: Hematology and Oncology

## 2022-09-04 ENCOUNTER — Inpatient Hospital Stay: Payer: 59

## 2022-09-04 ENCOUNTER — Other Ambulatory Visit: Payer: Self-pay | Admitting: *Deleted

## 2022-09-04 ENCOUNTER — Inpatient Hospital Stay: Payer: 59 | Admitting: Dietician

## 2022-09-04 VITALS — BP 172/89 | HR 98 | Temp 99.3°F | Resp 16 | Ht 73.0 in | Wt 248.1 lb

## 2022-09-04 DIAGNOSIS — C090 Malignant neoplasm of tonsillar fossa: Secondary | ICD-10-CM

## 2022-09-04 DIAGNOSIS — Z8546 Personal history of malignant neoplasm of prostate: Secondary | ICD-10-CM | POA: Insufficient documentation

## 2022-09-04 DIAGNOSIS — Z9221 Personal history of antineoplastic chemotherapy: Secondary | ICD-10-CM | POA: Insufficient documentation

## 2022-09-04 DIAGNOSIS — Z923 Personal history of irradiation: Secondary | ICD-10-CM | POA: Insufficient documentation

## 2022-09-04 DIAGNOSIS — D72829 Elevated white blood cell count, unspecified: Secondary | ICD-10-CM | POA: Diagnosis not present

## 2022-09-04 LAB — COMPREHENSIVE METABOLIC PANEL
ALT: 13 U/L (ref 0–44)
AST: 13 U/L — ABNORMAL LOW (ref 15–41)
Albumin: 4.2 g/dL (ref 3.5–5.0)
Alkaline Phosphatase: 98 U/L (ref 38–126)
Anion gap: 7 (ref 5–15)
BUN: 19 mg/dL (ref 6–20)
CO2: 27 mmol/L (ref 22–32)
Calcium: 9.3 mg/dL (ref 8.9–10.3)
Chloride: 103 mmol/L (ref 98–111)
Creatinine, Ser: 1.19 mg/dL (ref 0.61–1.24)
GFR, Estimated: >60 mL/min (ref >60)
Glucose, Bld: 137 mg/dL — ABNORMAL HIGH (ref 70–99)
Potassium: 4.2 mmol/L (ref 3.5–5.1)
Sodium: 137 mmol/L (ref 135–145)
Total Bilirubin: 0.7 mg/dL (ref 0.3–1.2)
Total Protein: 7.2 g/dL (ref 6.5–8.1)

## 2022-09-04 LAB — CBC WITH DIFFERENTIAL/PLATELET
Abs Immature Granulocytes: 0.04 10*3/uL (ref 0.00–0.07)
Basophils Absolute: 0 10*3/uL (ref 0.0–0.1)
Basophils Relative: 0 %
Eosinophils Absolute: 0.1 10*3/uL (ref 0.0–0.5)
Eosinophils Relative: 1 %
HCT: 34 % — ABNORMAL LOW (ref 39.0–52.0)
Hemoglobin: 11.1 g/dL — ABNORMAL LOW (ref 13.0–17.0)
Immature Granulocytes: 0 %
Lymphocytes Relative: 2 %
Lymphs Abs: 0.3 10*3/uL — ABNORMAL LOW (ref 0.7–4.0)
MCH: 31.1 pg (ref 26.0–34.0)
MCHC: 32.6 g/dL (ref 30.0–36.0)
MCV: 95.2 fL (ref 80.0–100.0)
Monocytes Absolute: 1.2 10*3/uL — ABNORMAL HIGH (ref 0.1–1.0)
Monocytes Relative: 10 %
Neutro Abs: 10.4 10*3/uL — ABNORMAL HIGH (ref 1.7–7.7)
Neutrophils Relative %: 87 %
Platelets: 222 10*3/uL (ref 150–400)
RBC: 3.57 MIL/uL — ABNORMAL LOW (ref 4.22–5.81)
RDW: 15.8 % — ABNORMAL HIGH (ref 11.5–15.5)
WBC: 12 10*3/uL — ABNORMAL HIGH (ref 4.0–10.5)
nRBC: 0 % (ref 0.0–0.2)

## 2022-09-04 NOTE — Progress Notes (Signed)
Nutrition Follow-up:  Patient has completed concurrent chemoradiation for SCC of right tonsil. Final radiation 4/24  Patient arrived late. Met with patient and wife in waiting area. Patient reports tube removed last week. He reports good appetite and eating "anything he wants." Taste has improved. Some foods taste spicy including ketchup. Patient reports tolerating regular textures without difficulty except for breads. Patient continues to have dry mouth. He keeps a bottle of water in his pocket when working outside. Patient does not drink much water when indoors. Wife is making shakes with left over tube feeding formula.    Medications: reviewed   Labs: no new labs  Anthropometrics: Wt 248 lb 1.6 oz today decreased   5/14 - 251 lb 4 oz 4/17 - 251 lb 14.4 oz  4/10 - 250 lb   NUTRITION DIAGNOSIS: Unintended wt loss continues    INTERVENTION:  Encouraged high calorie high protein foods for weight maintenance Continue protein shake daily Encouraged pt to work to increase intake of water    MONITORING, EVALUATION, GOAL: weight trends, intake   NEXT VISIT: To be scheduled as needed

## 2022-09-04 NOTE — Progress Notes (Signed)
[facility] Cancer Follow up:    Aaron Reichmann, DO [address] [place] [address]   DIAGNOSIS:  Cancer Staging  Cancer of tonsillar fossa (HCC) Staging form: Pharynx - HPV-Mediated Oropharynx, AJCC 8th Edition - Clinical stage from 05/08/2022: Stage II (cT3, cN1, cM0, p16+) - Signed by Lonie Peak, MD on 05/08/2022 Stage prefix: Initial diagnosis   SUMMARY OF ONCOLOGIC HISTORY: Oncology History  Cancer of tonsillar fossa (HCC)  05/08/2022 Initial Diagnosis   Cancer of tonsillar fossa (HCC)   05/08/2022 Cancer Staging   Staging form: Pharynx - HPV-Mediated Oropharynx, AJCC 8th Edition - Clinical stage from 05/08/2022: Stage II (cT3, cN1, cM0, p16+) - Signed by Lonie Peak, MD on 05/08/2022 Stage prefix: Initial diagnosis   06/05/2022 -  Chemotherapy   Patient is on Treatment Plan : HEAD/NECK Cisplatin (40) q7d       CURRENT THERAPY: Concurrent chemoradiation  INTERVAL HISTORY:  Aaron Moore 57 y.o. male returns for follow-up. He has been to able to eat better, taste is better. He had his G tube removed last week. He denies any fevers or chills at home, he just says he is physically and mentally tired.  He is willing to go back on his blood pressure medication.  He absolutely denies any fevers or feeling unwell at home.  Overall he is making progress.  He will be having his end of treatment PET/CT in July and follow-up with Dr. Basilio Cairo soon thereafter.  He continues to deal with dry mouth and has been able to manage it by drinking a lot of water.  Eating but it has been a little challenging what he is trying to eat slowly.  He does not have the foreign body sensation in his throat that he originally had.  Rest of the pertinent 10 point ROS reviewed and negative  Wt Readings from Last 3 Encounters:  09/04/22 248 lb 1.6 oz (112.5 kg)  08/13/22 251 lb 4 oz (114 kg)  08/01/22 253 lb 4.8 oz (114.9 kg)     Patient Active Problem List   Diagnosis Date Noted    Port-A-Cath in place 06/04/2022   Cancer of tonsillar fossa (HCC) 05/08/2022   Prostate cancer (HCC) 02/07/2022   Medial epicondylitis of left elbow 03/08/2015   Obesity, Class II, BMI 35-39.9, with comorbidity 11/28/2010   Abdominal wall mass of epigastric region - old hernia sac/lipoma 11/28/2010   Abdominal pain, RLQ - muscle strain 11/28/2010    has No Known Allergies.  MEDICAL HISTORY: Past Medical History:  Diagnosis Date   Abdominal pain    Cancer (HCC)    prostate cancer   Complication of anesthesia    slow to wake up 3 years ago after foot surgery   ED (erectile dysfunction)    Gout    Gout    History of kidney stones    Hypertension    Obesity, Class II, BMI 35-39.9, with comorbidity    Painful orthopaedic hardware (HCC)    left foot    SURGICAL HISTORY: Past Surgical History:  Procedure Laterality Date   amputation of 2 toes on left foot      CARPAL TUNNEL RELEASE  05/03/2011   Procedure: CARPAL TUNNEL RELEASE;  Surgeon: Nicki Reaper, MD;  Location: [facility];  Service: Orthopedics;  Laterality: Left;   FOOT FRACTURE SURGERY Left    x3   HARDWARE REMOVAL Left 06/03/2019   Procedure: HARDWARE REMOVAL;  Surgeon: Terance Hart, MD;  Location:  [facility];  Service: Orthopedics;  Laterality: Left;  PROCEDURE: LEFT FOOT REMOVAL OF HARDWARE, IRRIGATION AND DEBRIDEMENT,PLACEMENT OF CEMENT SPACER, DEBULKIN, SOFT TISSUE MASS LENGTH OF SURGERY: 2 HOURS   HERNIA REPAIR  02/06/2009   Lap supraumb & umb V[facility] repairs   INCISION AND DRAINAGE OF WOUND Left 06/03/2019   Procedure: IRRIGATION AND DEBRIDEMENT WOUND WITH IMPLANTATION OF ANTIBIOTIC CEMENT SPACER;  Surgeon: Terance Hart, MD;  Location: [facility];  Service: Orthopedics;  Laterality: Left;   IR GASTROSTOMY TUBE MOD SED  05/29/2022   IR GASTROSTOMY TUBE REMOVAL  08/28/2022   IR IMAGING GUIDED PORT INSERTION  05/29/2022   left rotator cuff surgery       LYMPHADENECTOMY Bilateral 02/07/2022   Procedure: BILATERAL PELVIC LYMPHADENECTOMY;  Surgeon: Heloise Purpura, MD;  Location: [facility];  Service: Urology;  Laterality: Bilateral;   MASS EXCISION Left 06/03/2019   Procedure: DEBULKING LEFT FOOT MASS;  Surgeon: Terance Hart, MD;  Location: [facility];  Service: Orthopedics;  Laterality: Left;   ROBOT ASSISTED LAPAROSCOPIC RADICAL PROSTATECTOMY N/A 02/07/2022   Procedure: XI ROBOTIC ASSISTED LAPAROSCOPIC RADICAL PROSTATECTOMY LEVEL 3;  Surgeon: Heloise Purpura, MD;  Location: [facility];  Service: Urology;  Laterality: N/A;  210 MINUTES NEEDED FOR CASE    SOCIAL HISTORY: Social History   Socioeconomic History   Marital status: Married    Spouse name: Not on file   Number of children: Not on file   Years of education: Not on file   Highest education level: Not on file  Occupational History   Not on file  Tobacco Use   Smoking status: Never   Smokeless tobacco: Never  Vaping Use   Vaping Use: Never used  Substance and Sexual Activity   Alcohol use: No   Drug use: No   Sexual activity: Not on file  Other Topics Concern   Not on file  Social History Narrative   Not on file   Social Determinants of Health   Financial Resource Strain: Not on file  Food Insecurity: No Food Insecurity (02/07/2022)   Hunger Vital Sign    Worried About Running Out of Food in the Last Year: Never true    Ran Out of Food in the Last Year: Never true  Transportation Needs: No Transportation Needs (02/07/2022)   PRAPARE - Administrator, Civil Service (Medical): No    Lack of Transportation (Non-Medical): No  Physical Activity: Not on file  Stress: Not on file  Social Connections: Not on file  Intimate Partner Violence: Not At Risk (02/07/2022)   Humiliation, Afraid, Rape, and Kick questionnaire    Fear of Current or Ex-Partner: No    Emotionally Abused: No    Physically Abused: No    Sexually Abused: No    FAMILY  HISTORY: Family History  Problem Relation Age of Onset   Diabetes Mother    Diabetes Father     PHYSICAL EXAMINATION  ENT Physical Exam Head and Face Appearance: head appears normal and face appears atraumatic; Oral Cavity/Oropharynx OC/OP comments: No visible mass in the tonsillar area.  No mucositis Neck Neck comments: Thrush noted Respiratory Auscultation: breath sounds are clear; Lymphatic Palpation: no cervical adenopathy noted; Neurovestibular Mental Status: alert; Psychiatric: mood normal;     Vitals:   09/04/22 1523  BP: (!) 172/89  Pulse: 98  Resp: 16  Temp: 99.3 F (37.4 C)  SpO2: 98%     Physical Exam Constitutional:  General: He is not in acute distress.    Appearance: Normal appearance. He is not toxic-appearing.  HENT:     Head: Normocephalic and atraumatic.     Mouth/Throat:     Mouth: Mucous membranes are moist.     Comments: No visible mass in the tonsillar area.  No mucositis Eyes:     General: No scleral icterus. Neck:     Comments: Thrush noted Cardiovascular:     Rate and Rhythm: Normal rate and regular rhythm.     Pulses: Normal pulses.     Heart sounds: Normal heart sounds.  Pulmonary:     Effort: Pulmonary effort is normal.     Breath sounds: Normal breath sounds.  Abdominal:     General: Abdomen is flat. Bowel sounds are normal. There is no distension.     Palpations: Abdomen is soft.     Tenderness: There is no abdominal tenderness.  Musculoskeletal:        General: No swelling.     Cervical back: Neck supple.  Lymphadenopathy:     Cervical: No cervical adenopathy.  Skin:    General: Skin is warm and dry.     Findings: No rash.  Neurological:     General: No focal deficit present.     Mental Status: He is alert.  Psychiatric:        Mood and Affect: Mood normal.        Behavior: Behavior normal.     LABORATORY DATA:  CBC    Component Value Date/Time   WBC 12.0 (H) 09/04/2022 1550   RBC 3.57 (L)  09/04/2022 1550   HGB 11.1 (L) 09/04/2022 1550   HGB 8.5 (L) 08/01/2022 1054   HCT 34.0 (L) 09/04/2022 1550   PLT 222 09/04/2022 1550   PLT 210 08/01/2022 1054   MCV 95.2 09/04/2022 1550   MCH 31.1 09/04/2022 1550   MCHC 32.6 09/04/2022 1550   RDW 15.8 (H) 09/04/2022 1550   LYMPHSABS 0.3 (L) 09/04/2022 1550   MONOABS 1.2 (H) 09/04/2022 1550   EOSABS 0.1 09/04/2022 1550   BASOSABS 0.0 09/04/2022 1550    CMP     Component Value Date/Time   NA 137 09/04/2022 1550   K 4.2 09/04/2022 1550   CL 103 09/04/2022 1550   CO2 27 09/04/2022 1550   GLUCOSE 137 (H) 09/04/2022 1550   BUN 19 09/04/2022 1550   CREATININE 1.19 09/04/2022 1550   CREATININE 1.02 08/01/2022 1054   CALCIUM 9.3 09/04/2022 1550   PROT 7.2 09/04/2022 1550   ALBUMIN 4.2 09/04/2022 1550   AST 13 (L) 09/04/2022 1550   AST 13 (L) 08/01/2022 1054   ALT 13 09/04/2022 1550   ALT 14 08/01/2022 1054   ALKPHOS 98 09/04/2022 1550   BILITOT 0.7 09/04/2022 1550   BILITOT 0.5 08/01/2022 1054   GFRNONAA >60 09/04/2022 1550   GFRNONAA >60 08/01/2022 1054   GFRAA >60 10/21/2017 2005     ASSESSMENT & PLAN: Patient is a 57 y.o. male returns for a follow up for continued management of SCC tonsil.   #SCC tonsil --Completed chemotherapy radiation, last cycle of chemotherapy on 07/17/2022 -- He is here for follow-up, making good recovery.  He had his G-tube removed last week.  He is slowly improving although he continues to have some ongoing fatigue and dry mouth.--Once again we discussed that he will have a PET/CT about 3 months since completion of radiation for disease response assessment. I also recommended that we repeat labs  today since his last labs showed leukopenia and severe anemia.  There seems to be no evidence of disease on clinical exam, he had complete clinical response. There is also no evidence of active infection since he does not complain of anything except for the low-grade fever.  However his CBC today shows  leukocytosis and absolute neutrophilia.  Since there is no review of system pointing to a particular source of infection, I have asked him to continue monitoring and reach out back to [place] if his temperature goes over 100.5 degrees.   We will also try to follow-up later this week to see if he is doing okay.  All questions were answered. The patient knows to call the clinic with any problems, questions or concerns. We can certainly see the patient much sooner if necessary.  Total encounter time:30 minutes*in face-to-face visit time, chart review, lab review, care coordination, order entry, and documentation of the encounter time.  *Total Encounter Time as defined by the Centers for Medicare and Medicaid Services includes, in addition to the face-to-face time of a patient visit (documented in the note above) non-face-to-face time: obtaining and reviewing outside history, ordering and reviewing medications, tests or procedures, care coordination (communications with other health care professionals or caregivers) and documentation in the medical record.

## 2022-09-04 NOTE — Progress Notes (Deleted)
[facility] Cancer Follow up:    Aaron Reichmann, Aaron Moore [address] [place] [address] [place] [address]   DIAGNOSIS:  Cancer Staging  Cancer of tonsillar fossa (HCC) Staging form: Pharynx - HPV-Mediated Oropharynx, AJCC 8th Edition - Clinical stage from 05/08/2022: Stage II (cT3, cN1, cM0, p16+) - Signed by Lonie Peak, MD on 05/08/2022 Stage prefix: Initial diagnosis   SUMMARY OF ONCOLOGIC HISTORY: Oncology History  Cancer of tonsillar fossa (HCC)  05/08/2022 Initial Diagnosis   Cancer of tonsillar fossa (HCC)   05/08/2022 Cancer Staging   Staging form: Pharynx - HPV-Mediated Oropharynx, AJCC 8th Edition - Clinical stage from 05/08/2022: Stage II (cT3, cN1, cM0, p16+) - Signed by Lonie Peak, MD on 05/08/2022 Stage prefix: Initial diagnosis   06/05/2022 -  Chemotherapy   Patient is on Treatment Plan : HEAD/NECK Cisplatin (40) q7d       CURRENT THERAPY: Concurrent chemoradiation  INTERVAL HISTORY:  Aaron Moore 57 y.o. male returns for follow-up. He has been to able to eat better, taste is better. He had his G tube removed last week. He denies any fevers or chills at home, he just says  Wt Readings from Last 3 Encounters:  08/13/22 251 lb 4 oz (114 kg)  08/01/22 253 lb 4.8 oz (114.9 kg)  07/17/22 251 lb 14.4 oz (114.3 kg)     Patient Active Problem List   Diagnosis Date Noted   Port-A-Cath in place 06/04/2022   Cancer of tonsillar fossa (HCC) 05/08/2022   Prostate cancer (HCC) 02/07/2022   Medial epicondylitis of left elbow 03/08/2015   Obesity, Class II, BMI 35-39.9, with comorbidity 11/28/2010   Abdominal wall mass of epigastric region - old hernia sac/lipoma 11/28/2010   Abdominal pain, RLQ - muscle strain 11/28/2010    has No Known Allergies.  MEDICAL HISTORY: Past Medical History:  Diagnosis Date   Abdominal pain    Cancer (HCC)    prostate cancer   Complication of anesthesia    slow to wake up 3 years ago after foot surgery   ED (erectile  dysfunction)    Gout    Gout    History of kidney stones    Hypertension    Obesity, Class II, BMI 35-39.9, with comorbidity    Painful orthopaedic hardware (HCC)    left foot    SURGICAL HISTORY: Past Surgical History:  Procedure Laterality Date   amputation of 2 toes on left foot      CARPAL TUNNEL RELEASE  05/03/2011   Procedure: CARPAL TUNNEL RELEASE;  Surgeon: Nicki Reaper, MD;  Location: [facility];  Service: Orthopedics;  Laterality: Left;   FOOT FRACTURE SURGERY Left    x3   HARDWARE REMOVAL Left 06/03/2019   Procedure: HARDWARE REMOVAL;  Surgeon: Terance Hart, MD;  Location: [facility];  Service: Orthopedics;  Laterality: Left;  PROCEDURE: LEFT FOOT REMOVAL OF HARDWARE, IRRIGATION AND DEBRIDEMENT,PLACEMENT OF CEMENT SPACER, DEBULKIN, SOFT TISSUE MASS LENGTH OF SURGERY: 2 HOURS   HERNIA REPAIR  02/06/2009   Lap supraumb & umb VWH repairs   INCISION AND DRAINAGE OF WOUND Left 06/03/2019   Procedure: IRRIGATION AND DEBRIDEMENT WOUND WITH IMPLANTATION OF ANTIBIOTIC CEMENT SPACER;  Surgeon: Terance Hart, MD;  Location: [facility];  Service: Orthopedics;  Laterality: Left;   IR GASTROSTOMY TUBE MOD SED  05/29/2022   IR GASTROSTOMY TUBE REMOVAL  08/28/2022   IR IMAGING GUIDED PORT INSERTION  05/29/2022   left rotator cuff surgery  LYMPHADENECTOMY Bilateral 02/07/2022   Procedure: BILATERAL PELVIC LYMPHADENECTOMY;  Surgeon: Heloise Purpura, MD;  Location: [facility];  Service: Urology;  Laterality: Bilateral;   MASS EXCISION Left 06/03/2019   Procedure: DEBULKING LEFT FOOT MASS;  Surgeon: Terance Hart, MD;  Location: [facility];  Service: Orthopedics;  Laterality: Left;   ROBOT ASSISTED LAPAROSCOPIC RADICAL PROSTATECTOMY N/A 02/07/2022   Procedure: XI ROBOTIC ASSISTED LAPAROSCOPIC RADICAL PROSTATECTOMY LEVEL 3;  Surgeon: Heloise Purpura, MD;  Location: [facility];  Service: Urology;  Laterality: N/A;  210  MINUTES NEEDED FOR CASE    SOCIAL HISTORY: Social History   Socioeconomic History   Marital status: Married    Spouse name: Not on file   Number of children: Not on file   Years of education: Not on file   Highest education level: Not on file  Occupational History   Not on file  Tobacco Use   Smoking status: Never   Smokeless tobacco: Never  Vaping Use   Vaping Use: Never used  Substance and Sexual Activity   Alcohol use: No   Drug use: No   Sexual activity: Not on file  Other Topics Concern   Not on file  Social History Narrative   Not on file   Social Determinants of Health   Financial Resource Strain: Not on file  Food Insecurity: No Food Insecurity (02/07/2022)   Hunger Vital Sign    Worried About Running Out of Food in the Last Year: Never true    Ran Out of Food in the Last Year: Never true  Transportation Needs: No Transportation Needs (02/07/2022)   PRAPARE - Administrator, Civil Service (Medical): No    Lack of Transportation (Non-Medical): No  Physical Activity: Not on file  Stress: Not on file  Social Connections: Not on file  Intimate Partner Violence: Not At Risk (02/07/2022)   Humiliation, Afraid, Rape, and Kick questionnaire    Fear of Current or Ex-Partner: No    Emotionally Abused: No    Physically Abused: No    Sexually Abused: No    FAMILY HISTORY: Family History  Problem Relation Age of Onset   Diabetes Mother    Diabetes Father     PHYSICAL EXAMINATION     There were no vitals filed for this visit.   Physical Exam Constitutional:      General: He is not in acute distress.    Appearance: Normal appearance. He is not toxic-appearing.  HENT:     Head: Normocephalic and atraumatic.     Mouth/Throat:     Mouth: Mucous membranes are moist.  Eyes:     General: No scleral icterus. Neck:     Comments: Thrush noted Cardiovascular:     Rate and Rhythm: Normal rate and regular rhythm.     Pulses: Normal pulses.      Heart sounds: Normal heart sounds.  Pulmonary:     Effort: Pulmonary effort is normal.     Breath sounds: Normal breath sounds.  Abdominal:     General: Abdomen is flat. Bowel sounds are normal. There is no distension.     Palpations: Abdomen is soft.     Tenderness: There is no abdominal tenderness.  Musculoskeletal:        General: No swelling.     Cervical back: Neck supple.  Lymphadenopathy:     Cervical: No cervical adenopathy.  Skin:    General: Skin is warm and dry.     Findings: No rash.  Neurological:     General: No focal deficit present.     Mental Status: He is alert.  Psychiatric:        Mood and Affect: Mood normal.        Behavior: Behavior normal.     LABORATORY DATA:  CBC    Component Value Date/Time   WBC 1.9 (L) 08/01/2022 1054   WBC 9.8 05/29/2022 1045   RBC 2.72 (L) 08/01/2022 1054   HGB 8.5 (L) 08/01/2022 1054   HCT 24.9 (L) 08/01/2022 1054   PLT 210 08/01/2022 1054   MCV 91.5 08/01/2022 1054   MCH 31.3 08/01/2022 1054   MCHC 34.1 08/01/2022 1054   RDW 22.3 (H) 08/01/2022 1054   LYMPHSABS 0.2 (L) 08/01/2022 1054   MONOABS 0.4 08/01/2022 1054   EOSABS 0.0 08/01/2022 1054   BASOSABS 0.0 08/01/2022 1054    CMP     Component Value Date/Time   NA 142 08/01/2022 1054   K 4.3 08/01/2022 1054   CL 107 08/01/2022 1054   CO2 28 08/01/2022 1054   GLUCOSE 123 (H) 08/01/2022 1054   BUN 27 (H) 08/01/2022 1054   CREATININE 1.02 08/01/2022 1054   CALCIUM 8.8 (L) 08/01/2022 1054   PROT 6.6 08/01/2022 1054   ALBUMIN 3.9 08/01/2022 1054   AST 13 (L) 08/01/2022 1054   ALT 14 08/01/2022 1054   ALKPHOS 78 08/01/2022 1054   BILITOT 0.5 08/01/2022 1054   GFRNONAA >60 08/01/2022 1054   GFRAA >60 10/21/2017 2005     ASSESSMENT & PLAN: Patient is a 57 y.o. male returns for a follow up for continued management of SCC tonsil.   #SCC tonsil --Completed chemotherapy radiation, last cycle of chemotherapy on 07/17/2022 --He completed radiation last week as  well.  He continues to recover.   -- Most bothersome complaint is increased salivation, fatigue.  He has some mild ringing noise in his ears and mild neuropathy however this does not limit him from any activities of daily living. -- He will return to clinic in 4 weeks. -- He is unable to take any pain medication, he states the pain medication made him feel very loopy.  He is trying to just manage it without pain medication, takes an occasional Tylenol.  Most of his feeding is now through the G-tube.  He understands that if he can maintain his management without using the G-tube for a few weeks, we can have it removed. --Once again we discussed that he will have a PET/CT about 3 months since completion of radiation for disease response assessment.  All questions were answered. The patient knows to call the clinic with any problems, questions or concerns. We can certainly see the patient much sooner if necessary.  Total encounter time:30 minutes*in face-to-face visit time, chart review, lab review, care coordination, order entry, and documentation of the encounter time.    *Total Encounter Time as defined by the Centers for Medicare and Medicaid Services includes, in addition to the face-to-face time of a patient visit (documented in the note above) non-face-to-face time: obtaining and reviewing outside history, ordering and reviewing medications, tests or procedures, care coordination (communications with other health care professionals or caregivers) and documentation in the medical record.

## 2022-09-05 ENCOUNTER — Telehealth: Payer: Self-pay | Admitting: Hematology and Oncology

## 2022-09-05 NOTE — Telephone Encounter (Signed)
Left patient a vm regarding upcoming appointment  

## 2022-09-06 ENCOUNTER — Encounter: Payer: Self-pay | Admitting: Hematology and Oncology

## 2022-09-06 ENCOUNTER — Other Ambulatory Visit: Payer: Self-pay

## 2022-10-14 ENCOUNTER — Telehealth: Payer: Self-pay | Admitting: *Deleted

## 2022-10-14 NOTE — Telephone Encounter (Signed)
CALLED PATIENT'S WIFE- LORETTA Gazda TO INFORM OF PET SCAN FOR 10-22-22- ARRIVAL TIME- 7 AM @ [facility], PATIENT TO HAVE WATER ONLY- 6 HRS. PRIOR TO TEST, PATIENT TO RECEIVE RESULTS FROM DR. SQUIRE ON 10-25-22 @ 3 PM, LVM FOR A RETURN CALL

## 2022-10-14 NOTE — Progress Notes (Signed)
Mr. Vanterpool presents to clinic today for follow up for completion of radiation treatment of tonsill cancer. He completed treatment on 07-24-22. Recently this pt reported a chest mass that appeared. A PET scan was scheduled for 10-22-22 to evaluate this for malignancy.   Pain issues, if any: Denies any mouth or throat pain Using a feeding tube?: N/A--removed 08/28/22 Weight changes, if any: Reports a decreased appetite Wt Readings from Last 3 Encounters:  10/25/22 248 lb 4 oz (112.6 kg)  09/04/22 248 lb 1.6 oz (112.5 kg)  08/13/22 251 lb 4 oz (114 kg)   Swallowing issues, if any: Reports this is slowly improving. Continues to struggle with bread and carbonated beverages.  Smoking or chewing tobacco? None Using fluoride toothpaste daily? Yes--deneis any new mouth sores or dental concerns Last ENT visit was on: Not since diagnosis Other notable issues, if any: Continues to deal with dry mouth, thick saliva, and altered sense of taste. Skin appears intact and well healed. Denies any new swelling or lumps under chin or down neck. Last saw his medical oncologist Dr. Al Pimple on 09/04/2022  NM PET Image Restag (PS) Skull Base To Thigh 10/22/2022  IMPRESSION: 1. Complete metabolic response to treatment of the right tonsillar mass. No residual measurable disease or hypermetabolism and no cervical lymphadenopathy. 2. No findings for metastatic disease involving the chest, abdomen/pelvis or bony structures. 3. Metallic radiopaque foreign body along the greater curvature of the stomach. This may be related to a prior feeding tube placement. It may be some type of vascular clip. 4. Bilateral renal calculi and renal cortical scarring changes. 5. Stable age advanced vascular disease.

## 2022-10-22 ENCOUNTER — Encounter
Admission: RE | Admit: 2022-10-22 | Discharge: 2022-10-22 | Disposition: A | Discharge status: Home / Self Care | Payer: 59 | Source: Ambulatory Visit | Attending: Radiation Oncology | Admitting: Radiation Oncology

## 2022-10-22 DIAGNOSIS — C090 Malignant neoplasm of tonsillar fossa: Secondary | ICD-10-CM | POA: Diagnosis not present

## 2022-10-22 LAB — GLUCOSE, CAPILLARY: Glucose-Capillary: 87 mg/dL (ref 70–99)

## 2022-10-22 MED ORDER — FLUDEOXYGLUCOSE F - 18 (FDG) INJECTION
12.0000 | Freq: Once | INTRAVENOUS | Status: AC
  Administered 2022-10-22: 12.63 via INTRAVENOUS

## 2022-10-25 ENCOUNTER — Other Ambulatory Visit: Payer: Self-pay

## 2022-10-25 ENCOUNTER — Ambulatory Visit
Admission: RE | Admit: 2022-10-25 | Discharge: 2022-10-25 | Disposition: A | Discharge status: Home / Self Care | Payer: 59 | Source: Ambulatory Visit | Attending: Radiation Oncology | Admitting: Radiation Oncology

## 2022-10-25 ENCOUNTER — Encounter: Payer: Self-pay | Admitting: Radiation Oncology

## 2022-10-25 VITALS — BP 146/87 | HR 81 | Temp 98.0°F | Resp 18 | Ht 73.0 in | Wt 248.2 lb

## 2022-10-25 DIAGNOSIS — C090 Malignant neoplasm of tonsillar fossa: Secondary | ICD-10-CM

## 2022-10-25 DIAGNOSIS — N200 Calculus of kidney: Secondary | ICD-10-CM | POA: Diagnosis not present

## 2022-10-25 DIAGNOSIS — M47816 Spondylosis without myelopathy or radiculopathy, lumbar region: Secondary | ICD-10-CM | POA: Diagnosis not present

## 2022-10-25 DIAGNOSIS — Z923 Personal history of irradiation: Secondary | ICD-10-CM | POA: Diagnosis not present

## 2022-10-25 NOTE — Progress Notes (Addendum)
Radiation Oncology         [phone] ________________________________  Name: Aaron Moore MRN: [id number]  Date: 10/25/2022  DOB: 05-14-1965  Follow-Up Visit Note  CC: Irena Reichmann, DO  Yaakov Guthrie, *  Diagnosis and Prior Radiotherapy:       ICD-10-CM   1. Cancer of tonsillar fossa (HCC)  C09.0       Cancer Staging  Cancer of tonsillar fossa (HCC) Staging form: Pharynx - HPV-Mediated Oropharynx, AJCC 8th Edition - Clinical stage from 05/08/2022: Stage II (cT3, cN1, cM0, p16+) - Signed by Lonie Peak, MD on 05/08/2022 Stage prefix: Initial diagnosis    CHIEF COMPLAINT:  Here for follow-up and surveillance of tonsill cancer.   ==========DELIVERED PLANS==========  First Treatment Date: 2022-06-03 - Last Treatment Date: 2022-07-24   Plan Name: [id number]_Tonsil Site: Tonsil, Right Technique: IMRT Mode: Photon Dose Per Fraction: 2 Gy Prescribed Dose (Delivered / Prescribed): 70 Gy / 70 Gy Prescribed Fxs (Delivered / Prescribed): 35 / 35   Narrative:  The patient returns today for routine follow-up.  He completed treatment on 07/24/22.  He is with his significant other and very happy with how he is recovering  Aaron Moore presents to clinic today for follow up for completion of radiation treatment of tonsill cancer. He completed treatment on 07-24-22. Recently this pt reported a chest mass that appeared. A PET scan was scheduled for 10-22-22 to evaluate this for malignancy.   Pain issues, if any: Denies any mouth or throat pain Using a feeding tube?: N/A--removed 08/28/22 Weight changes, if any: Reports a decreased appetite Wt Readings from Last 3 Encounters:  10/25/22 248 lb 4 oz (112.6 kg)  09/04/22 248 lb 1.6 oz (112.5 kg)  08/13/22 251 lb 4 oz (114 kg)   Swallowing issues, if any: Reports this is slowly improving. Continues to struggle with bread and carbonated beverages.  Smoking or chewing tobacco? None Using fluoride toothpaste daily? Yes--deneis any new mouth  sores or dental concerns Last ENT visit was on: Not since diagnosis Other notable issues, if any: Continues to deal with dry mouth, thick saliva, and altered sense of taste. Skin appears intact and well healed. Denies any new swelling or lumps under chin or down neck. Last saw his medical oncologist Dr. Al Pimple on 09/04/2022  NM PET Image Restag (PS) Skull Base To Thigh 10/22/2022  IMPRESSION: 1. Complete metabolic response to treatment of the right tonsillar mass. No residual measurable disease or hypermetabolism and no cervical lymphadenopathy. 2. No findings for metastatic disease involving the chest, abdomen/pelvis or bony structures. 3. Metallic radiopaque foreign body along the greater curvature of the stomach. This may be related to a prior feeding tube placement. It may be some type of vascular clip. 4. Bilateral renal calculi and renal cortical scarring changes. 5. Stable age advanced vascular disease.    ALLERGIES:  has No Known Allergies.  Meds: No current outpatient medications on file.   No current facility-administered medications for this encounter.    Physical Findings: The patient is in no acute distress. Patient is alert and oriented. Wt Readings from Last 3 Encounters:  10/25/22 248 lb 4 oz (112.6 kg)  09/04/22 248 lb 1.6 oz (112.5 kg)  08/13/22 251 lb 4 oz (114 kg)    height is 6\' 1"  (1.854 m) and weight is 248 lb 4 oz (112.6 kg). His temporal temperature is 98 F (36.7 C). His blood pressure is 146/87 (abnormal) and his pulse is 81. His respiration is 18 and oxygen  saturation is 99%. .   General: Alert and oriented, in no acute distress HEENT: Head is normocephalic. Extraocular movements are intact. Oropharynx is notable for no concerning lesions or masses.  Neck: Neck is notable for no cervical, submandibular, or supraclavicular lymphadenopathy.  Skin: well healed over neck Extremities: No cyanosis or edema. Lymphatics: see Neck Exam Psychiatric: Judgment  and insight are intact. Affect is appropriate.   Lab Findings: Lab Results  Component Value Date   WBC 12.0 (H) 09/04/2022   HGB 11.1 (L) 09/04/2022   HCT 34.0 (L) 09/04/2022   MCV 95.2 09/04/2022   PLT 222 09/04/2022    Lab Results  Component Value Date   TSH 2.023 05/08/2022    Radiographic Findings: NM PET Image Restag (PS) Skull Base To Thigh  Result Date: 10/24/2022 CLINICAL DATA:  Subsequent treatment strategy for head/neck cancer. EXAM: NUCLEAR MEDICINE PET SKULL BASE TO THIGH TECHNIQUE: 12.63 mCi F-18 FDG was injected intravenously. Full-ring PET imaging was performed from the skull base to thigh after the radiotracer. CT data was obtained and used for attenuation correction and anatomic localization. Fasting blood glucose: 87 mg/dl COMPARISON:  Outside PET-CT from [facility] dated 05/04/2022 FINDINGS: Mediastinal blood pool activity: SUV max 2.14 Liver activity: SUV max NA NECK: The large hypermetabolic right tonsillar mass has completely resolved. No residual measurable disease and no hypermetabolism consistent with a complete metabolic response. No cervical lymphadenopathy. Incidental CT findings: None. CHEST: No hypermetabolic mediastinal or hilar nodes. No suspicious pulmonary nodules on the CT scan. Incidental CT findings: The right IJ Port-A-Cath is in good position without complicating features. Stable age advanced coronary artery calcifications. ABDOMEN/PELVIS: No abnormal hypermetabolic activity within the liver, pancreas, adrenal glands, or spleen. No hypermetabolic lymph nodes in the abdomen or pelvis. Incidental CT findings: There is a metallic radiopaque foreign body noted along the greater curvature of the stomach. This was not present on the prior PET-CT but may be related to a feeding gastrostomy tube that was placed in February and since removed. It may be some type of vascular clip. Recommend clinical correlation. Bilateral renal calculi and renal cortical scarring  changes. Stable surgical changes anterior abdominal hernia repair with mesh. Thick wall bladder likely due to lack of distension. The prostate gland and seminal vesicles are surgically absent. SKELETON: No findings suspicious for osseous metastatic disease. Incidental CT findings: Advanced lower lumbar facet disease. IMPRESSION: 1. Complete metabolic response to treatment of the right tonsillar mass. No residual measurable disease or hypermetabolism and no cervical lymphadenopathy. 2. No findings for metastatic disease involving the chest, abdomen/pelvis or bony structures. 3. Metallic radiopaque foreign body along the greater curvature of the stomach. This may be related to a prior feeding tube placement. It may be some type of vascular clip. 4. Bilateral renal calculi and renal cortical scarring changes. 5. Stable age advanced vascular disease. Electronically Signed   By: Rudie Meyer M.D.   On: 10/24/2022 08:56    Impression/Plan:    1) Head and Neck Cancer Status: I personally reviewed his PET scan images with him and his wife.  He has had a complete response to treatment.  Our patient navigator will contact radiology and interventional radiology about the incidental finding of the radiopaque foreign body in his stomach to see if this can be explained by his PEG tube removal and to verify if any further follow-up is needed  2) Nutritional Status: Stable, PEG has been removed Wt Readings from Last 3 Encounters:  10/25/22 248 lb 4  oz (112.6 kg)  09/04/22 248 lb 1.6 oz (112.5 kg)  08/13/22 251 lb 4 oz (114 kg)    3) Risk Factors: The patient has been educated about risk factors including alcohol and tobacco abuse; they understand that avoidance of alcohol and tobacco is important to prevent recurrences as well as other cancers  4) Swallowing: He declined speech-language pathology AGAINST MEDICAL ADVICE.  He is noticing that water goes through his nose if he swallows quickly.  Advised him to swallow  more slowly and to let us know if he develops other issues  5) Dental: Using fluoride toothpaste and a Waterpik. Encouraged regular follow up with dentistry.   6) Thyroid function: Check Annually Lab Results  Component Value Date   TSH 2.023 05/08/2022    8) I will see him back in 6 months for physical exam.  The patient was encouraged to call with any issues or questions before then.  On date of service, in total, I spent 30 minutes on this encounter. Patient was seen in person. _____________________________________   Joyice Faster, PA-C    Lonie Peak, MD

## 2022-10-28 ENCOUNTER — Other Ambulatory Visit: Payer: Self-pay

## 2022-10-28 DIAGNOSIS — C090 Malignant neoplasm of tonsillar fossa: Secondary | ICD-10-CM

## 2022-10-28 NOTE — Progress Notes (Signed)
TSH ordered for 6 month follow up with Dr. Basilio Cairo.

## 2022-10-29 ENCOUNTER — Ambulatory Visit: Payer: Self-pay | Admitting: Radiation Oncology

## 2022-11-01 ENCOUNTER — Other Ambulatory Visit: Payer: Self-pay | Admitting: Radiology

## 2022-11-04 ENCOUNTER — Ambulatory Visit (HOSPITAL_COMMUNITY)
Admission: RE | Admit: 2022-11-04 | Discharge: 2022-11-04 | Disposition: A | Discharge status: Home / Self Care | Payer: 59 | Source: Ambulatory Visit | Attending: Hematology and Oncology | Admitting: Hematology and Oncology

## 2022-11-04 DIAGNOSIS — Z452 Encounter for adjustment and management of vascular access device: Secondary | ICD-10-CM | POA: Insufficient documentation

## 2022-11-04 DIAGNOSIS — C090 Malignant neoplasm of tonsillar fossa: Secondary | ICD-10-CM

## 2022-11-04 HISTORY — PX: IR REMOVAL TUN ACCESS W/ PORT W/O FL MOD SED: IMG2290

## 2022-11-04 MED ORDER — LIDOCAINE-EPINEPHRINE 1 %-1:100000 IJ SOLN
INTRAMUSCULAR | Status: AC
  Filled 2022-11-04: qty 1

## 2022-11-04 MED ORDER — LIDOCAINE-EPINEPHRINE 1 %-1:100000 IJ SOLN
20.0000 mL | Freq: Once | INTRAMUSCULAR | Status: AC
  Administered 2022-11-04: 10 mL via INTRADERMAL

## 2022-11-04 NOTE — Procedures (Signed)
  Procedure:  R chest port removal   Preprocedure diagnosis: The encounter diagnosis was Cancer of tonsillar fossa (HCC). Postprocedure diagnosis: same EBL:    minimal Complications:   none immediate  See full dictation in [employer].  Thora Lance MD Main # [phone] Pager  [phone] Mobile [phone]

## 2022-11-12 NOTE — Progress Notes (Signed)
Oncology Nurse Navigator Documentation   At Dr. Colletta Maryland request I contacted Dr. Oley Balm in Radiology about Mr. Soltani recent PET scan showing metallic radiopaque foreign body along the greater curvature of the stomach which may have been related to a prior feeding tube placment or some type of vascular clip. Dr. Deanne Coffer messaged back saying that it is the T-tack used at the time of his gastrostomy placement. He said they generally advance on their own out the alimentary canal. He said there was no evidence of any complicating features.  I called and spoke with Mr. Flint wife and explained the above so that she is aware. She knows to call me if she has any further questions or concerns.  Hedda Slade RN, BSN, OCN Head & Neck Oncology Nurse Navigator [facility] Phone # [phone]  Fax # [phone]

## 2023-02-05 ENCOUNTER — Other Ambulatory Visit: Payer: Self-pay

## 2023-02-11 ENCOUNTER — Other Ambulatory Visit (HOSPITAL_BASED_OUTPATIENT_CLINIC_OR_DEPARTMENT_OTHER): Payer: Self-pay

## 2023-02-11 MED ORDER — HYDROCODONE-ACETAMINOPHEN 5-325 MG PO TABS
1.0000 | ORAL_TABLET | ORAL | 0 refills | Status: DC | PRN
  Filled 2023-02-11: qty 20, 4d supply, fill #0

## 2023-02-15 ENCOUNTER — Other Ambulatory Visit (HOSPITAL_BASED_OUTPATIENT_CLINIC_OR_DEPARTMENT_OTHER): Payer: Self-pay

## 2023-02-15 MED ORDER — AMOXICILLIN-POT CLAVULANATE 875-125 MG PO TABS
ORAL_TABLET | ORAL | 0 refills | Status: DC
  Filled 2023-02-15: qty 14, 7d supply, fill #0

## 2023-04-30 NOTE — Progress Notes (Signed)
Mr. Aaron Moore presents to the clinic for a 6 month follow up after completing radiation treatment for cancer on the tonsillar fossa on 07/17/2022.   Pain issues, if any? Patient has been experiencing throat soreness. Patients says it is painful to swallow. Using a feeding tube?: Patient is not using a feeding tube. He takes everything by mouth. Weight changes, if any: Patient gained 22 pounds. Swallowing issues, if any: Occasional  Smoking or chewing tobacco? None Using fluoride toothpaste daily? Yes Last ENT visit was on: December 2024. Other notable issues, if any: None    BP (!) 165/91 (BP Location: Right Arm, Patient Position: Sitting, Cuff Size: Large)   Pulse 81   Temp 97.9 F (36.6 C)   Resp 20   Ht 6\' 1"  (1.854 m)   Wt 269 lb 9.6 oz (122.3 kg)   SpO2 99%   BMI 35.57 kg/m    Filed Weights   05/02/23 1516  Weight: 269 lb 9.6 oz (122.3 kg)

## 2023-05-02 ENCOUNTER — Ambulatory Visit
Admission: RE | Admit: 2023-05-02 | Discharge: 2023-05-02 | Disposition: A | Discharge status: Home / Self Care | Payer: 59 | Source: Ambulatory Visit | Attending: Radiation Oncology | Admitting: Radiation Oncology

## 2023-05-02 VITALS — BP 165/91 | HR 81 | Temp 97.9°F | Resp 20 | Ht 73.0 in | Wt 269.6 lb

## 2023-05-02 DIAGNOSIS — C090 Malignant neoplasm of tonsillar fossa: Secondary | ICD-10-CM | POA: Insufficient documentation

## 2023-05-02 DIAGNOSIS — Z79899 Other long term (current) drug therapy: Secondary | ICD-10-CM | POA: Diagnosis not present

## 2023-05-02 LAB — TSH: TSH: 4.336 u[IU]/mL (ref 0.350–4.500)

## 2023-05-02 NOTE — Progress Notes (Signed)
Radiation Oncology         [phone] ________________________________  Name: Aaron Moore MRN: [id number]  Date: 05/02/2023  DOB: November 29, 1965  Follow-Up Visit Note  CC: Aaron Reichmann, DO  Aaron Moore, *  Diagnosis and Prior Radiotherapy:     No diagnosis found.   Cancer Staging  Cancer of tonsillar fossa (HCC) Staging form: Pharynx - HPV-Mediated Oropharynx, AJCC 8th Edition - Clinical stage from 05/08/2022: Stage II (cT3, cN1, cM0, p16+) - Signed by Aaron Peak, MD on 05/08/2022 Stage prefix: Initial diagnosis    CHIEF COMPLAINT:  Here for follow-up and surveillance of tonsill cancer.   ==========DELIVERED PLANS==========  First Treatment Date: 2022-06-03 - Last Treatment Date: 2022-07-24   Plan Name: HN_R_Tonsil Site: Tonsil, Right Technique: IMRT Mode: Photon Dose Per Fraction: 2 Gy Prescribed Dose (Delivered / Prescribed): 70 Gy / 70 Gy Prescribed Fxs (Delivered / Prescribed): 35 / 35   Narrative:  The patient returns today for routine follow-up.  He completed treatment on 07/24/22.  He is with his significant other and very happy with how he is recovering  [name] presents to clinic today for follow up for completion of radiation treatment of tonsill cancer. He completed treatment on 07-24-22. Recently this pt reported a chest mass that appeared. A PET scan was scheduled for 10-22-22 to evaluate this for malignancy.   Pain issues, if any: Denies any mouth or throat pain Using a feeding tube?: N/A--removed 08/28/22 Weight changes, if any: Reports a decreased appetite Wt Readings from Last 3 Encounters:  05/02/23 269 lb 9.6 oz (122.3 kg)  10/25/22 248 lb 4 oz (112.6 kg)  09/04/22 248 lb 1.6 oz (112.5 kg)   Swallowing issues, if any: Reports this is slowly improving. Continues to struggle with bread and carbonated beverages.  Smoking or chewing tobacco? None Using fluoride toothpaste daily? Yes--deneis any new mouth sores or dental concerns Last ENT visit  was on: Not since diagnosis Other notable issues, if any: Continues to deal with dry mouth, thick saliva, and altered sense of taste. Skin appears intact and well healed. Denies any new swelling or lumps under chin or down neck. Last saw his medical oncologist Dr. Al Moore on 09/04/2022  NM PET Image Restag (PS) Skull Base To Thigh 10/22/2022  IMPRESSION: 1. Complete metabolic response to treatment of the right tonsillar mass. No residual measurable disease or hypermetabolism and no cervical lymphadenopathy. 2. No findings for metastatic disease involving the chest, abdomen/pelvis or bony structures. 3. Metallic radiopaque foreign body along the greater curvature of the stomach. This may be related to a prior feeding tube placement. It may be some type of vascular clip. 4. Bilateral renal calculi and renal cortical scarring changes. 5. Stable age advanced vascular disease.    ALLERGIES:  has no known allergies.  Meds: Current Outpatient Medications  Medication Sig Dispense Refill   amoxicillin-clavulanate (AUGMENTIN) 875-125 MG tablet Take 1 (one) tablet by mouth twice a day. (Patient not taking: Reported on 05/02/2023) 14 tablet 0   HYDROcodone-acetaminophen (NORCO/VICODIN) 5-325 MG tablet Take 1 tablet by mouth every 4 (four) hours as needed for pain (Patient not taking: Reported on 05/02/2023) 20 tablet 0   No current facility-administered medications for this encounter.    Physical Findings: The patient is in no acute distress. Patient is alert and oriented. Wt Readings from Last 3 Encounters:  05/02/23 269 lb 9.6 oz (122.3 kg)  10/25/22 248 lb 4 oz (112.6 kg)  09/04/22 248 lb 1.6 oz (112.5 kg)  height is 6\' 1"  (1.854 m) and weight is 269 lb 9.6 oz (122.3 kg). His temperature is 97.9 F (36.6 C). His blood pressure is 165/91 (abnormal) and his pulse is 81. His respiration is 20 and oxygen saturation is 99%. .   General: Alert and oriented, in no acute distress HEENT: Head is  normocephalic. Extraocular movements are intact. Oropharynx is notable for no concerning lesions or masses.  Neck: Neck is notable for no cervical, submandibular, or supraclavicular lymphadenopathy.  Skin: well healed over neck Extremities: No cyanosis or edema. Lymphatics: see Neck Exam Psychiatric: Judgment and insight are intact. Affect is appropriate.   Lab Findings: Lab Results  Component Value Date   WBC 12.0 (H) 09/04/2022   HGB 11.1 (L) 09/04/2022   HCT 34.0 (L) 09/04/2022   MCV 95.2 09/04/2022   PLT 222 09/04/2022    Lab Results  Component Value Date   TSH 2.023 05/08/2022    Radiographic Findings: No results found.  Impression/Plan:    1) Head and Neck Cancer Status: I personally reviewed his PET scan images with him and his wife.  He has had a complete response to treatment.  Our patient navigator will contact radiology and interventional radiology about the incidental finding of the radiopaque foreign body in his stomach to see if this can be explained by his PEG tube removal and to verify if any further follow-up is needed  2) Nutritional Status: Stable, PEG has been removed Wt Readings from Last 3 Encounters:  05/02/23 269 lb 9.6 oz (122.3 kg)  10/25/22 248 lb 4 oz (112.6 kg)  09/04/22 248 lb 1.6 oz (112.5 kg)    3) Risk Factors: The patient has been educated about risk factors including alcohol and tobacco abuse; they understand that avoidance of alcohol and tobacco is important to prevent recurrences as well as other cancers  4) Swallowing: He declined speech-language pathology AGAINST MEDICAL ADVICE.  He is noticing that water goes through his nose if he swallows quickly.  Advised him to swallow more slowly and to let us know if he develops other issues  5) Dental: Using fluoride toothpaste and a Waterpik. Encouraged regular follow up with dentistry.   6) Thyroid function: Check Annually Lab Results  Component Value Date   TSH 2.023 05/08/2022    8) I  will see him back in 6 months for physical exam.  The patient was encouraged to call with any issues or questions before then.  On date of service, in total, I spent 30 minutes on this encounter. Patient was seen in person. _____________________________________   Joyice Faster, PA-C    Aaron Peak, MD

## 2023-05-02 NOTE — Progress Notes (Incomplete Revision)
Radiation Oncology         [phone] ________________________________  Name: Aaron Moore MRN: [id number]  Date: 05/02/2023  DOB: 1965/10/20  Follow-Up Visit Note  CC: Irena Reichmann, DO  Yaakov Guthrie, *  Diagnosis and Prior Radiotherapy:     No diagnosis found.   Cancer Staging  Cancer of tonsillar fossa (HCC) Staging form: Pharynx - HPV-Mediated Oropharynx, AJCC 8th Edition - Clinical stage from 05/08/2022: Stage II (cT3, cN1, cM0, p16+) - Signed by Lonie Peak, MD on 05/08/2022 Stage prefix: Initial diagnosis    CHIEF COMPLAINT:  Here for follow-up and surveillance of tonsillar cancer.   ==========DELIVERED PLANS==========  First Treatment Date: 2022-06-03 - Last Treatment Date: 2022-07-24   Plan Name: HN_R_Tonsil Site: Tonsil, Right Technique: IMRT Mode: Photon Dose Per Fraction: 2 Gy Prescribed Dose (Delivered / Prescribed): 70 Gy / 70 Gy Prescribed Fxs (Delivered / Prescribed): 35 / 35   Narrative:  The patient returns today for routine follow-up.  He completed treatment on 07/24/22.  He is with his significant other and very happy with how he is recovering  Aaron Moore presents to clinic today for follow up for completion of radiation treatment of tonsill cancer. He completed treatment on 07-24-22. Recently this pt reported a chest mass that appeared. A PET scan was scheduled for 10-22-22 to evaluate this for malignancy.   Pain issues, if any: Denies any mouth or throat pain Using a feeding tube?: N/A--removed 08/28/22 Weight changes, if any: Reports a decreased appetite Wt Readings from Last 3 Encounters:  05/02/23 269 lb 9.6 oz (122.3 kg)  10/25/22 248 lb 4 oz (112.6 kg)  09/04/22 248 lb 1.6 oz (112.5 kg)   Swallowing issues, if any: Reports this is slowly improving. Continues to struggle with bread and carbonated beverages.  Smoking or chewing tobacco? None Using fluoride toothpaste daily? Yes--deneis any new mouth sores or dental concerns Last ENT  visit was on: Not since diagnosis Other notable issues, if any: Continues to deal with dry mouth, thick saliva, and altered sense of taste. Skin appears intact and well healed. Denies any new swelling or lumps under chin or down neck. Last saw his medical oncologist Dr. Al Pimple on 09/04/2022  NM PET Image Restag (PS) Skull Base To Thigh 10/22/2022  IMPRESSION: 1. Complete metabolic response to treatment of the right tonsillar mass. No residual measurable disease or hypermetabolism and no cervical lymphadenopathy. 2. No findings for metastatic disease involving the chest, abdomen/pelvis or bony structures. 3. Metallic radiopaque foreign body along the greater curvature of the stomach. This may be related to a prior feeding tube placement. It may be some type of vascular clip. 4. Bilateral renal calculi and renal cortical scarring changes. 5. Stable age advanced vascular disease.    ALLERGIES:  has no known allergies.  Meds: Current Outpatient Medications  Medication Sig Dispense Refill   amoxicillin-clavulanate (AUGMENTIN) 875-125 MG tablet Take 1 (one) tablet by mouth twice a day. (Patient not taking: Reported on 05/02/2023) 14 tablet 0   HYDROcodone-acetaminophen (NORCO/VICODIN) 5-325 MG tablet Take 1 tablet by mouth every 4 (four) hours as needed for pain (Patient not taking: Reported on 05/02/2023) 20 tablet 0   No current facility-administered medications for this encounter.    Physical Findings: The patient is in no acute distress. Patient is alert and oriented. Wt Readings from Last 3 Encounters:  05/02/23 269 lb 9.6 oz (122.3 kg)  10/25/22 248 lb 4 oz (112.6 kg)  09/04/22 248 lb 1.6 oz (112.5 kg)  height is 6\' 1"  (1.854 m) and weight is 269 lb 9.6 oz (122.3 kg). His temperature is 97.9 F (36.6 C). His blood pressure is 165/91 (abnormal) and his pulse is 81. His respiration is 20 and oxygen saturation is 99%. .   General: Alert and oriented, in no acute distress HEENT:  Head is normocephalic. Extraocular movements are intact. Oropharynx is notable for no concerning lesions or masses.  Neck: Neck is notable for no cervical, submandibular, or supraclavicular lymphadenopathy.  Skin: well healed over neck Extremities: No cyanosis or edema. Lymphatics: see Neck Exam Psychiatric: Judgment and insight are intact. Affect is appropriate.   Lab Findings: Lab Results  Component Value Date   WBC 12.0 (H) 09/04/2022   HGB 11.1 (L) 09/04/2022   HCT 34.0 (L) 09/04/2022   MCV 95.2 09/04/2022   PLT 222 09/04/2022    Lab Results  Component Value Date   TSH 2.023 05/08/2022    Radiographic Findings: No results found.  Impression/Plan:    1) Head and Neck Cancer Status: I personally reviewed his PET scan images with him and his wife.  He has had a complete response to treatment.  Our patient navigator will contact radiology and interventional radiology about the incidental finding of the radiopaque foreign body in his stomach to see if this can be explained by his PEG tube removal and to verify if any further follow-up is needed  2) Nutritional Status: Stable, PEG has been removed Wt Readings from Last 3 Encounters:  05/02/23 269 lb 9.6 oz (122.3 kg)  10/25/22 248 lb 4 oz (112.6 kg)  09/04/22 248 lb 1.6 oz (112.5 kg)    3) Risk Factors: The patient has been educated about risk factors including alcohol and tobacco abuse; they understand that avoidance of alcohol and tobacco is important to prevent recurrences as well as other cancers  4) Swallowing: He declined speech-language pathology AGAINST MEDICAL ADVICE.  He is noticing that water goes through his nose if he swallows quickly.  Advised him to swallow more slowly and to let us know if he develops other issues  5) Dental: Using fluoride toothpaste and a Waterpik. Encouraged regular follow up with dentistry.   6) Thyroid function: Check Annually Lab Results  Component Value Date   TSH 2.023 05/08/2022     8) We will arrange restaging CT neck/chest prior to his appt w/ med onc in June. Bryan Lemma will see him in Oct for physical exam and TSH.  The patient was encouraged to call with any issues or questions before then. ***  On date of service, in total, I spent 30 minutes on this encounter. Patient was seen in person. _____________________________________   Joyice Faster, PA-C    Lonie Peak, MD

## 2023-05-05 ENCOUNTER — Other Ambulatory Visit: Payer: Self-pay

## 2023-05-05 ENCOUNTER — Encounter: Payer: Self-pay | Admitting: Radiation Oncology

## 2023-05-05 DIAGNOSIS — C090 Malignant neoplasm of tonsillar fossa: Secondary | ICD-10-CM

## 2023-05-06 ENCOUNTER — Other Ambulatory Visit: Payer: Self-pay

## 2023-05-12 ENCOUNTER — Encounter: Payer: Self-pay | Admitting: Dermatology

## 2023-05-12 ENCOUNTER — Ambulatory Visit: Payer: 59 | Admitting: Dermatology

## 2023-05-12 VITALS — BP 168/78 | HR 87

## 2023-05-12 DIAGNOSIS — D485 Neoplasm of uncertain behavior of skin: Secondary | ICD-10-CM

## 2023-05-12 DIAGNOSIS — L578 Other skin changes due to chronic exposure to nonionizing radiation: Secondary | ICD-10-CM | POA: Diagnosis not present

## 2023-05-12 DIAGNOSIS — C4359 Malignant melanoma of other part of trunk: Secondary | ICD-10-CM

## 2023-05-12 DIAGNOSIS — W908XXA Exposure to other nonionizing radiation, initial encounter: Secondary | ICD-10-CM

## 2023-05-12 DIAGNOSIS — D492 Neoplasm of unspecified behavior of bone, soft tissue, and skin: Secondary | ICD-10-CM | POA: Diagnosis not present

## 2023-05-12 NOTE — Progress Notes (Signed)
   New Patient Visit   Subjective  Aaron Moore is a 58 y.o. male who presents for the following: spot of concern. Lesion on left mid back, present for years but recently starting getting larger and thicker, not previously treated.   Patient has recently had treatment for prostate cancer and EDIT   No hx or family hx of skin cancer.   The patient has spots, moles and lesions to be evaluated, some may be new or changing and the patient may have concern these could be cancer.   The following portions of the chart were reviewed this encounter and updated as appropriate: medications, allergies, medical history  Review of Systems:  No other skin or systemic complaints except as noted in HPI or Assessment and Plan.  Objective  Well appearing patient in no apparent distress; mood and affect are within normal limits.   A focused examination was performed of the following areas:  Back  Relevant exam findings are noted in the Assessment and Plan.  Left Upper Back 2.0 x 1.2 brown black plaque with central nodule   Assessment & Plan   The patient underwent an incisional biopsy of a suspicious skin lesion located on the left mid back with concern for melanoma. The lesion, which was irregular in shape, asymmetrical, and exhibited color variation with dark brown to black areas, raised suspicion for malignancy. The biopsy was performed under local anesthesia, and a representative portion of the lesion was excised for histopathological examination. The tissue sample was sent to pathology for evaluation of cellular atypia, mitotic figures, and other features consistent with melanoma. The patient was instructed to monitor the biopsy site for signs of infection or delayed healing, and follow-up is scheduled for review of the pathology results.  NEOPLASM OF UNCERTAIN BEHAVIOR OF SKIN Left Upper Back Skin / nail biopsy Type of biopsy: incisional   Informed consent: discussed and consent obtained    Timeout: patient name, date of birth, surgical site, and procedure verified   Procedure prep:  Patient was prepped and draped in usual sterile fashion Prep type:  Isopropyl alcohol Anesthesia: the lesion was anesthetized in a standard fashion   Anesthetic:  1% lidocaine  w/ epinephrine  1-100,000 buffered w/ 8.4% NaHCO3 Instrument used: #15 blade   Hemostasis achieved with: pressure and aluminum chloride   Outcome: patient tolerated procedure well   Post-procedure details: sterile dressing applied and wound care instructions given   Dressing type: bandage and petrolatum   Additional details:  2x1.2 Brown black plaque nodule ACTINIC SKIN DAMAGE    ACTINIC DAMAGE - chronic, secondary to cumulative UV radiation exposure/sun exposure over time - diffuse scaly erythematous macules with underlying dyspigmentation - Recommend daily broad spectrum sunscreen SPF 30+ to sun-exposed areas, reapply every 2 hours as needed.  - Recommend staying in the shade or wearing long sleeves, sun glasses (UVA+UVB protection) and wide brim hats (4-inch brim around the entire circumference of the hat). - Call for new or changing lesions.  Return for based on biopsy results.  Documentation: I have reviewed the above documentation for accuracy and completeness, and I agree with the above.  Deneise Finlay, MD

## 2023-05-15 LAB — SURGICAL PATHOLOGY

## 2023-05-16 ENCOUNTER — Other Ambulatory Visit: Payer: Self-pay

## 2023-05-16 DIAGNOSIS — C439 Malignant melanoma of skin, unspecified: Secondary | ICD-10-CM

## 2023-05-16 NOTE — Addendum Note (Signed)
Addended by: Joanie Coddington B on: 05/16/2023 09:40 AM   Modules accepted: Orders

## 2023-05-16 NOTE — Progress Notes (Signed)
Referral placed today for consult with Dr Cherly Hensen in Hematology/Oncology.

## 2023-05-19 ENCOUNTER — Telehealth: Payer: Self-pay | Admitting: Hematology and Oncology

## 2023-05-19 NOTE — Telephone Encounter (Signed)
 Spoke with patient wife confirming upcoming appointment

## 2023-05-20 ENCOUNTER — Telehealth: Payer: Self-pay

## 2023-05-20 NOTE — Telephone Encounter (Signed)
Referral was placed with Dr Cherly Hensen 05/16/2023

## 2023-05-20 NOTE — Telephone Encounter (Signed)
-----   Message from [facility] sent at 05/19/2023  4:16 PM EST ----- Called and spoke with patient's wife, after confirming ability to speak with patient's wife for HIPAA clearance. Discussed results showing T4 melanoma. Discussed importance of referral to Medical Oncology, Dr. Cherly Hensen. Messaged Dr. Cherly Hensen to alert him. Patient's wife is aware and will follow through with appointment with medical oncology.Conversation was had on the evening of 05/15/2023.

## 2023-05-22 ENCOUNTER — Encounter: Payer: Self-pay | Admitting: Hematology and Oncology

## 2023-05-22 ENCOUNTER — Inpatient Hospital Stay: Payer: 59 | Attending: Hematology and Oncology | Admitting: Hematology and Oncology

## 2023-05-22 VITALS — BP 157/89 | HR 80 | Temp 97.7°F | Resp 16 | Wt 271.9 lb

## 2023-05-22 DIAGNOSIS — Z923 Personal history of irradiation: Secondary | ICD-10-CM | POA: Insufficient documentation

## 2023-05-22 DIAGNOSIS — C4359 Malignant melanoma of other part of trunk: Secondary | ICD-10-CM | POA: Diagnosis not present

## 2023-05-22 DIAGNOSIS — C090 Malignant neoplasm of tonsillar fossa: Secondary | ICD-10-CM | POA: Insufficient documentation

## 2023-05-22 DIAGNOSIS — Z9221 Personal history of antineoplastic chemotherapy: Secondary | ICD-10-CM | POA: Diagnosis not present

## 2023-05-22 NOTE — Progress Notes (Signed)
[facility] Cancer Follow up:    Irena Reichmann, DO [address] [place] [address] [place] [address]   DIAGNOSIS:  Cancer Staging  Cancer of tonsillar fossa (HCC) Staging form: Pharynx - HPV-Mediated Oropharynx, AJCC 8th Edition - Clinical stage from 05/08/2022: Stage II (cT3, cN1, cM0, p16+) - Signed by Lonie Peak, MD on 05/08/2022 Stage prefix: Initial diagnosis   SUMMARY OF ONCOLOGIC HISTORY: Oncology History  Cancer of tonsillar fossa (HCC)  05/08/2022 Initial Diagnosis   Cancer of tonsillar fossa (HCC)   05/08/2022 Cancer Staging   Staging form: Pharynx - HPV-Mediated Oropharynx, AJCC 8th Edition - Clinical stage from 05/08/2022: Stage II (cT3, cN1, cM0, p16+) - Signed by Lonie Peak, MD on 05/08/2022 Stage prefix: Initial diagnosis   06/05/2022 -  Chemotherapy   Patient is on Treatment Plan : HEAD/NECK Cisplatin (40) q7d       CURRENT THERAPY: Concurrent chemoradiation  INTERVAL HISTORY:  Aaron Moore 58 y.o. male returns for follow-up.   Patient is well-known to me.  We have treated him for cancer of tonsillar fossa HPV mediated in 2024 with chemoradiation.  He most recently had a growing mole on his back and went to see his dermatologist.  Incisional biopsy was done.  Left upper back biopsy showed malignant melanoma, Breslow's depth 9 mm, Clark's level 4, lateral margins and deep margin involved.  He is now back in the medical oncology clinic for additional recommendations.  His wife said he had a mole on his back for almost 20 years but only since June of last year that the mole started to change.  She also has history of prostate cancer and cancer of the proximal left also.  He is not as interested in genetic testing, he does not have any children.  There is also no family history of any cancer on both sides.  He denies any major changes in cough, chest pain, shortness of breath, any neurological symptoms or change in bowel or urinary habits  Rest of the  pertinent 10 point ROS reviewed and negative  Wt Readings from Last 3 Encounters:  05/02/23 269 lb 9.6 oz (122.3 kg)  10/25/22 248 lb 4 oz (112.6 kg)  09/04/22 248 lb 1.6 oz (112.5 kg)     Patient Active Problem List   Diagnosis Date Noted   Port-A-Cath in place 06/04/2022   Cancer of tonsillar fossa (HCC) 05/08/2022   Prostate cancer (HCC) 02/07/2022   Medial epicondylitis of left elbow 03/08/2015   Obesity, Class II, BMI 35-39.9, with comorbidity 11/28/2010   Abdominal wall mass of epigastric region - old hernia sac/lipoma 11/28/2010   Abdominal pain, RLQ - muscle strain 11/28/2010    has no known allergies.  MEDICAL HISTORY: Past Medical History:  Diagnosis Date   Abdominal pain    Cancer (HCC)    prostate cancer   Complication of anesthesia    slow to wake up 3 years ago after foot surgery   ED (erectile dysfunction)    Gout    Gout    History of kidney stones    Hypertension    Obesity, Class II, BMI 35-39.9, with comorbidity    Painful orthopaedic hardware (HCC)    left foot    SURGICAL HISTORY: Past Surgical History:  Procedure Laterality Date   amputation of 2 toes on left foot      CARPAL TUNNEL RELEASE  05/03/2011   Procedure: CARPAL TUNNEL RELEASE;  Surgeon: Nicki Reaper, MD;  Location: [facility];  Service: Orthopedics;  Laterality: Left;   FOOT FRACTURE SURGERY Left    x3   HARDWARE REMOVAL Left 06/03/2019   Procedure: HARDWARE REMOVAL;  Surgeon: Terance Hart, MD;  Location: [facility];  Service: Orthopedics;  Laterality: Left;  PROCEDURE: LEFT FOOT REMOVAL OF HARDWARE, IRRIGATION AND DEBRIDEMENT,PLACEMENT OF CEMENT SPACER, DEBULKIN, SOFT TISSUE MASS LENGTH OF SURGERY: 2 HOURS   HERNIA REPAIR  02/06/2009   Lap supraumb & umb VWH repairs   INCISION AND DRAINAGE OF WOUND Left 06/03/2019   Procedure: IRRIGATION AND DEBRIDEMENT WOUND WITH IMPLANTATION OF ANTIBIOTIC CEMENT SPACER;  Surgeon: Terance Hart, MD;   Location: [facility];  Service: Orthopedics;  Laterality: Left;   IR GASTROSTOMY TUBE MOD SED  05/29/2022   IR GASTROSTOMY TUBE REMOVAL  08/28/2022   IR IMAGING GUIDED PORT INSERTION  05/29/2022   IR REMOVAL TUN ACCESS W/ PORT W/O FL MOD SED  11/04/2022   left rotator cuff surgery      LYMPHADENECTOMY Bilateral 02/07/2022   Procedure: BILATERAL PELVIC LYMPHADENECTOMY;  Surgeon: Heloise Purpura, MD;  Location: [facility];  Service: Urology;  Laterality: Bilateral;   MASS EXCISION Left 06/03/2019   Procedure: DEBULKING LEFT FOOT MASS;  Surgeon: Terance Hart, MD;  Location: [facility];  Service: Orthopedics;  Laterality: Left;   ROBOT ASSISTED LAPAROSCOPIC RADICAL PROSTATECTOMY N/A 02/07/2022   Procedure: XI ROBOTIC ASSISTED LAPAROSCOPIC RADICAL PROSTATECTOMY LEVEL 3;  Surgeon: Heloise Purpura, MD;  Location: [facility];  Service: Urology;  Laterality: N/A;  210 MINUTES NEEDED FOR CASE    SOCIAL HISTORY: Social History   Socioeconomic History   Marital status: Married    Spouse name: Not on file   Number of children: Not on file   Years of education: Not on file   Highest education level: Not on file  Occupational History   Not on file  Tobacco Use   Smoking status: Never   Smokeless tobacco: Never  Vaping Use   Vaping status: Never Used  Substance and Sexual Activity   Alcohol use: No   Drug use: No   Sexual activity: Not on file  Other Topics Concern   Not on file  Social History Narrative   Not on file   Social Drivers of Health   Financial Resource Strain: Not on file  Food Insecurity: No Food Insecurity (02/07/2022)   Hunger Vital Sign    Worried About Running Out of Food in the Last Year: Never true    Ran Out of Food in the Last Year: Never true  Transportation Needs: No Transportation Needs (02/07/2022)   PRAPARE - Administrator, Civil Service (Medical): No    Lack of Transportation (Non-Medical): No  Physical Activity: Not on file   Stress: Not on file  Social Connections: Unknown (05/01/2022)   [facility]   Social Network    Social Network: Not on file  Intimate Partner Violence: Unknown (05/01/2022)   Received from [facility], [facility]   HITS    Physically Hurt: Not on file    Insult or Talk Down To: Not on file    Threaten Physical Harm: Not on file    Scream or Curse: Not on file    FAMILY HISTORY: Family History  Problem Relation Age of Onset   Diabetes Mother    Diabetes Father     PHYSICAL EXAMINATION  BP (!) 157/89 (BP Location: Left Arm, Patient Position: Sitting)  Pulse 80   Temp 97.7 F (36.5 C) (Temporal)   Resp 16   Wt 271 lb 14.4 oz (123.3 kg)   SpO2 100%   BMI 35.87 kg/m    There were no vitals filed for this visit.    Physical Exam Constitutional:      General: He is not in acute distress.    Appearance: Normal appearance. He is not toxic-appearing.  HENT:     Head: Normocephalic and atraumatic.     Mouth/Throat:     Mouth: Mucous membranes are moist.  Eyes:     General: No scleral icterus. Cardiovascular:     Rate and Rhythm: Normal rate and regular rhythm.     Pulses: Normal pulses.     Heart sounds: Normal heart sounds.  Pulmonary:     Effort: Pulmonary effort is normal.     Breath sounds: Normal breath sounds.  Abdominal:     General: Abdomen is flat. Bowel sounds are normal. There is no distension.     Palpations: Abdomen is soft.     Tenderness: There is no abdominal tenderness.  Musculoskeletal:        General: No swelling.     Cervical back: Neck supple.  Lymphadenopathy:     Cervical: No cervical adenopathy.  Skin:    General: Skin is warm and dry.     Findings: No rash.  Neurological:     General: No focal deficit present.     Mental Status: He is alert.  Psychiatric:        Mood and Affect: Mood normal.        Behavior: Behavior normal.     LABORATORY DATA:  CBC    Component Value Date/Time   WBC  12.0 (H) 09/04/2022 1550   RBC 3.57 (L) 09/04/2022 1550   HGB 11.1 (L) 09/04/2022 1550   HGB 8.5 (L) 08/01/2022 1054   HCT 34.0 (L) 09/04/2022 1550   PLT 222 09/04/2022 1550   PLT 210 08/01/2022 1054   MCV 95.2 09/04/2022 1550   MCH 31.1 09/04/2022 1550   MCHC 32.6 09/04/2022 1550   RDW 15.8 (H) 09/04/2022 1550   LYMPHSABS 0.3 (L) 09/04/2022 1550   MONOABS 1.2 (H) 09/04/2022 1550   EOSABS 0.1 09/04/2022 1550   BASOSABS 0.0 09/04/2022 1550    CMP     Component Value Date/Time   NA 137 09/04/2022 1550   K 4.2 09/04/2022 1550   CL 103 09/04/2022 1550   CO2 27 09/04/2022 1550   GLUCOSE 137 (H) 09/04/2022 1550   BUN 19 09/04/2022 1550   CREATININE 1.19 09/04/2022 1550   CREATININE 1.02 08/01/2022 1054   CALCIUM 9.3 09/04/2022 1550   PROT 7.2 09/04/2022 1550   ALBUMIN 4.2 09/04/2022 1550   AST 13 (L) 09/04/2022 1550   AST 13 (L) 08/01/2022 1054   ALT 13 09/04/2022 1550   ALT 14 08/01/2022 1054   ALKPHOS 98 09/04/2022 1550   BILITOT 0.7 09/04/2022 1550   BILITOT 0.5 08/01/2022 1054   GFRNONAA >60 09/04/2022 1550   GFRNONAA >60 08/01/2022 1054   GFRAA >60 10/21/2017 2005     ASSESSMENT & PLAN:  This is a very pleasant 58 year old male patient with 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 He had a chronic mole for the past 20 years but has noticed some change  in it in the past 6 months, the mole started growing and hence went to see his dermatologist, had an incision and a biopsy which showed malignant melanoma, Clark's level 4, all margins involved at least staged as a pT4a now back to medical oncology for additional recommendations.  We have discussed the next steps which includes a whole-body PET/CT for staging, surgery referral for complete excision and possibly sentinel lymph node biopsy.  We  have also discussed about role of adjuvant immunotherapy to reduce the risk of melanoma recurrence as well as metastatic disease.  He tolerated chemoradiation extremely well.  He is willing to proceed with all treatment recommendations as we discussed with you.  PET/CT ordered, referral placed to Dr. Donell Beers.  I will hope to see him back in approximately 3 to 4 weeks for follow-up.  I will plan to give him adjuvant Keytruda for a year as long as there is no evidence of metastatic disease.  Total encounter time: 40 minutes*in face-to-face visit time, chart review, lab review, care coordination, order entry, and documentation of the encounter time.  *Total Encounter Time as defined by the Centers for Medicare and Medicaid Services includes, in addition to the face-to-face time of a patient visit (documented in the note above) non-face-to-face time: obtaining and reviewing outside history, ordering and reviewing medications, tests or procedures, care coordination (communications with other health care professionals or caregivers) and documentation in the medical record.

## 2023-05-23 ENCOUNTER — Telehealth: Payer: Self-pay | Admitting: Hematology and Oncology

## 2023-05-23 NOTE — Telephone Encounter (Signed)
 Spoke with patient confirming upcoming appointment

## 2023-05-28 ENCOUNTER — Other Ambulatory Visit: Payer: Self-pay

## 2023-05-30 ENCOUNTER — Other Ambulatory Visit: Payer: Self-pay | Admitting: General Surgery

## 2023-05-30 ENCOUNTER — Ambulatory Visit (HOSPITAL_COMMUNITY)
Admission: RE | Admit: 2023-05-30 | Discharge: 2023-05-30 | Disposition: A | Discharge status: Home / Self Care | Payer: 59 | Source: Ambulatory Visit | Attending: Hematology and Oncology | Admitting: Hematology and Oncology

## 2023-05-30 DIAGNOSIS — C4359 Malignant melanoma of other part of trunk: Secondary | ICD-10-CM

## 2023-05-30 LAB — GLUCOSE, CAPILLARY: Glucose-Capillary: 119 mg/dL — ABNORMAL HIGH (ref 70–99)

## 2023-05-30 MED ORDER — FLUDEOXYGLUCOSE F - 18 (FDG) INJECTION
13.5700 | Freq: Once | INTRAVENOUS | Status: AC
  Administered 2023-05-30: 13.57 via INTRAVENOUS

## 2023-06-02 ENCOUNTER — Other Ambulatory Visit (HOSPITAL_COMMUNITY): Payer: Self-pay | Admitting: General Surgery

## 2023-06-02 DIAGNOSIS — C4359 Malignant melanoma of other part of trunk: Secondary | ICD-10-CM

## 2023-06-04 ENCOUNTER — Telehealth: Payer: Self-pay | Admitting: *Deleted

## 2023-06-04 NOTE — Telephone Encounter (Signed)
 [name] wife called asking if pt's visit on 3/12 should be changed to post surgery scheduled for 06/19/2023.  This note will be forwarded to MD for recommendation

## 2023-06-05 ENCOUNTER — Ambulatory Visit (HOSPITAL_COMMUNITY)
Admission: RE | Admit: 2023-06-05 | Discharge: 2023-06-05 | Disposition: A | Discharge status: Home / Self Care | Source: Ambulatory Visit | Attending: General Surgery | Admitting: General Surgery

## 2023-06-05 ENCOUNTER — Telehealth: Payer: Self-pay

## 2023-06-05 ENCOUNTER — Telehealth: Payer: Self-pay | Admitting: Hematology and Oncology

## 2023-06-05 DIAGNOSIS — C4359 Malignant melanoma of other part of trunk: Secondary | ICD-10-CM | POA: Insufficient documentation

## 2023-06-05 MED ORDER — TECHNETIUM TC 99M TILMANOCEPT KIT
0.5000 | PACK | Freq: Once | INTRAVENOUS | Status: AC | PRN
Start: 2023-06-05 — End: 2023-06-05
  Administered 2023-06-05: 0.5 via INTRADERMAL

## 2023-06-05 NOTE — Telephone Encounter (Signed)
 Attempted to call pt to review results per MD and offer MD appt for 2nd week of April. LVM for call back. Message sent to scheduling to facilitate appt.

## 2023-06-05 NOTE — Telephone Encounter (Signed)
 Spoke with patient wife confirming upcoming appointment

## 2023-06-05 NOTE — Telephone Encounter (Signed)
-----   Message from [name] sent at 06/05/2023  8:34 AM EST ----- PET scan looks great. I need to see him in second week of April  Thanks.

## 2023-06-11 ENCOUNTER — Inpatient Hospital Stay: Payer: 59 | Admitting: Hematology and Oncology

## 2023-06-12 NOTE — Pre-Procedure Instructions (Signed)
 Surgical Instructions   Your procedure is scheduled on Thursday, March 20th. Report to [facility] Main Entrance "A" at 12:00 P.M., then check in with the Admitting office. Any questions or running late day of surgery: call [phone]  Questions prior to your surgery date: call [phone], Monday-Friday, 8am-4pm. If you experience any cold or flu symptoms such as cough, fever, chills, shortness of breath, etc. between now and your scheduled surgery, please notify us at the above number.     Remember:  Do not eat after midnight the night before your surgery   You may drink clear liquids until 11:00 AM the morning of your surgery.   Clear liquids allowed are: Water, Non-Citrus Juices (without pulp), Carbonated Beverages, Clear Tea (no milk, honey, etc.), Black Coffee Only (NO MILK, CREAM OR POWDERED CREAMER of any kind), and Gatorade.    Take these medicines the morning of surgery with A SIP OF WATER  allopurinol (ZYLOPRIM)  metoprolol succinate (TOPROL-XL)     One week prior to surgery, STOP taking any Aspirin (unless otherwise instructed by your surgeon) Aleve, Naproxen, Ibuprofen, Motrin, Advil, Goody's, BC's, all herbal medications, fish oil, and non-prescription vitamins.                     Do NOT Smoke (Tobacco/Vaping) for 24 hours prior to your procedure.  If you use a CPAP at night, you may bring your mask/headgear for your overnight stay.   You will be asked to remove any contacts, glasses, piercing's, hearing aid's, dentures/partials prior to surgery. Please bring cases for these items if needed.    Patients discharged the day of surgery will not be allowed to drive home, and someone needs to stay with them for 24 hours.  SURGICAL WAITING ROOM VISITATION Patients may have no more than 2 support people in the waiting area - these visitors may rotate.   Pre-op nurse will coordinate an appropriate time for 1 ADULT support person, who may not rotate, to accompany patient  in pre-op.  Children under the age of 47 must have an adult with them who is not the patient and must remain in the main waiting area with an adult.  If the patient needs to stay at the hospital during part of their recovery, the visitor guidelines for inpatient rooms apply.  Please refer to the [facility] website for the visitor guidelines for any additional information.   If you received a COVID test during your pre-op visit  it is requested that you wear a mask when out in public, stay away from anyone that may not be feeling well and notify your surgeon if you develop symptoms. If you have been in contact with anyone that has tested positive in the last 10 days please notify you surgeon.      Pre-operative CHG Bathing Instructions   You can play a key role in reducing the risk of infection after surgery. Your skin needs to be as free of germs as possible. You can reduce the number of germs on your skin by washing with CHG (chlorhexidine gluconate) soap before surgery. CHG is an antiseptic soap that kills germs and continues to kill germs even after washing.   DO NOT use if you have an allergy to chlorhexidine/CHG or antibacterial soaps. If your skin becomes reddened or irritated, stop using the CHG and notify one of our RNs at [phone].              TAKE A SHOWER THE  NIGHT BEFORE SURGERY AND THE DAY OF SURGERY    Please keep in mind the following:  DO NOT shave, including legs and underarms, 48 hours prior to surgery.   You may shave your face before/day of surgery.  Place clean sheets on your bed the night before surgery Use a clean washcloth (not used since being washed) for each shower. DO NOT sleep with pet's night before surgery.  CHG Shower Instructions:  Wash your face and private area with normal soap. If you choose to wash your hair, wash first with your normal shampoo.  After you use shampoo/soap, rinse your hair and body thoroughly to remove shampoo/soap residue.   Turn the water OFF and apply half the bottle of CHG soap to a CLEAN washcloth.  Apply CHG soap ONLY FROM YOUR NECK DOWN TO YOUR TOES (washing for 3-5 minutes)  DO NOT use CHG soap on face, private areas, open wounds, or sores.  Pay special attention to the area where your surgery is being performed.  If you are having back surgery, having someone wash your back for you may be helpful. Wait 2 minutes after CHG soap is applied, then you may rinse off the CHG soap.  Pat dry with a clean towel  Put on clean pajamas    Additional instructions for the day of surgery: DO NOT APPLY any lotions, deodorants, cologne, or perfumes.   Do not wear jewelry or makeup Do not wear nail polish, gel polish, artificial nails, or any other type of covering on natural nails (fingers and toes) Do not bring valuables to the hospital. [facility] is not responsible for valuables/personal belongings. Put on clean/comfortable clothes.  Please brush your teeth.  Ask your nurse before applying any prescription medications to the skin.

## 2023-06-13 ENCOUNTER — Encounter (HOSPITAL_COMMUNITY)
Admission: RE | Admit: 2023-06-13 | Discharge: 2023-06-13 | Disposition: A | Discharge status: Home / Self Care | Source: Ambulatory Visit | Attending: General Surgery | Admitting: General Surgery

## 2023-06-13 ENCOUNTER — Other Ambulatory Visit: Payer: Self-pay

## 2023-06-13 ENCOUNTER — Encounter (HOSPITAL_COMMUNITY): Payer: Self-pay

## 2023-06-13 VITALS — BP 166/114 | HR 84 | Temp 98.3°F | Resp 18 | Ht 73.0 in | Wt 256.4 lb

## 2023-06-13 DIAGNOSIS — Z01818 Encounter for other preprocedural examination: Secondary | ICD-10-CM | POA: Insufficient documentation

## 2023-06-13 DIAGNOSIS — R9431 Abnormal electrocardiogram [ECG] [EKG]: Secondary | ICD-10-CM | POA: Insufficient documentation

## 2023-06-13 DIAGNOSIS — Z01810 Encounter for preprocedural cardiovascular examination: Secondary | ICD-10-CM | POA: Diagnosis present

## 2023-06-13 DIAGNOSIS — I10 Essential (primary) hypertension: Secondary | ICD-10-CM | POA: Insufficient documentation

## 2023-06-13 DIAGNOSIS — Z01812 Encounter for preprocedural laboratory examination: Secondary | ICD-10-CM | POA: Diagnosis present

## 2023-06-13 LAB — BASIC METABOLIC PANEL
Anion gap: 8 (ref 5–15)
BUN: 15 mg/dL (ref 6–20)
CO2: 27 mmol/L (ref 22–32)
Calcium: 9.2 mg/dL (ref 8.9–10.3)
Chloride: 107 mmol/L (ref 98–111)
Creatinine, Ser: 1.25 mg/dL — ABNORMAL HIGH (ref 0.61–1.24)
GFR, Estimated: >60 mL/min (ref >60)
Glucose, Bld: 101 mg/dL — ABNORMAL HIGH (ref 70–99)
Potassium: 4.1 mmol/L (ref 3.5–5.1)
Sodium: 142 mmol/L (ref 135–145)

## 2023-06-13 LAB — CBC
HCT: 42.4 % (ref 39.0–52.0)
Hemoglobin: 13.8 g/dL (ref 13.0–17.0)
MCH: 28.9 pg (ref 26.0–34.0)
MCHC: 32.5 g/dL (ref 30.0–36.0)
MCV: 88.7 fL (ref 80.0–100.0)
Platelets: 239 10*3/uL (ref 150–400)
RBC: 4.78 MIL/uL (ref 4.22–5.81)
RDW: 14 % (ref 11.5–15.5)
WBC: 6 10*3/uL (ref 4.0–10.5)
nRBC: 0 % (ref 0.0–0.2)

## 2023-06-13 NOTE — Progress Notes (Signed)
 PCP - Irena Reichmann Cardiologist - denies cardiac workup or cardiac issues  PPM/ICD - denies   Chest x-ray - N/A EKG - 06/13/2023  Stress Test - denies ECHO -denies  Cardiac Cath -denies   Sleep Study - denies   Fasting Blood Sugar - N/A   Last dose of GLP1 agonist-  N/A   Blood Thinner Instructions: N/A Aspirin Instructions:N/A  ERAS Protcol -ERAS Per order   COVID TEST-  N/A   Anesthesia review: Antionette Poles notified of Pt BP at PAT. 177/108, 163/114. Pt asymptomatic and states that he did not take his blood pressure medications today. Pt states that he does not regularly take his medications because he forgets. Pt states that his BP is normally 150-170/90s at home and his PCP is aware. Patient and wife educated on the importance of taking his medications as prescribed and to check his BP at home. Pt and wife educated to notify PCP if BP remains elevated. Pt  and wife educated to seek medical treatment with any symptoms that could indicate stroke/chest pain, dizziness, throbbing headache. Patient's wife states that she will make sure that he takes his medications daily prior to surgery.   Patient denies shortness of breath, fever, cough and chest pain at PAT appointment   All instructions explained to the patient, with a verbal understanding of the material. Patient agrees to go over the instructions while at home for a better understanding.  The opportunity to ask questions was provided.

## 2023-06-16 NOTE — Anesthesia Preprocedure Evaluation (Signed)
 Anesthesia Evaluation  Patient identified by MRN, date of birth, ID band Patient awake    Reviewed: Allergy & Precautions, NPO status , Patient's Chart, lab work & pertinent test results  History of Anesthesia Complications (+) PROLONGED EMERGENCE and history of anesthetic complications  Airway Mallampati: II  TM Distance: >3 FB Neck ROM: Full    Dental no notable dental hx.    Pulmonary neg pulmonary ROS   Pulmonary exam normal        Cardiovascular hypertension, Pt. on medications  Rhythm:Regular Rate:Normal     Neuro/Psych negative neurological ROS  negative psych ROS   GI/Hepatic negative GI ROS, Neg liver ROS,,,  Endo/Other  negative endocrine ROS    Renal/GU negative Renal ROS   Prostate Ca    Musculoskeletal negative musculoskeletal ROS (+)    Abdominal Normal abdominal exam  (+)   Peds  Hematology negative hematology ROS (+)   Anesthesia Other Findings   Reproductive/Obstetrics                              Anesthesia Physical Anesthesia Plan  ASA: 3  Anesthesia Plan: General   Post-op Pain Management:    Induction: Intravenous  PONV Risk Score and Plan: 2 and Ondansetron, Dexamethasone, Midazolam and Treatment may vary due to age or medical condition  Airway Management Planned: Oral ETT and Video Laryngoscope Planned  Additional Equipment: None  Intra-op Plan:   Post-operative Plan: Extubation in OR  Informed Consent: I have reviewed the patients History and Physical, chart, labs and discussed the procedure including the risks, benefits and alternatives for the proposed anesthesia with the patient or authorized representative who has indicated his/her understanding and acceptance.     Dental advisory given  Plan Discussed with: CRNA  Anesthesia Plan Comments: (Per anesthesia intubation note 02/07/2022, difficult airway due large tongue, limited oral opening,  dentition, and large epiglottis.  )        Anesthesia Quick Evaluation

## 2023-06-18 NOTE — H&P (Signed)
 Chief Complaint:   Chief Complaint  Patient presents with  New Consultation  Melanoma L upper back,   Subjective   HPI  Danen is a 58 y.o. male who presents for New Consultation ( Melanoma L upper back,) History of Present Illness Mr. Pecola Leisure, a 58 year old with a history of prostate and throat cancer, presents with a melanoma on his back. The melanoma was discovered recently, although the patient reports having a small round spot on his back for several years. The spot started to raise and change in appearance after his last round of radiation therapy (head and neck) in July. The patient describes the melanoma as blackish and raised, resembling a piece of broccoli. There was no reported bleeding from the lesion.  In addition to the melanoma, the patient has been experiencing chronic pain in his left upper abdomen for the past few weeks to a month. The pain is severe enough to prevent him from lying on his back and is aggravated by deep breathing or coughing. The patient denies any difficulty breathing.  Review of Systems  HENT: Positive for sore throat.  Neurological: Positive for weakness and headaches.  All other systems reviewed and are negative.  Patient Active Problem List  Diagnosis  Cancer of tonsillar fossa (CMS/HHS-HCC)  Prostate cancer (CMS/HHS-HCC)  Port-A-Cath in place  Melanoma of back (CMS/HHS-HCC)  History of amputation of toe (CMS/HHS-HCC)   Outpatient Medications Prior to Visit  Medication Sig Dispense Refill  allopurinoL (ZYLOPRIM) 100 MG tablet TAKE 1 TABLET BY MOUTH EVERY DAY TO PREVENT GOUT  amLODIPine-valsartan (EXFORGE) 10-320 mg tablet Take 1 tablet by mouth once daily  cephalexin (KEFLEX) 500 MG capsule Take 500 mg by mouth 3 (three) times daily  doxycycline (VIBRAMYCIN) 100 MG capsule Take by mouth 2 (two) times daily  fluconazole (DIFLUCAN) 100 MG tablet 1 (ONE) TABLET AS INSTRUCTED, TAKE 2 TABLETS BY MOUTH ON DAY 1 THEN 1 TABLET DAILY ON DAY 2-10.   LIDOCAINE 2 % solution as directed  metoprolol SUCCinate (TOPROL-XL) 50 MG XL tablet Take 50 mg by mouth once daily  nitrofurantoin, macrocrystal-monohydrate, (MACROBID) 100 MG capsule Take 100 mg by mouth 2 (two) times daily  nystatin (MYCOSTATIN) 100,000 unit/mL suspension TAKE 5 MLS (500,000 UNITS TOTAL) BY MOUTH 4 (FOUR) TIMES DAILY.  sucralfate (CARAFATE) 1 gram tablet DISSOLVE 1 TABLET IN 10 ML H20 AND SWISH/SWALLOW 30 MIN PRIOR TO MEALS AND BEDTIME.   No facility-administered medications prior to visit.     Objective   Vitals:  05/30/23 1015  BP: (!) 169/100  Pulse: 106  Temp: 36.7 C (98 F)  SpO2: 97%  Weight: (!) 122.9 kg (271 lb)  Height: 185.4 cm (6\' 1" )   Body mass index is 35.75 kg/m.  Home Vitals:   Physical Exam Physical Exam  SKIN: melanoma shave biopsy site open, no evidence of infection.   Head: Normocephalic and atraumatic.  Mouth/Throat: Oropharynx is clear and moist. No oropharyngeal exudate.  Eyes: Conjunctivae are normal. Pupils are equal, round, and reactive to light. No scleral icterus.  Neck: Normal range of motion. Neck supple. No tracheal deviation present. No thyromegaly present.  Cardiovascular: Normal rate, regular rhythm, normal heart sounds and intact distal pulses. Exam reveals no gallop and no friction rub.  No murmur heard. Respiratory: Effort normal and breath sounds normal. No respiratory distress. No wheezes, rales or rhonchi. No chest wall tenderness.  GI: Soft. Bowel sounds are normal. Abdomen is soft, non tender, non distended. No masses or hepatosplenomegaly is present. There  is no rebound and no guarding.  Musculoskeletal: . Extremities are non tender. Antalgic gait due to amputation of several toes on right foot.  Lymphadenopathy: No cervical or axillary adenopathy.  Neurological: Alert and oriented to person, place, and time. Coordination normal.  Skin: Skin is warm and dry. No rash noted. No diaphoresis. No erythema. No  pallor.  Psychiatric: Normal mood and affect.Behavior is normal. Judgment and thought content normal.   Results RADIOLOGY PET scan: Negative (09/2022)  PATHOLOGY Biopsy: Thick melanoma, 9 mm, positive margins, no vascular invasion, no perineural invasion, no satellite lesions, low mitotic rate (05/19/2023)    Assessment/Plan:   Assessment & Plan Melanoma Thick melanoma (9mm) on the back, present for a long time but started raising in July. Biopsy showed positive margins but no evidence of invasion into blood vessels or nerves, no satellite lesions, and low mitotic rate. -Plan for wide local excision with advancement flap closure to ensure clear margins and decrease the risk of recurrence. -Perform sentinel lymph node biopsy to assess for microscopic metastasis. -Schedule lymphoscintigraphy prior to surgery to identify the lymphatic drainage pattern and guide lymph node biopsy. -Postoperative restrictions include no heavy lifting or strenuous activity for at least two weeks to prevent wound dehiscence.  Chronic Pain Reports chronic pain in the upper back for the past few weeks to a month. -Await results of recent PET scan to evaluate for any underlying pathology causing the pain.  History of Prostate Cancer In remission following treatment. -Continue current follow-up plan as per oncologist.  Pending PET Scan Awaiting results of recent PET scan to evaluate for any evidence of metastasis from melanoma or recurrence of prostate cancer. -Review results when available and adjust treatment plan accordingly.  Surgery Planning Plan to schedule surgery within the next two weeks. -Start preoperative planning and insurance pre-approval process.  Discussed risk melanoma surgery which are primarily related to risk of wound dehiscence or infection on the back, permanent numbness, neuroma and lymph node site or lymphedema, heart or lung complications, blood clot.  Diagnoses and all orders  for this visit:  Melanoma of back (CMS/HHS-HCC) - NM lymphoscintigraphy; Future  Prostate cancer (CMS/HHS-HCC)  Cancer of tonsillar fossa (CMS/HHS-HCC)  History of amputation of toe (CMS/HHS-HCC)  Port-A-Cath in place

## 2023-06-19 ENCOUNTER — Ambulatory Visit (HOSPITAL_COMMUNITY): Payer: Self-pay | Admitting: Physician Assistant

## 2023-06-19 ENCOUNTER — Other Ambulatory Visit: Payer: Self-pay

## 2023-06-19 ENCOUNTER — Encounter: Payer: Self-pay | Admitting: Hematology and Oncology

## 2023-06-19 ENCOUNTER — Encounter (HOSPITAL_COMMUNITY)
Admission: RE | Disposition: A | Discharge status: Home / Self Care | Payer: Self-pay | Source: Home / Self Care | Attending: General Surgery

## 2023-06-19 ENCOUNTER — Other Ambulatory Visit (HOSPITAL_BASED_OUTPATIENT_CLINIC_OR_DEPARTMENT_OTHER): Payer: Self-pay

## 2023-06-19 ENCOUNTER — Ambulatory Visit (HOSPITAL_COMMUNITY)
Admission: RE | Admit: 2023-06-19 | Discharge: 2023-06-19 | Disposition: A | Discharge status: Home / Self Care | Source: Ambulatory Visit | Attending: General Surgery | Admitting: General Surgery

## 2023-06-19 ENCOUNTER — Other Ambulatory Visit (HOSPITAL_COMMUNITY): Payer: Self-pay

## 2023-06-19 ENCOUNTER — Ambulatory Visit (HOSPITAL_COMMUNITY)
Admission: RE | Admit: 2023-06-19 | Discharge: 2023-06-19 | Disposition: A | Discharge status: Home / Self Care | Attending: General Surgery | Admitting: General Surgery

## 2023-06-19 ENCOUNTER — Encounter (HOSPITAL_COMMUNITY): Payer: Self-pay | Admitting: General Surgery

## 2023-06-19 ENCOUNTER — Ambulatory Visit (HOSPITAL_COMMUNITY)

## 2023-06-19 DIAGNOSIS — Z8546 Personal history of malignant neoplasm of prostate: Secondary | ICD-10-CM | POA: Insufficient documentation

## 2023-06-19 DIAGNOSIS — C4359 Malignant melanoma of other part of trunk: Secondary | ICD-10-CM | POA: Insufficient documentation

## 2023-06-19 DIAGNOSIS — I10 Essential (primary) hypertension: Secondary | ICD-10-CM

## 2023-06-19 DIAGNOSIS — C773 Secondary and unspecified malignant neoplasm of axilla and upper limb lymph nodes: Secondary | ICD-10-CM | POA: Diagnosis not present

## 2023-06-19 DIAGNOSIS — E66813 Obesity, class 3: Secondary | ICD-10-CM | POA: Insufficient documentation

## 2023-06-19 DIAGNOSIS — Z6834 Body mass index (BMI) 34.0-34.9, adult: Secondary | ICD-10-CM | POA: Diagnosis not present

## 2023-06-19 DIAGNOSIS — C439 Malignant melanoma of skin, unspecified: Secondary | ICD-10-CM | POA: Diagnosis present

## 2023-06-19 HISTORY — PX: SENTINEL NODE BIOPSY: SHX6608

## 2023-06-19 HISTORY — PX: MELANOMA EXCISION: SHX5266

## 2023-06-19 SURGERY — EXCISION, MELANOMA
Anesthesia: General | Site: Back

## 2023-06-19 MED ORDER — CHLORHEXIDINE GLUCONATE CLOTH 2 % EX PADS: 6.0000 | MEDICATED_PAD | Freq: Once | CUTANEOUS | Status: DC

## 2023-06-19 MED ORDER — VASOPRESSIN 20 UNIT/ML IV SOLN
INTRAVENOUS | Status: DC | PRN
  Administered 2023-06-19 (×6): 1 [IU] via INTRAVENOUS

## 2023-06-19 MED ORDER — ORAL CARE MOUTH RINSE: 15.0000 mL | Freq: Once | OROMUCOSAL | Status: AC

## 2023-06-19 MED ORDER — FENTANYL CITRATE (PF) 250 MCG/5ML IJ SOLN
INTRAMUSCULAR | Status: DC | PRN
  Administered 2023-06-19 (×2): 50 ug via INTRAVENOUS
  Administered 2023-06-19: 100 ug via INTRAVENOUS

## 2023-06-19 MED ORDER — LIDOCAINE HCL (PF) 1 % IJ SOLN
INTRAMUSCULAR | Status: DC | PRN
  Administered 2023-06-19: 10 mL via SURGICAL_CAVITY
  Administered 2023-06-19: 45 mL via SURGICAL_CAVITY

## 2023-06-19 MED ORDER — METHYLENE BLUE (ANTIDOTE) 1 % IV SOLN
INTRAVENOUS | Status: DC | PRN
  Administered 2023-06-19: 1 mL

## 2023-06-19 MED ORDER — PROPOFOL 10 MG/ML IV BOLUS
INTRAVENOUS | Status: DC | PRN
  Administered 2023-06-19: 30 mg via INTRAVENOUS
  Administered 2023-06-19: 170 mg via INTRAVENOUS

## 2023-06-19 MED ORDER — TECHNETIUM TC 99M TILMANOCEPT KIT
0.5000 | PACK | Freq: Once | INTRAVENOUS | Status: AC | PRN
  Administered 2023-06-19: 0.5 via INTRADERMAL

## 2023-06-19 MED ORDER — PROPOFOL 10 MG/ML IV BOLUS
INTRAVENOUS | Status: AC
  Filled 2023-06-19: qty 20

## 2023-06-19 MED ORDER — FENTANYL CITRATE (PF) 250 MCG/5ML IJ SOLN
INTRAMUSCULAR | Status: AC
  Filled 2023-06-19: qty 5

## 2023-06-19 MED ORDER — ACETAMINOPHEN 500 MG PO TABS
1000.0000 mg | ORAL_TABLET | Freq: Once | ORAL | Status: AC
  Administered 2023-06-19: 1000 mg via ORAL
  Filled 2023-06-19: qty 2

## 2023-06-19 MED ORDER — ONDANSETRON HCL 4 MG/2ML IJ SOLN
INTRAMUSCULAR | Status: DC | PRN
  Administered 2023-06-19: 4 mg via INTRAVENOUS

## 2023-06-19 MED ORDER — SUCCINYLCHOLINE CHLORIDE 200 MG/10ML IV SOSY
PREFILLED_SYRINGE | INTRAVENOUS | Status: DC | PRN
  Administered 2023-06-19: 140 mg via INTRAVENOUS

## 2023-06-19 MED ORDER — LIDOCAINE 2% (20 MG/ML) 5 ML SYRINGE
INTRAMUSCULAR | Status: DC | PRN
  Administered 2023-06-19: 100 mg via INTRAVENOUS

## 2023-06-19 MED ORDER — BUPIVACAINE-EPINEPHRINE (PF) 0.5% -1:200000 IJ SOLN
INTRAMUSCULAR | Status: DC | PRN
  Administered 2023-06-19: 30 mL via PERINEURAL

## 2023-06-19 MED ORDER — OXYCODONE HCL 5 MG PO TABS
5.0000 mg | ORAL_TABLET | Freq: Four times a day (QID) | ORAL | 0 refills | Status: DC | PRN
  Filled 2023-06-19: qty 20, 4d supply, fill #0

## 2023-06-19 MED ORDER — ROCURONIUM BROMIDE 10 MG/ML (PF) SYRINGE
PREFILLED_SYRINGE | INTRAVENOUS | Status: DC | PRN
  Administered 2023-06-19: 20 mg via INTRAVENOUS

## 2023-06-19 MED ORDER — CHLORHEXIDINE GLUCONATE 0.12 % MT SOLN
15.0000 mL | Freq: Once | OROMUCOSAL | Status: AC
  Administered 2023-06-19: 15 mL via OROMUCOSAL
  Filled 2023-06-19: qty 15

## 2023-06-19 MED ORDER — DEXAMETHASONE SODIUM PHOSPHATE 10 MG/ML IJ SOLN
INTRAMUSCULAR | Status: DC | PRN
  Administered 2023-06-19: 10 mg via INTRAVENOUS

## 2023-06-19 MED ORDER — PHENYLEPHRINE HCL-NACL 20-0.9 MG/250ML-% IV SOLN
INTRAVENOUS | Status: DC | PRN
  Administered 2023-06-19: 30 ug/min via INTRAVENOUS

## 2023-06-19 MED ORDER — MIDAZOLAM HCL 2 MG/2ML IJ SOLN
INTRAMUSCULAR | Status: AC
  Filled 2023-06-19: qty 2

## 2023-06-19 MED ORDER — LIDOCAINE HCL (PF) 1 % IJ SOLN
INTRAMUSCULAR | Status: AC
  Filled 2023-06-19: qty 30

## 2023-06-19 MED ORDER — LACTATED RINGERS IV SOLN: INTRAVENOUS | Status: DC

## 2023-06-19 MED ORDER — 0.9 % SODIUM CHLORIDE (POUR BTL) OPTIME
TOPICAL | Status: DC | PRN
Start: 2023-06-19 — End: 2023-06-19
  Administered 2023-06-19: 1000 mL

## 2023-06-19 MED ORDER — CEFAZOLIN SODIUM-DEXTROSE 2-4 GM/100ML-% IV SOLN
2.0000 g | INTRAVENOUS | Status: AC
  Administered 2023-06-19: 2 g via INTRAVENOUS
  Filled 2023-06-19: qty 100

## 2023-06-19 MED ORDER — METHYLENE BLUE (ANTIDOTE) 1 % IV SOLN
INTRAVENOUS | Status: AC
  Filled 2023-06-19: qty 10

## 2023-06-19 MED ORDER — EPHEDRINE SULFATE-NACL 50-0.9 MG/10ML-% IV SOSY
PREFILLED_SYRINGE | INTRAVENOUS | Status: DC | PRN
  Administered 2023-06-19: 5 mg via INTRAVENOUS
  Administered 2023-06-19 (×2): 10 mg via INTRAVENOUS

## 2023-06-19 MED ORDER — SUGAMMADEX SODIUM 200 MG/2ML IV SOLN
INTRAVENOUS | Status: AC
  Filled 2023-06-19: qty 2

## 2023-06-19 MED ORDER — MIDAZOLAM HCL 2 MG/2ML IJ SOLN
INTRAMUSCULAR | Status: DC | PRN
  Administered 2023-06-19: 2 mg via INTRAVENOUS

## 2023-06-19 MED ORDER — OXYCODONE HCL 5 MG PO TABS
5.0000 mg | ORAL_TABLET | Freq: Four times a day (QID) | ORAL | 0 refills | Status: DC | PRN
  Filled 2023-06-19: qty 15, 4d supply, fill #0

## 2023-06-19 MED ORDER — BUPIVACAINE-EPINEPHRINE (PF) 0.25% -1:200000 IJ SOLN
INTRAMUSCULAR | Status: AC
  Filled 2023-06-19: qty 30

## 2023-06-19 SURGICAL SUPPLY — 47 items
BENZOIN TINCTURE PRP APPL 2/3 (GAUZE/BANDAGES/DRESSINGS) IMPLANT
BNDG COHESIVE 4X5 TAN STRL LF (GAUZE/BANDAGES/DRESSINGS) IMPLANT
CANISTER SUCT 3000ML PPV (MISCELLANEOUS) ×2 IMPLANT
CHLORAPREP W/TINT 26 (MISCELLANEOUS) ×2 IMPLANT
CLIP TI MEDIUM 24 (CLIP) ×2 IMPLANT
CLIP TI WIDE RED SMALL 24 (CLIP) ×2 IMPLANT
CNTNR URN SCR LID CUP LEK RST (MISCELLANEOUS) ×6 IMPLANT
COVER MAYO STAND STRL (DRAPES) IMPLANT
COVER PROBE W GEL 5X96 (DRAPES) ×2 IMPLANT
COVER SURGICAL LIGHT HANDLE (MISCELLANEOUS) ×2 IMPLANT
DERMABOND ADVANCED .7 DNX12 (GAUZE/BANDAGES/DRESSINGS) IMPLANT
DRAPE HALF SHEET 40X57 (DRAPES) ×2 IMPLANT
DRAPE LAPAROSCOPIC ABDOMINAL (DRAPES) ×2 IMPLANT
DRSG TEGADERM 4X10 (GAUZE/BANDAGES/DRESSINGS) IMPLANT
DRSG TEGADERM 4X4.75 (GAUZE/BANDAGES/DRESSINGS) ×4 IMPLANT
ELECT REM PT RETURN 9FT ADLT (ELECTROSURGICAL) ×2 IMPLANT
ELECTRODE REM PT RTRN 9FT ADLT (ELECTROSURGICAL) ×2 IMPLANT
GAUZE SPONGE 2X2 8PLY STRL LF (GAUZE/BANDAGES/DRESSINGS) ×2 IMPLANT
GAUZE SPONGE 4X4 12PLY STRL (GAUZE/BANDAGES/DRESSINGS) IMPLANT
GLOVE BIO SURGEON STRL SZ 6 (GLOVE) ×2 IMPLANT
GLOVE INDICATOR 6.5 STRL GRN (GLOVE) ×2 IMPLANT
GOWN STRL REUS W/ TWL LRG LVL3 (GOWN DISPOSABLE) ×4 IMPLANT
GOWN STRL REUS W/ TWL XL LVL3 (GOWN DISPOSABLE) ×4 IMPLANT
KIT BASIN OR (CUSTOM PROCEDURE TRAY) ×2 IMPLANT
KIT TURNOVER KIT B (KITS) ×2 IMPLANT
LIGHT WAVEGUIDE WIDE FLAT (MISCELLANEOUS) IMPLANT
MARKER SKIN DUAL TIP RULER LAB (MISCELLANEOUS) ×2 IMPLANT
NDL 18GX1X1/2 (RX/OR ONLY) (NEEDLE) ×2 IMPLANT
NDL 22X1.5 STRL (OR ONLY) (MISCELLANEOUS) ×2 IMPLANT
NDL HYPO 25GX1X1/2 BEV (NEEDLE) ×2 IMPLANT
NEEDLE 18GX1X1/2 (RX/OR ONLY) (NEEDLE) ×2 IMPLANT
NEEDLE 22X1.5 STRL (OR ONLY) (MISCELLANEOUS) ×2 IMPLANT
NEEDLE HYPO 25GX1X1/2 BEV (NEEDLE) ×2 IMPLANT
NS IRRIG 1000ML POUR BTL (IV SOLUTION) ×2 IMPLANT
PACK GENERAL/GYN (CUSTOM PROCEDURE TRAY) ×2 IMPLANT
PACK UNIVERSAL I (CUSTOM PROCEDURE TRAY) IMPLANT
PAD ARMBOARD POSITIONER FOAM (MISCELLANEOUS) ×4 IMPLANT
STOCKINETTE IMPERVIOUS 9X36 MD (GAUZE/BANDAGES/DRESSINGS) ×2 IMPLANT
STRIP CLOSURE SKIN 1/2X4 (GAUZE/BANDAGES/DRESSINGS) ×2 IMPLANT
SUT ETHILON 2 0 PSLX (SUTURE) ×2 IMPLANT
SUT MNCRL AB 4-0 PS2 18 (SUTURE) ×2 IMPLANT
SUT SILK 2 0 PERMA HAND 18 BK (SUTURE) ×2 IMPLANT
SUT VIC AB 2-0 SH 27XBRD (SUTURE) ×4 IMPLANT
SUT VIC AB 3-0 SH 27X BRD (SUTURE) ×4 IMPLANT
SUT VIC AB 3-0 SH 8-18 (SUTURE) IMPLANT
SYR CONTROL 10ML LL (SYRINGE) ×4 IMPLANT
TOWEL GREEN STERILE FF (TOWEL DISPOSABLE) ×2 IMPLANT

## 2023-06-19 NOTE — Anesthesia Postprocedure Evaluation (Signed)
 Anesthesia Post Note  Patient: Aaron Moore  Procedure(s) Performed: EXCISION, MELANOMA (Back) BIOPSY, LYMPH NODE, SENTINEL (Left: Axilla)     Patient location during evaluation: PACU Anesthesia Type: General Level of consciousness: awake and alert Pain management: pain level controlled Vital Signs Assessment: post-procedure vital signs reviewed and stable Respiratory status: spontaneous breathing, nonlabored ventilation, respiratory function stable and patient connected to nasal cannula oxygen Cardiovascular status: blood pressure returned to baseline and stable Postop Assessment: no apparent nausea or vomiting Anesthetic complications: no   No notable events documented.  Last Vitals:  Vitals:   06/19/23 1630 06/19/23 1645  BP: 125/84 (!) 138/92  Pulse: 74 74  Resp: 18 18  Temp:  36.7 C  SpO2: 99% 93%    Last Pain:  Vitals:   06/19/23 1645  TempSrc:   PainSc: 0-No pain                 [place]

## 2023-06-19 NOTE — Discharge Instructions (Addendum)
 [facility] Office Phone Number [phone]   POST OP INSTRUCTIONS  Always review your discharge instruction sheet given to you by the facility where your surgery was performed.  IF YOU HAVE DISABILITY OR FAMILY LEAVE FORMS, YOU MUST BRING THEM TO THE OFFICE FOR PROCESSING.  DO NOT GIVE THEM TO YOUR DOCTOR.  Take 2 tylenol (acetominophen) three times a day for 3 days.  If you still have pain, add ibuprofen with food in between if able to take this (if you have kidney issues or stomach issues, do not take ibuprofen).  If both of those are not enough, add the narcotic pain pill.  If you find you are needing a lot of this overnight after surgery, call the next morning for a refill.   Take your usually prescribed medications unless otherwise directed If you need a refill on your pain medication, please contact your pharmacy.  They will contact our office to request authorization.  Prescriptions will not be filled after 5pm or on week-ends. You should eat very light the first 24 hours after surgery, such as soup, crackers, pudding, etc.  Resume your normal diet the day after surgery It is common to experience some constipation if taking pain medication after surgery.  Increasing fluid intake and taking a stool softener will usually help or prevent this problem from occurring.  A mild laxative (Milk of Magnesia or Miralax) should be taken according to package directions if there are no bowel movements after 48 hours. You may shower in 48 hours.  The surgical glue will flake off in 2-3 weeks.   ACTIVITIES:  No strenuous activity or heavy lifting for 1 week.   You may drive when you no longer are taking prescription pain medication, you can comfortably wear a seatbelt, and you can safely maneuver your car and apply brakes. RETURN TO WORK:  __________to be determined, but NO lifting for 2 weeks over 10 pounds. _______________ Aaron Moore should see your doctor in the office for a follow-up  appointment approximately three-four weeks after your surgery.    WHEN TO CALL YOUR DOCTOR: Fever over 101.0 Nausea and/or vomiting. Extreme swelling or bruising. Continued bleeding from incision. Increased pain, redness, or drainage from the incision.  The clinic staff is available to answer your questions during regular business hours.  Please don't hesitate to call and ask to speak to one of the nurses for clinical concerns.  If you have a medical emergency, go to the nearest emergency room or call 911.  A surgeon from [facility] is always on call at the hospital.  For further questions, please visit [email or web address]

## 2023-06-19 NOTE — Anesthesia Procedure Notes (Addendum)
 Procedure Name: Intubation Date/Time: 06/19/2023 1:25 PM  Performed by: Colbert Coyer, CRNAPre-anesthesia Checklist: Patient identified, Emergency Drugs available, Suction available and Patient being monitored Patient Re-evaluated:Patient Re-evaluated prior to induction Oxygen Delivery Method: Circle System Utilized Preoxygenation: Pre-oxygenation with 100% oxygen Induction Type: IV induction Ventilation: Two handed mask ventilation required Laryngoscope Size: Glidescope and 4 Grade View: Grade I Tube type: Oral Tube size: 7.5 mm Number of attempts: 1 Airway Equipment and Method: Stylet Placement Confirmation: ETT inserted through vocal cords under direct vision, positive ETCO2 and breath sounds checked- equal and bilateral Secured at: 22 cm Tube secured with: Tape Dental Injury: Teeth and Oropharynx as per pre-operative assessment  Comments: Intubation by Jarvis Newcomer

## 2023-06-19 NOTE — Anesthesia Procedure Notes (Addendum)
 Anesthesia Regional Block: Pectoralis block   Pre-Anesthetic Checklist: , timeout performed,  Correct Patient, Correct Site, Correct Laterality,  Correct Procedure, Correct Position, site marked,  Risks and benefits discussed,  Surgical consent,  Pre-op evaluation,  At surgeon's request and post-op pain management  Laterality: Left  Prep: chloraprep       Needles:  Injection technique: Single-shot  Needle Type: Echogenic Needle     Needle Length: 9cm  Needle Gauge: 21     Additional Needles:   Procedures:,,,, ultrasound used (permanent image in chart),,    Narrative:  Start time: 06/19/2023 1:02 PM End time: 06/19/2023 1:07 PM Injection made incrementally with aspirations every 5 mL.  Performed by: Personally  Anesthesiologist: Collene Schlichter, MD  Additional Notes: No pain on injection. No increased resistance to injection. Injection made in 5cc increments.  Good needle visualization.  Patient tolerated procedure well.

## 2023-06-19 NOTE — Interval H&P Note (Signed)
 History and Physical Interval Note:  06/19/2023 1:13 PM  Aaron Moore  has presented today for surgery, with the diagnosis of BACK MELANOMA.  The various methods of treatment have been discussed with the patient and family. After consideration of risks, benefits and other options for treatment, the patient has consented to  Procedure(s) with comments: EXCISION, MELANOMA (N/A) - WIDE LOCAL EXCISION BACK MELANOMA WITH ADVANCEMENT FLAP CLOSURE WITH SENTINEL NODE BIOPSY BIOPSY, LYMPH NODE, SENTINEL (N/A) as a surgical intervention.  The patient's history has been reviewed, patient examined, no change in status, stable for surgery.  I have reviewed the patient's chart and labs.  Questions were answered to the patient's satisfaction.     [name]

## 2023-06-19 NOTE — Transfer of Care (Signed)
 Immediate Anesthesia Transfer of Care Note  Patient: Aaron Moore  Procedure(s) Performed: EXCISION, MELANOMA (Back) BIOPSY, LYMPH NODE, SENTINEL (Left: Axilla)  Patient Location: PACU  Anesthesia Type:General  Level of Consciousness: drowsy  Airway & Oxygen Therapy: Patient Spontanous Breathing, Patient connected to face mask oxygen, and oral airway  Post-op Assessment: Report given to RN and Post -op Vital signs reviewed and stable  Post vital signs: Reviewed and stable  Last Vitals:  Vitals Value Taken Time  BP 129/84 06/19/23 1615  Temp 36.6 C 06/19/23 1612  Pulse 69 06/19/23 1616  Resp 13 06/19/23 1616  SpO2 97 % 06/19/23 1616  Vitals shown include unfiled device data.  Last Pain:  Vitals:   06/19/23 1224  TempSrc:   PainSc: 0-No pain      Patients Stated Pain Goal: 0 (06/19/23 1224)  Complications: No notable events documented.

## 2023-06-19 NOTE — Op Note (Signed)
 PRE-OPERATIVE DIAGNOSIS: cT4N0 left upper back melanoma  POST-OPERATIVE DIAGNOSIS:  Same  PROCEDURE:  Procedure(s): Wide local excision 1-2 cm margins, advancement flap closure for defect 10 cm x 6 cm, left axillary sentinel lymph node mapping and biopsy  SURGEON:  Surgeon(s): [name], MD  ASSIST:  Gevena Cotton, RNFA  ANESTHESIA:   local and general  DRAINS: none   LOCAL MEDICATIONS USED:  MARCAINE    and XYLOCAINE   SPECIMEN:  Source of Specimen:  left axillary sentinel lymph nodes, wide local excision left upper back melanoma   FINDINGS:  no gross residual disease, soft lymph nodes.  SLN #1 hot cps 1200, SLN #2 hot, cps 1700, SLN #3 hot, cps 700. Background cps 50.  DISPOSITION OF SPECIMEN:  PATHOLOGY  COUNTS:  YES  PLAN OF CARE: Discharge to home after PACU  PATIENT DISPOSITION:  PACU - hemodynamically stable.    PROCEDURE:   Pt was identified in the holding area, taken to the OR, and placed supine on the OR table.  General anesthesia was induced.  Time out was performed according to the surgical safety checklist.  When all was correct, we continued.  One mL methylene blue was injected intradermally around the melanoma biopsy site.    The patient was placed into the right lateral decubitus position with appropriate padding.  The left upper back was prepped and draped in sterile fashion.  The melanoma was identified and 1-2 cm margins were marked out.  Local was administered under the melanoma and the adjacent tissue.  A #10 blade was used to incise the skin around the melanoma.  The cautery was used to take the dissection down to the fascia.  The skin was marked in situ with orientation sutures.  The cautery was used to take the specimen off the fascia, and it was passed off the table.    Skin hooks were used to elevate the edges of the incision and the skin was freed up in all directions.  This was pulled together in an oblique orientation. The skin was pulled together  to check the tension. . Deep interrupted 2-0 vicryl sutures were placed to relieve tension.  The skin was then reapproximated with 3-0 interrupted vicryl deep dermal sutures and 4-0 monocryl running subcuticular sutures.  Three 2-0 nylon horizontal mattress sutures were placed as well.  This was dressed with benzoin, steristrips, gauze, and tegaderm.    The patient was placed back supine.  The patient's left axilla, upper arm, and upper lateral chest were prepped and draped in sterile fashion.  The point of maximum signal intensity was identified with the neoprobe.  A 4 cm incision was made with a #15 blade.  The subcutaneous tissues were divided with the cautery.  A Weitlaner retractor was used to assist with visualization.  The tonsil clamp was used to bluntly dissect the axillary fat pad.  Three deep sentinel lymph nodes were identified as described above.  The lymphovascular channels were clipped with hemoclips.  The nodes were passed off as specimens.  Hemostasis was achieved with the cautery.  The axilla was irrigated and closed with 3-0 Vicryl deep dermal interrupted sutures and 4-0 Monocryl running subcuticular suture.  The axilla was dressed with dermabond.    The melanoma site was cleaned, dried, and dressed with Benzoin, steristrips, gauze, and tegaderm.    Needle, sponge, and instrument counts were correct.  The patient was awakened from anesthesia and taken to the PACU in stable condition.

## 2023-06-20 ENCOUNTER — Encounter (HOSPITAL_COMMUNITY): Payer: Self-pay | Admitting: General Surgery

## 2023-06-27 ENCOUNTER — Telehealth: Payer: Self-pay | Admitting: General Surgery

## 2023-06-27 NOTE — Telephone Encounter (Signed)
 Discussed pathology with wife. Pt already had PET.  Will communicate with med onc.

## 2023-07-08 NOTE — Progress Notes (Unsigned)
 [facility] Cancer Follow up:    Aaron Reichmann, DO [address] [place] [address] [place] [address]   DIAGNOSIS:  Cancer Staging  Cancer of tonsillar fossa (HCC) Staging form: Pharynx - HPV-Mediated Oropharynx, AJCC 8th Edition - Clinical stage from 05/08/2022: Stage II (cT3, cN1, cM0, p16+) - Signed by Lonie Peak, MD on 05/08/2022 Stage prefix: Initial diagnosis   SUMMARY OF ONCOLOGIC HISTORY: Oncology History  Cancer of tonsillar fossa (HCC)  05/08/2022 Initial Diagnosis   Cancer of tonsillar fossa (HCC)   05/08/2022 Cancer Staging   Staging form: Pharynx - HPV-Mediated Oropharynx, AJCC 8th Edition - Clinical stage from 05/08/2022: Stage II (cT3, cN1, cM0, p16+) - Signed by Lonie Peak, MD on 05/08/2022 Stage prefix: Initial diagnosis   06/05/2022 -  Chemotherapy   Patient is on Treatment Plan : HEAD/NECK Cisplatin (40) q7d       CURRENT THERAPY: Concurrent chemoradiation  INTERVAL HISTORY:  Aaron Moore 58 y.o. male returns for follow-up.   Patient is well-known to me.  We have treated him for cancer of tonsillar fossa HPV mediated in 2024 with chemoradiation.  He most recently had a growing mole on his back and went to see his dermatologist.  Incisional biopsy was done.  Left upper back biopsy showed malignant melanoma, Breslow's depth 9 mm, Clark's level 4, lateral margins and deep margin involved.  He is now back in the medical oncology clinic for additional recommendations.  His wife said he had a mole on his back for almost 20 years but only since June of last year that the mole started to change.  She also has history of prostate cancer and cancer of the proximal left also.  He is not as interested in genetic testing, he does not have any children.  There is also no family history of any cancer on both sides.  He denies any major changes in cough, chest pain, shortness of breath, any neurological symptoms or change in bowel or urinary habits  Rest of the  pertinent 10 point ROS reviewed and negative  Wt Readings from Last 3 Encounters:  06/19/23 260 lb (117.9 kg)  06/13/23 256 lb 6.4 oz (116.3 kg)  05/22/23 271 lb 14.4 oz (123.3 kg)     Patient Active Problem List   Diagnosis Date Noted  . Port-A-Cath in place 06/04/2022  . Cancer of tonsillar fossa (HCC) 05/08/2022  . Prostate cancer (HCC) 02/07/2022  . Medial epicondylitis of left elbow 03/08/2015  . Obesity, Class II, BMI 35-39.9, with comorbidity 11/28/2010  . Abdominal wall mass of epigastric region - old hernia sac/lipoma 11/28/2010  . Abdominal pain, RLQ - muscle strain 11/28/2010    has no known allergies.  MEDICAL HISTORY: Past Medical History:  Diagnosis Date  . Abdominal pain   . Cancer [facility])    prostate cancer, throat cancer, melanoma to back  . Complication of anesthesia    slow to wake up 3 years ago after foot surgery  . ED (erectile dysfunction)   . Gout   . Gout   . History of kidney stones   . Hypertension   . Obesity, Class II, BMI 35-39.9, with comorbidity   . Painful orthopaedic hardware [facility])    left foot    SURGICAL HISTORY: Past Surgical History:  Procedure Laterality Date  . amputation of 2 toes on left foot     . BREAST SURGERY    . CARPAL TUNNEL RELEASE  05/03/2011   Procedure: CARPAL TUNNEL RELEASE;  Surgeon:  Nicki Reaper, MD;  Location: [facility];  Service: Orthopedics;  Laterality: Left;  . FOOT FRACTURE SURGERY Left    x3  . HARDWARE REMOVAL Left 06/03/2019   Procedure: HARDWARE REMOVAL;  Surgeon: Terance Hart, MD;  Location: [facility];  Service: Orthopedics;  Laterality: Left;  PROCEDURE: LEFT FOOT REMOVAL OF HARDWARE, IRRIGATION AND DEBRIDEMENT,PLACEMENT OF CEMENT SPACER, DEBULKIN, SOFT TISSUE MASS LENGTH OF SURGERY: 2 HOURS  . HERNIA REPAIR  02/06/2009   Lap supraumb & umb VWH repairs  . INCISION AND DRAINAGE OF WOUND Left 06/03/2019   Procedure: IRRIGATION AND DEBRIDEMENT WOUND WITH  IMPLANTATION OF ANTIBIOTIC CEMENT SPACER;  Surgeon: Terance Hart, MD;  Location: [facility];  Service: Orthopedics;  Laterality: Left;  . IR GASTROSTOMY TUBE MOD SED  05/29/2022  . IR GASTROSTOMY TUBE REMOVAL  08/28/2022  . IR IMAGING GUIDED PORT INSERTION  05/29/2022  . IR REMOVAL TUN ACCESS W/ PORT W/O FL MOD SED  11/04/2022  . left rotator cuff surgery     . LYMPHADENECTOMY Bilateral 02/07/2022   Procedure: BILATERAL PELVIC LYMPHADENECTOMY;  Surgeon: Heloise Purpura, MD;  Location: [facility];  Service: Urology;  Laterality: Bilateral;  . MASS EXCISION Left 06/03/2019   Procedure: DEBULKING LEFT FOOT MASS;  Surgeon: Terance Hart, MD;  Location: [facility];  Service: Orthopedics;  Laterality: Left;  Marland Kitchen MELANOMA EXCISION N/A 06/19/2023   Procedure: EXCISION, MELANOMA;  Surgeon: [name], MD;  Location: [facility];  Service: General;  Laterality: N/A;  WIDE LOCAL EXCISION BACK MELANOMA WITH ADVANCEMENT FLAP CLOSURE WITH SENTINEL NODE BIOPSY  . ROBOT ASSISTED LAPAROSCOPIC RADICAL PROSTATECTOMY N/A 02/07/2022   Procedure: XI ROBOTIC ASSISTED LAPAROSCOPIC RADICAL PROSTATECTOMY LEVEL 3;  Surgeon: Heloise Purpura, MD;  Location: [facility];  Service: Urology;  Laterality: N/A;  210 MINUTES NEEDED FOR CASE  . SENTINEL NODE BIOPSY Left 06/19/2023   Procedure: BIOPSY, LYMPH NODE, SENTINEL;  Surgeon: [name], MD;  Location: [facility];  Service: General;  Laterality: Left;    SOCIAL HISTORY: Social History   Socioeconomic History  . Marital status: Married    Spouse name: Not on file  . Number of children: Not on file  . Years of education: Not on file  . Highest education level: Not on file  Occupational History  . Not on file  Tobacco Use  . Smoking status: Never  . Smokeless tobacco: Never  Vaping Use  . Vaping status: Never Used  Substance and Sexual Activity  . Alcohol use: No  . Drug use: No  . Sexual activity: Not on file  Other Topics Concern  .  Not on file  Social History Narrative  . Not on file   Social Drivers of Health   Financial Resource Strain: Not on file  Food Insecurity: No Food Insecurity (02/07/2022)   Hunger Vital Sign   . Worried About Programme researcher, broadcasting/film/video in the Last Year: Never true   . Ran Out of Food in the Last Year: Never true  Transportation Needs: No Transportation Needs (02/07/2022)   PRAPARE - Transportation   . Lack of Transportation (Medical): No   . Lack of Transportation (Non-Medical): No  Physical Activity: Not on file  Stress: Not on file  Social Connections: Unknown (05/01/2022)   Received from [facility], [facility]   Social Network   . Social Network: Not on file  Intimate Partner Violence: Unknown (05/01/2022)   Received from [facility], [facility]  HITS   . Physically Hurt: Not on file   . Insult or Talk Down To: Not on file   . Threaten Physical Harm: Not on file   . Scream or Curse: Not on file    FAMILY HISTORY: Family History  Problem Relation Age of Onset  . Diabetes Mother   . Diabetes Father     PHYSICAL EXAMINATION  There were no vitals taken for this visit.   There were no vitals filed for this visit.    Physical Exam Constitutional:      General: He is not in acute distress.    Appearance: Normal appearance. He is not toxic-appearing.  HENT:     Head: Normocephalic and atraumatic.     Mouth/Throat:     Mouth: Mucous membranes are moist.  Eyes:     General: No scleral icterus. Cardiovascular:     Rate and Rhythm: Normal rate and regular rhythm.     Pulses: Normal pulses.     Heart sounds: Normal heart sounds.  Pulmonary:     Effort: Pulmonary effort is normal.     Breath sounds: Normal breath sounds.  Abdominal:     General: Abdomen is flat. Bowel sounds are normal. There is no distension.     Palpations: Abdomen is soft.     Tenderness: There is no abdominal tenderness.  Musculoskeletal:        General: No swelling.     Cervical back:  Neck supple.  Lymphadenopathy:     Cervical: No cervical adenopathy.  Skin:    General: Skin is warm and dry.     Findings: No rash.  Neurological:     General: No focal deficit present.     Mental Status: He is alert.  Psychiatric:        Mood and Affect: Mood normal.        Behavior: Behavior normal.    LABORATORY DATA:  CBC    Component Value Date/Time   WBC 6.0 06/13/2023 0835   RBC 4.78 06/13/2023 0835   HGB 13.8 06/13/2023 0835   HGB 8.5 (L) 08/01/2022 1054   HCT 42.4 06/13/2023 0835   PLT 239 06/13/2023 0835   PLT 210 08/01/2022 1054   MCV 88.7 06/13/2023 0835   MCH 28.9 06/13/2023 0835   MCHC 32.5 06/13/2023 0835   RDW 14.0 06/13/2023 0835   LYMPHSABS 0.3 (L) 09/04/2022 1550   MONOABS 1.2 (H) 09/04/2022 1550   EOSABS 0.1 09/04/2022 1550   BASOSABS 0.0 09/04/2022 1550    CMP     Component Value Date/Time   NA 142 06/13/2023 0835   K 4.1 06/13/2023 0835   CL 107 06/13/2023 0835   CO2 27 06/13/2023 0835   GLUCOSE 101 (H) 06/13/2023 0835   BUN 15 06/13/2023 0835   CREATININE 1.25 (H) 06/13/2023 0835   CREATININE 1.02 08/01/2022 1054   CALCIUM 9.2 06/13/2023 0835   PROT 7.2 09/04/2022 1550   ALBUMIN 4.2 09/04/2022 1550   AST 13 (L) 09/04/2022 1550   AST 13 (L) 08/01/2022 1054   ALT 13 09/04/2022 1550   ALT 14 08/01/2022 1054   ALKPHOS 98 09/04/2022 1550   BILITOT 0.7 09/04/2022 1550   BILITOT 0.5 08/01/2022 1054   GFRNONAA >60 06/13/2023 0835   GFRNONAA >60 08/01/2022 1054   GFRAA >60 10/21/2017 2005     ASSESSMENT & PLAN:  This is a very pleasant 58 year old male patient with new diagnosis of squamous cell carcinoma of the tonsil status post  concurrent chemoradiation, history of prostate cancer status post radical prostatectomy now with a new diagnosis of malignant melanoma of his upper back. He had a chronic mole for the past 20 years but has noticed some change in it in the past 6 months, the mole started growing and hence went to see his  dermatologist, had an incision and a biopsy which showed malignant melanoma, Clark's level 4, all margins involved at least staged as a pT4a now back to medical oncology for additional recommendations.  We have discussed the next steps which includes a whole-body PET/CT for staging, surgery referral for complete excision and possibly sentinel lymph node biopsy.  We have also discussed about role of adjuvant immunotherapy to reduce the risk of melanoma recurrence as well as metastatic disease.  He tolerated chemoradiation extremely well.  He is willing to proceed with all treatment recommendations as we discussed with you.  PET/CT ordered, referral placed to Dr. Donell Beers.  I will hope to see him back in approximately 3 to 4 weeks for follow-up.  I will plan to give him adjuvant Keytruda for a year as long as there is no evidence of metastatic disease.  Total encounter time: 40 minutes*in face-to-face visit time, chart review, lab review, care coordination, order entry, and documentation of the encounter time.  *Total Encounter Time as defined by the Centers for Medicare and Medicaid Services includes, in addition to the face-to-face time of a patient visit (documented in the note above) non-face-to-face time: obtaining and reviewing outside history, ordering and reviewing medications, tests or procedures, care coordination (communications with other health care professionals or caregivers) and documentation in the medical record.

## 2023-07-09 ENCOUNTER — Inpatient Hospital Stay: Attending: Hematology and Oncology | Admitting: Hematology and Oncology

## 2023-07-09 ENCOUNTER — Ambulatory Visit: Admitting: Hematology and Oncology

## 2023-07-09 DIAGNOSIS — Z85819 Personal history of malignant neoplasm of unspecified site of lip, oral cavity, and pharynx: Secondary | ICD-10-CM

## 2023-07-09 DIAGNOSIS — C4359 Malignant melanoma of other part of trunk: Secondary | ICD-10-CM | POA: Diagnosis not present

## 2023-07-09 DIAGNOSIS — C090 Malignant neoplasm of tonsillar fossa: Secondary | ICD-10-CM | POA: Insufficient documentation

## 2023-07-09 DIAGNOSIS — Z5112 Encounter for antineoplastic immunotherapy: Secondary | ICD-10-CM | POA: Insufficient documentation

## 2023-07-09 DIAGNOSIS — Z9221 Personal history of antineoplastic chemotherapy: Secondary | ICD-10-CM | POA: Insufficient documentation

## 2023-07-09 DIAGNOSIS — Z923 Personal history of irradiation: Secondary | ICD-10-CM | POA: Diagnosis not present

## 2023-07-09 DIAGNOSIS — Z8546 Personal history of malignant neoplasm of prostate: Secondary | ICD-10-CM

## 2023-07-10 ENCOUNTER — Encounter: Payer: Self-pay | Admitting: Hematology and Oncology

## 2023-07-11 ENCOUNTER — Telehealth: Payer: Self-pay | Admitting: *Deleted

## 2023-07-11 NOTE — Telephone Encounter (Signed)
 This RN spoke with the patient's wife per her call stating they have not been called for an appt for next Friday as discussed on phone visit.  This RN reviewed MD note and scheduled pt appropriately for 07/18/2023.  She also inquired about the infusion appt and would like if possible to start on 4/25 early afternoon.  This note will be forwarded to MD for review of above for upcoming appts.

## 2023-07-17 ENCOUNTER — Telehealth: Payer: Self-pay

## 2023-07-17 NOTE — Telephone Encounter (Signed)
 Left message on home phone about appointment on 07/18/23

## 2023-07-18 ENCOUNTER — Inpatient Hospital Stay: Admitting: Hematology and Oncology

## 2023-07-18 ENCOUNTER — Encounter: Payer: Self-pay | Admitting: Hematology and Oncology

## 2023-07-18 ENCOUNTER — Telehealth: Payer: Self-pay | Admitting: *Deleted

## 2023-07-18 VITALS — BP 165/96 | HR 89 | Temp 98.3°F | Resp 16 | Wt 237.2 lb

## 2023-07-18 DIAGNOSIS — C439 Malignant melanoma of skin, unspecified: Secondary | ICD-10-CM

## 2023-07-18 DIAGNOSIS — Z5112 Encounter for antineoplastic immunotherapy: Secondary | ICD-10-CM | POA: Diagnosis present

## 2023-07-18 DIAGNOSIS — Z923 Personal history of irradiation: Secondary | ICD-10-CM | POA: Diagnosis not present

## 2023-07-18 DIAGNOSIS — C090 Malignant neoplasm of tonsillar fossa: Secondary | ICD-10-CM | POA: Diagnosis present

## 2023-07-18 DIAGNOSIS — C4359 Malignant melanoma of other part of trunk: Secondary | ICD-10-CM | POA: Diagnosis not present

## 2023-07-18 DIAGNOSIS — Z9221 Personal history of antineoplastic chemotherapy: Secondary | ICD-10-CM | POA: Diagnosis not present

## 2023-07-18 NOTE — Progress Notes (Signed)
 Called [facility] pathology and requested BRAF for pt. Spoke with Tonya. Gave pt information as well as call back number.

## 2023-07-18 NOTE — Progress Notes (Signed)
 ON PATHWAY REGIMEN - Head and Neck  No Change  Continue With Treatment as Ordered.  Original Decision Date/Time: 05/13/2022 14:30     A cycle is every 7 days:     Cisplatin    **Always confirm dose/schedule in your pharmacy ordering system**  Patient Characteristics: Oropharynx, HPV Positive, Preoperative or Nonsurgical Candidate (Clinical Staging), cT0-4, cN1-3 or cT3-4, cN0 Disease Classification: Oropharynx HPV Status: Positive (+) Therapeutic Status: Preoperative or Nonsurgical Candidate (Clinical Staging) AJCC T Category: cT3 AJCC 8 Stage Grouping: II AJCC N Category: cN1 AJCC M Category: cM0 Intent of Therapy: Curative Intent, Discussed with Patient

## 2023-07-18 NOTE — Progress Notes (Signed)
 [facility] Cancer Follow up:    Aaron Brand, DO [address] [place] [address] [place] [address]   DIAGNOSIS:  Cancer Staging  Cancer of tonsillar fossa (HCC) Staging form: Pharynx - HPV-Mediated Oropharynx, AJCC 8th Edition - Clinical stage from 05/08/2022: Stage II (cT3, cN1, cM0, p16+) - Signed by Colie Dawes, MD on 05/08/2022 Stage prefix: Initial diagnosis   SUMMARY OF ONCOLOGIC HISTORY: Oncology History  Cancer of tonsillar fossa (HCC)  05/08/2022 Initial Diagnosis   Cancer of tonsillar fossa (HCC)   05/08/2022 Cancer Staging   Staging form: Pharynx - HPV-Mediated Oropharynx, AJCC 8th Edition - Clinical stage from 05/08/2022: Stage II (cT3, cN1, cM0, p16+) - Signed by Colie Dawes, MD on 05/08/2022 Stage prefix: Initial diagnosis   06/05/2022 - 07/17/2022 Chemotherapy   Patient is on Treatment Plan : HEAD/NECK Cisplatin  (40) q7d       CURRENT THERAPY: Concurrent chemoradiation  INTERVAL HISTORY:  Aaron Moore 58 y.o. male returns for follow-up.   Patient is well-known to me.  We have treated him for cancer of tonsillar fossa HPV mediated in 2024 with chemoradiation.  He is now here for follow up after his most recent melanoma surgery.  He is healing well. No complaints today although Mrs Lanzo says she is dis satisfied with the surgery.  Rest of the pertinent 10 point ROS reviewed and negative  Wt Readings from Last 3 Encounters:  06/19/23 260 lb (117.9 kg)  06/13/23 256 lb 6.4 oz (116.3 kg)  05/22/23 271 lb 14.4 oz (123.3 kg)     Patient Active Problem List   Diagnosis Date Noted   Port-A-Cath in place 06/04/2022   Cancer of tonsillar fossa (HCC) 05/08/2022   Prostate cancer (HCC) 02/07/2022   Medial epicondylitis of left elbow 03/08/2015   Obesity, Class II, BMI 35-39.9, with comorbidity 11/28/2010   Abdominal wall mass of epigastric region - old hernia sac/lipoma 11/28/2010   Abdominal pain, RLQ - muscle strain 11/28/2010    has no known  allergies.  MEDICAL HISTORY: Past Medical History:  Diagnosis Date   Abdominal pain    Cancer (HCC)    prostate cancer, throat cancer, melanoma to back   Complication of anesthesia    slow to wake up 3 years ago after foot surgery   ED (erectile dysfunction)    Gout    Gout    History of kidney stones    Hypertension    Obesity, Class II, BMI 35-39.9, with comorbidity    Painful orthopaedic hardware (HCC)    left foot    SURGICAL HISTORY: Past Surgical History:  Procedure Laterality Date   amputation of 2 toes on left foot      BREAST SURGERY     CARPAL TUNNEL RELEASE  05/03/2011   Procedure: CARPAL TUNNEL RELEASE;  Surgeon: Kemp Patter, MD;  Location: [facility];  Service: Orthopedics;  Laterality: Left;   FOOT FRACTURE SURGERY Left    x3   HARDWARE REMOVAL Left 06/03/2019   Procedure: HARDWARE REMOVAL;  Surgeon: Donnamarie Gables, MD;  Location: [facility];  Service: Orthopedics;  Laterality: Left;  PROCEDURE: LEFT FOOT REMOVAL OF HARDWARE, IRRIGATION AND DEBRIDEMENT,PLACEMENT OF CEMENT SPACER, DEBULKIN, SOFT TISSUE MASS LENGTH OF SURGERY: 2 HOURS   HERNIA REPAIR  02/06/2009   Lap supraumb & umb VWH repairs   INCISION AND DRAINAGE OF WOUND Left 06/03/2019   Procedure: IRRIGATION AND DEBRIDEMENT WOUND WITH IMPLANTATION OF ANTIBIOTIC CEMENT SPACER;  Surgeon: Lasandra Points  R, MD;  Location: [facility];  Service: Orthopedics;  Laterality: Left;   IR GASTROSTOMY TUBE MOD SED  05/29/2022   IR GASTROSTOMY TUBE REMOVAL  08/28/2022   IR IMAGING GUIDED PORT INSERTION  05/29/2022   IR REMOVAL TUN ACCESS W/ PORT W/O FL MOD SED  11/04/2022   left rotator cuff surgery      LYMPHADENECTOMY Bilateral 02/07/2022   Procedure: BILATERAL PELVIC LYMPHADENECTOMY;  Surgeon: Florencio Hunting, MD;  [facility];  Service: Urology;  Laterality: Bilateral;   MASS EXCISION Left 06/03/2019   Procedure: DEBULKING LEFT FOOT MASS;  Surgeon: Donnamarie Gables, MD;  Location: [facility];  Service: Orthopedics;  Laterality: Left;   MELANOMA EXCISION N/A 06/19/2023   Procedure: EXCISION, MELANOMA;  Surgeon: Lockie Rima, MD;  Location[facility];  Service: General;  Laterality: N/A;  WIDE LOCAL EXCISION BACK MELANOMA WITH ADVANCEMENT FLAP CLOSURE WITH SENTINEL NODE BIOPSY   ROBOT ASSISTED LAPAROSCOPIC RADICAL PROSTATECTOMY N/A 02/07/2022   Procedure: XI ROBOTIC ASSISTED LAPAROSCOPIC RADICAL PROSTATECTOMY LEVEL 3;  Surgeon: Florencio Hunting, MD;  Location: [facility];  Service: Urology;  Laterality: N/A;  210 MINUTES NEEDED FOR CASE   SENTINEL NODE BIOPSY Left 06/19/2023   Procedure: BIOPSY, LYMPH NODE, SENTINEL;  Surgeon: Lockie Rima, MD;  Location: [facility];  Service: General;  Laterality: Left;    SOCIAL HISTORY: Social History   Socioeconomic History   Marital status: Married    Spouse name: Not on file   Number of children: Not on file   Years of education: Not on file   Highest education level: Not on file  Occupational History   Not on file  Tobacco Use   Smoking status: Never   Smokeless tobacco: Never  Vaping Use   Vaping status: Never Used  Substance and Sexual Activity   Alcohol use: No   Drug use: No   Sexual activity: Not on file  Other Topics Concern   Not on file  Social History Narrative   Not on file   Social Drivers of Health   Financial Resource Strain: Not on file  Food Insecurity: No Food Insecurity (02/07/2022)   Hunger Vital Sign    Worried About Running Out of Food in the Last Year: Never true    Ran Out of Food in the Last Year: Never true  Transportation Needs: No Transportation Needs (02/07/2022)   PRAPARE - Administrator, Civil Service (Medical): No    Lack of Transportation (Non-Medical): No  Physical Activity: Not on file  Stress: Not on file  Social Connections: Unknown (05/01/2022)   Received from [facility], [facility]   Social Network    Social Network: Not on  file  Intimate Partner Violence: Unknown (05/01/2022)   Received from [facility], [facility]   HITS    Physically Hurt: Not on file    Insult or Talk Down To: Not on file    Threaten Physical Harm: Not on file    Scream or Curse: Not on file    FAMILY HISTORY: Family History  Problem Relation Age of Onset   Diabetes Mother    Diabetes Father     PHYSICAL EXAMINATION  There were no vitals taken for this visit.   There were no vitals filed for this visit.  He is in no distress Surgical scar appears well healed.  LABORATORY DATA:  CBC    Component Value Date/Time   WBC 6.0 06/13/2023 0835   RBC  4.78 06/13/2023 0835   HGB 13.8 06/13/2023 0835   HGB 8.5 (L) 08/01/2022 1054   HCT 42.4 06/13/2023 0835   PLT 239 06/13/2023 0835   PLT 210 08/01/2022 1054   MCV 88.7 06/13/2023 0835   MCH 28.9 06/13/2023 0835   MCHC 32.5 06/13/2023 0835   RDW 14.0 06/13/2023 0835   LYMPHSABS 0.3 (L) 09/04/2022 1550   MONOABS 1.2 (H) 09/04/2022 1550   EOSABS 0.1 09/04/2022 1550   BASOSABS 0.0 09/04/2022 1550    CMP     Component Value Date/Time   NA 142 06/13/2023 0835   K 4.1 06/13/2023 0835   CL 107 06/13/2023 0835   CO2 27 06/13/2023 0835   GLUCOSE 101 (H) 06/13/2023 0835   BUN 15 06/13/2023 0835   CREATININE 1.25 (H) 06/13/2023 0835   CREATININE 1.02 08/01/2022 1054   CALCIUM 9.2 06/13/2023 0835   PROT 7.2 09/04/2022 1550   ALBUMIN 4.2 09/04/2022 1550   AST 13 (L) 09/04/2022 1550   AST 13 (L) 08/01/2022 1054   ALT 13 09/04/2022 1550   ALT 14 08/01/2022 1054   ALKPHOS 98 09/04/2022 1550   BILITOT 0.7 09/04/2022 1550   BILITOT 0.5 08/01/2022 1054   GFRNONAA >60 06/13/2023 0835   GFRNONAA >60 08/01/2022 1054   GFRAA >60 10/21/2017 2005     ASSESSMENT & PLAN:  This is a very pleasant 58 year old male patient with new diagnosis of squamous cell carcinoma of the tonsil status post concurrent chemoradiation, history of prostate cancer status post radical  prostatectomy now with a new diagnosis of malignant melanoma of his upper back. He had a chronic mole for the past 20 years but has noticed some change in it in the past 6 months, the mole started growing and hence went to see his dermatologist, had an incision and a biopsy which showed malignant melanoma, Clark's level 4, all margins involved at least staged as a pT4a now back to medical oncology for additional recommendations.  PET CT neg for metastatic disease. He had surgical excision from left back, residual melanoma in situ, no residual invasive disease, 1/6 SLN pos for melanoma, final path staging [id number].  We will plan to send BRAF mutation on the surgical pathology specimen.  I will plan to give him adjuvant Keytruda for a year as long as there is no evidence of metastatic disease. His surgical scar appears well healed to move forward with adj immunotherapy.  We have today discussed about the mechanism of action of immunotherapy, adverse effects of immunotherapy including but not limited to fatigue, nausea, hypo-/hyperthyroidism, arthralgias, hepatitis, nephritis and rarely life-threatening adverse effects.    He is willing to proceed with adjuvant Keytruda.  We will also consider imaging for surveillance, PET/CT and MRI brain every 6 months for the first 5 years.  Time spent: 30 min  *Total Encounter Time as defined by the Centers for Medicare and Medicaid Services includes, in addition to the face-to-face time of a patient visit (documented in the note above) non-face-to-face time: obtaining and reviewing outside history, ordering and reviewing medications, tests or procedures, care coordination (communications with other health care professionals or caregivers) and documentation in the medical record.

## 2023-07-21 NOTE — Progress Notes (Signed)
 Pharmacist Chemotherapy Monitoring - Initial Assessment    Anticipated start date: 07/28/23   The following has been reviewed per standard work regarding the patient's treatment regimen: The patient's diagnosis, treatment plan and drug doses, and organ/hematologic function Lab orders and baseline tests specific to treatment regimen  The treatment plan start date, drug sequencing, and pre-medications Prior authorization status  Patient's documented medication list, including drug-drug interaction screen and prescriptions for anti-emetics and supportive care specific to the treatment regimen The drug concentrations, fluid compatibility, administration routes, and timing of the medications to be used The patient's access for treatment and lifetime cumulative dose history, if applicable  The patient's medication allergies and previous infusion related reactions, if applicable   Changes made to treatment plan:  N/A  Follow up needed:  Pending authorization for treatment    Aaron Moore, PharmD, MBA

## 2023-07-28 ENCOUNTER — Inpatient Hospital Stay

## 2023-07-28 VITALS — BP 147/92 | HR 63 | Temp 98.0°F | Resp 16 | Wt 266.5 lb

## 2023-07-28 DIAGNOSIS — C439 Malignant melanoma of skin, unspecified: Secondary | ICD-10-CM

## 2023-07-28 DIAGNOSIS — Z5112 Encounter for antineoplastic immunotherapy: Secondary | ICD-10-CM | POA: Diagnosis not present

## 2023-07-28 DIAGNOSIS — C090 Malignant neoplasm of tonsillar fossa: Secondary | ICD-10-CM

## 2023-07-28 LAB — CBC WITH DIFFERENTIAL (CANCER CENTER ONLY)
Abs Immature Granulocytes: 0.01 10*3/uL (ref 0.00–0.07)
Basophils Absolute: 0 10*3/uL (ref 0.0–0.1)
Basophils Relative: 1 %
Eosinophils Absolute: 0.3 10*3/uL (ref 0.0–0.5)
Eosinophils Relative: 4 %
HCT: 42.6 % (ref 39.0–52.0)
Hemoglobin: 14.3 g/dL (ref 13.0–17.0)
Immature Granulocytes: 0 %
Lymphocytes Relative: 10 %
Lymphs Abs: 0.6 10*3/uL — ABNORMAL LOW (ref 0.7–4.0)
MCH: 28.6 pg (ref 26.0–34.0)
MCHC: 33.6 g/dL (ref 30.0–36.0)
MCV: 85.2 fL (ref 80.0–100.0)
Monocytes Absolute: 0.6 10*3/uL (ref 0.1–1.0)
Monocytes Relative: 10 %
Neutro Abs: 4.6 10*3/uL (ref 1.7–7.7)
Neutrophils Relative %: 75 %
Platelet Count: 187 10*3/uL (ref 150–400)
RBC: 5 MIL/uL (ref 4.22–5.81)
RDW: 14.2 % (ref 11.5–15.5)
WBC Count: 6 10*3/uL (ref 4.0–10.5)
nRBC: 0 % (ref 0.0–0.2)

## 2023-07-28 LAB — CMP (CANCER CENTER ONLY)
ALT: 22 U/L (ref 0–44)
AST: 21 U/L (ref 15–41)
Albumin: 4.4 g/dL (ref 3.5–5.0)
Alkaline Phosphatase: 99 U/L (ref 38–126)
Anion gap: 7 (ref 5–15)
BUN: 23 mg/dL — ABNORMAL HIGH (ref 6–20)
CO2: 26 mmol/L (ref 22–32)
Calcium: 9.2 mg/dL (ref 8.9–10.3)
Chloride: 108 mmol/L (ref 98–111)
Creatinine: 1.36 mg/dL — ABNORMAL HIGH (ref 0.61–1.24)
GFR, Estimated: 60 mL/min — ABNORMAL LOW (ref >60)
Glucose, Bld: 123 mg/dL — ABNORMAL HIGH (ref 70–99)
Potassium: 3.7 mmol/L (ref 3.5–5.1)
Sodium: 141 mmol/L (ref 135–145)
Total Bilirubin: 0.5 mg/dL (ref 0.0–1.2)
Total Protein: 7.2 g/dL (ref 6.5–8.1)

## 2023-07-28 LAB — TSH: TSH: 6.85 u[IU]/mL — ABNORMAL HIGH (ref 0.350–4.500)

## 2023-07-28 MED ORDER — SODIUM CHLORIDE 0.9 % IV SOLN
200.0000 mg | Freq: Once | INTRAVENOUS | Status: AC
  Administered 2023-07-28: 200 mg via INTRAVENOUS
  Filled 2023-07-28: qty 200

## 2023-07-28 MED ORDER — SODIUM CHLORIDE 0.9 % IV SOLN: INTRAVENOUS | Status: DC

## 2023-07-28 MED ORDER — SODIUM CHLORIDE 0.9% FLUSH: 10.0000 mL | Freq: Once | INTRAVENOUS | Status: AC

## 2023-07-28 NOTE — Patient Instructions (Signed)
 [facility] - A DEPT OF [facility]  Discharge Instructions: Thank you for choosing [facility] to provide your oncology and hematology care.   If you have a lab appointment with the Cancer Center, please go directly to the Cancer Center and check in at the registration area.   Wear comfortable clothing and clothing appropriate for easy access to any Portacath or PICC line.   We strive to give you quality time with your provider. You may need to reschedule your appointment if you arrive late (15 or more minutes).  Arriving late affects you and other patients whose appointments are after yours.  Also, if you miss three or more appointments without notifying the office, you may be dismissed from the clinic at the provider's discretion.      For prescription refill requests, have your pharmacy contact our office and allow 72 hours for refills to be completed.    Today you received the following chemotherapy and/or immunotherapy agents Pembrolizumab Rande Lawman)   To help prevent nausea and vomiting after your treatment, we encourage you to take your nausea medication as directed.  BELOW ARE SYMPTOMS THAT SHOULD BE REPORTED IMMEDIATELY: *FEVER GREATER THAN 100.4 F (38 C) OR HIGHER *CHILLS OR SWEATING *NAUSEA AND VOMITING THAT IS NOT CONTROLLED WITH YOUR NAUSEA MEDICATION *UNUSUAL SHORTNESS OF BREATH *UNUSUAL BRUISING OR BLEEDING *URINARY PROBLEMS (pain or burning when urinating, or frequent urination) *BOWEL PROBLEMS (unusual diarrhea, constipation, pain near the anus) TENDERNESS IN MOUTH AND THROAT WITH OR WITHOUT PRESENCE OF ULCERS (sore throat, sores in mouth, or a toothache) UNUSUAL RASH, SWELLING OR PAIN  UNUSUAL VAGINAL DISCHARGE OR ITCHING   Items with * indicate a potential emergency and should be followed up as soon as possible or go to the Emergency Department if any problems should occur.  Please show the CHEMOTHERAPY ALERT CARD or  IMMUNOTHERAPY ALERT CARD at check-in to the Emergency Department and triage nurse.  Should you have questions after your visit or need to cancel or reschedule your appointment, please contact [facility] - A DEPT OF [facility]  Dept: [phone]  and follow the prompts.  Office hours are 8:00 a.m. to 4:30 p.m. Monday - Friday. Please note that voicemails left after 4:00 p.m. may not be returned until the following business day.  We are closed weekends and major holidays. You have access to a nurse at all times for urgent questions. Please call the main number to the clinic Dept: [phone] and follow the prompts.   For any non-urgent questions, you may also contact your provider using MyChart. We now offer e-Visits for anyone 20 and older to request care online for non-urgent symptoms. For details visit [email or web address].   Also download the MyChart app! Go to the app store, search "MyChart", open the app, select [place], and log in with your MyChart username and password.   Pembrolizumab Injection What is this medication? PEMBROLIZUMAB (PEM broe LIZ ue mab) treats some types of cancer. It works by helping your immune system slow or stop the spread of cancer cells. It is a monoclonal antibody. This medicine may be used for other purposes; ask your health care provider or pharmacist if you have questions. COMMON BRAND NAME(S): Keytruda What should I tell my care team before I take this medication? They need to know if you have any of these conditions: Allogeneic stem cell transplant (uses someone else's stem cells) Autoimmune  diseases, such as Crohn disease, ulcerative colitis, lupus History of chest radiation Nervous system problems, such as Guillain-Barre syndrome, myasthenia gravis Organ transplant An unusual or allergic reaction to pembrolizumab, other medications, foods, dyes, or preservatives Pregnant or trying to get  pregnant Breast-feeding How should I use this medication? This medication is injected into a vein. It is given by your care team in a hospital or clinic setting. A special MedGuide will be given to you before each treatment. Be sure to read this information carefully each time. Talk to your care team about the use of this medication in children. While it may be prescribed for children as young as 6 months for selected conditions, precautions do apply. Overdosage: If you think you have taken too much of this medicine contact a poison control center or emergency room at once. NOTE: This medicine is only for you. Do not share this medicine with others. What if I miss a dose? Keep appointments for follow-up doses. It is important not to miss your dose. Call your care team if you are unable to keep an appointment. What may interact with this medication? Interactions have not been studied. This list may not describe all possible interactions. Give your health care provider a list of all the medicines, herbs, non-prescription drugs, or dietary supplements you use. Also tell them if you smoke, drink alcohol, or use illegal drugs. Some items may interact with your medicine. What should I watch for while using this medication? Your condition will be monitored carefully while you are receiving this medication. You may need blood work while taking this medication. This medication may cause serious skin reactions. They can happen weeks to months after starting the medication. Contact your care team right away if you notice fevers or flu-like symptoms with a rash. The rash may be red or purple and then turn into blisters or peeling of the skin. You may also notice a red rash with swelling of the face, lips, or lymph nodes in your neck or under your arms. Tell your care team right away if you have any change in your eyesight. Talk to your care team if you may be pregnant. Serious birth defects can occur if you  take this medication during pregnancy and for 4 months after the last dose. You will need a negative pregnancy test before starting this medication. Contraception is recommended while taking this medication and for 4 months after the last dose. Your care team can help you find the option that works for you. Do not breastfeed while taking this medication and for 4 months after the last dose. What side effects may I notice from receiving this medication? Side effects that you should report to your care team as soon as possible: Allergic reactions--skin rash, itching, hives, swelling of the face, lips, tongue, or throat Dry cough, shortness of breath or trouble breathing Eye pain, redness, irritation, or discharge with blurry or decreased vision Heart muscle inflammation--unusual weakness or fatigue, shortness of breath, chest pain, fast or irregular heartbeat, dizziness, swelling of the ankles, feet, or hands Hormone gland problems--headache, sensitivity to light, unusual weakness or fatigue, dizziness, fast or irregular heartbeat, increased sensitivity to cold or heat, excessive sweating, constipation, hair loss, increased thirst or amount of urine, tremors or shaking, irritability Infusion reactions--chest pain, shortness of breath or trouble breathing, feeling faint or lightheaded Kidney injury (glomerulonephritis)--decrease in the amount of urine, red or dark brown urine, foamy or bubbly urine, swelling of the ankles, hands, or feet Liver  injury--right upper belly pain, loss of appetite, nausea, light-colored stool, dark yellow or brown urine, yellowing skin or eyes, unusual weakness or fatigue Pain, tingling, or numbness in the hands or feet, muscle weakness, change in vision, confusion or trouble speaking, loss of balance or coordination, trouble walking, seizures Rash, fever, and swollen lymph nodes Redness, blistering, peeling, or loosening of the skin, including inside the mouth Sudden or  severe stomach pain, bloody diarrhea, fever, nausea, vomiting Side effects that usually do not require medical attention (report to your care team if they continue or are bothersome): Bone, joint, or muscle pain Diarrhea Fatigue Loss of appetite Nausea Skin rash This list may not describe all possible side effects. Call your doctor for medical advice about side effects. You may report side effects to FDA at 1-800-FDA-1088. Where should I keep my medication? This medication is given in a hospital or clinic. It will not be stored at home. NOTE: This sheet is a summary. It may not cover all possible information. If you have questions about this medicine, talk to your doctor, pharmacist, or health care provider.  2024 Elsevier/Gold Standard (2021-07-31 00:00:00)

## 2023-07-29 ENCOUNTER — Telehealth: Payer: Self-pay | Admitting: *Deleted

## 2023-07-29 LAB — T4: T4, Total: 6.6 ug/dL (ref 4.5–12.0)

## 2023-07-29 NOTE — Telephone Encounter (Signed)
-----   Message from Nurse Nellie Banas A sent at 07/28/2023  9:28 AM EDT ----- Regarding: First time First time Keytruda- has had cisplatin  in the past. Tolerated well. Dr. Arno Bibles patient.

## 2023-07-29 NOTE — Telephone Encounter (Signed)
 Called pt to see how he did with his recent treatment.  Requested he call back to let us  know.

## 2023-08-03 LAB — SURGICAL PATHOLOGY

## 2023-08-05 ENCOUNTER — Other Ambulatory Visit: Payer: Self-pay | Admitting: Hematology and Oncology

## 2023-08-18 NOTE — Progress Notes (Signed)
 [facility] Cancer Follow up:    Aaron Brand, DO [address] [place] [address]   DIAGNOSIS:  Cancer Staging  Cancer of tonsillar fossa (HCC) Staging form: Pharynx - HPV-Mediated Oropharynx, AJCC 8th Edition - Clinical stage from 05/08/2022: Stage II (cT3, cN1, cM0, p16+) - Signed by Colie Dawes, MD on 05/08/2022 Stage prefix: Initial diagnosis   SUMMARY OF ONCOLOGIC HISTORY: Oncology History  Cancer of tonsillar fossa (HCC)  05/08/2022 Initial Diagnosis   Cancer of tonsillar fossa (HCC)   05/08/2022 Cancer Staging   Staging form: Pharynx - HPV-Mediated Oropharynx, AJCC 8th Edition - Clinical stage from 05/08/2022: Stage II (cT3, cN1, cM0, p16+) - Signed by Colie Dawes, MD on 05/08/2022 Stage prefix: Initial diagnosis   06/05/2022 - 07/17/2022 Chemotherapy   Patient is on Treatment Plan : HEAD/NECK Cisplatin  (40) q7d     Melanoma of skin (HCC)  07/18/2023 Initial Diagnosis   Melanoma of skin (HCC)   07/28/2023 -  Chemotherapy   Patient is on Treatment Plan : MELANOMA Pembrolizumab  (200) q21d       CURRENT THERAPY: Concurrent chemoradiation  INTERVAL HISTORY:  Aaron Moore 58 y.o. male returns for follow-up.   Discussed the use of AI scribe software for clinical note transcription with the patient, who gave verbal consent to proceed.  History of Present Illness Aaron Moore is a 58 year old male with a history of melanoma, head and neck cancer, and prostate cancer who presents for follow-up regarding immunotherapy treatment. He is accompanied by his caregiver.  He is currently undergoing immunotherapy treatment and describes feeling 'aggravated' and 'spongy' without a clear explanation of these sensations. He experiences a lack of appetite and persistent dysgeusia, with all foods tasting the same. He occasionally craves sugar and consumes a lot of sweet tea, avoiding carbonation due to discomfort.  About a week after treatment, he becomes more  tired, less energetic, and experiences balance issues without falling. He also experiences more frequent headaches. No significant changes in shortness of breath are noted, but he has difficulty tolerating heat.  No issues with urination and no diarrhea. He missed his ENT appointment and has to reschedule this  Rest of the pertinent 10 point ROS reviewed and neg.    Wt Readings from Last 3 Encounters:  08/19/23 267 lb 4.8 oz (121.2 kg)  07/28/23 266 lb 8 oz (120.9 kg)  07/18/23 237 lb 3.2 oz (107.6 kg)     Patient Active Problem List   Diagnosis Date Noted   Melanoma of skin (HCC) 07/18/2023   Port-A-Cath in place 06/04/2022   Cancer of tonsillar fossa (HCC) 05/08/2022   Prostate cancer (HCC) 02/07/2022   Medial epicondylitis of left elbow 03/08/2015   Obesity, Class II, BMI 35-39.9, with comorbidity 11/28/2010   Abdominal wall mass of epigastric region - old hernia sac/lipoma 11/28/2010   Abdominal pain, RLQ - muscle strain 11/28/2010    has no known allergies.  MEDICAL HISTORY: Past Medical History:  Diagnosis Date   Abdominal pain    Cancer (HCC)    prostate cancer, throat cancer, melanoma to back   Complication of anesthesia    slow to wake up 3 years ago after foot surgery   ED (erectile dysfunction)    Gout    Gout    History of kidney stones    Hypertension    Obesity, Class II, BMI 35-39.9, with comorbidity    Painful orthopaedic hardware (HCC)    left foot  SURGICAL HISTORY: Past Surgical History:  Procedure Laterality Date   amputation of 2 toes on left foot      BREAST SURGERY     CARPAL TUNNEL RELEASE  05/03/2011   Procedure: CARPAL TUNNEL RELEASE;  Surgeon: Kemp Patter, MD;  Location: [facility];  Service: Orthopedics;  Laterality: Left;   FOOT FRACTURE SURGERY Left    x3   HARDWARE REMOVAL Left 06/03/2019   Procedure: HARDWARE REMOVAL;  Surgeon: Donnamarie Gables, MD;  Location: [facility];  Service:  Orthopedics;  Laterality: Left;  PROCEDURE: LEFT FOOT REMOVAL OF HARDWARE, IRRIGATION AND DEBRIDEMENT,PLACEMENT OF CEMENT SPACER, DEBULKIN, SOFT TISSUE MASS LENGTH OF SURGERY: 2 HOURS   HERNIA REPAIR  02/06/2009   Lap supraumb & umb VWH repairs   INCISION AND DRAINAGE OF WOUND Left 06/03/2019   Procedure: IRRIGATION AND DEBRIDEMENT WOUND WITH IMPLANTATION OF ANTIBIOTIC CEMENT SPACER;  Surgeon: Donnamarie Gables, MD;  Location: [facility];  Service: Orthopedics;  Laterality: Left;   IR GASTROSTOMY TUBE MOD SED  05/29/2022   IR GASTROSTOMY TUBE REMOVAL  08/28/2022   IR IMAGING GUIDED PORT INSERTION  05/29/2022   IR REMOVAL TUN ACCESS W/ PORT W/O FL MOD SED  11/04/2022   left rotator cuff surgery      LYMPHADENECTOMY Bilateral 02/07/2022   Procedure: BILATERAL PELVIC LYMPHADENECTOMY;  Surgeon: Florencio Hunting, MD;  Location: [facility];  Service: Urology;  Laterality: Bilateral;   MASS EXCISION Left 06/03/2019   Procedure: DEBULKING LEFT FOOT MASS;  Surgeon: Donnamarie Gables, MD;  Location: [facility];  Service: Orthopedics;  Laterality: Left;   MELANOMA EXCISION N/A 06/19/2023   Procedure: EXCISION, MELANOMA;  Surgeon: Lockie Rima, MD;  Location: [facility];  Service: General;  Laterality: N/A;  WIDE LOCAL EXCISION BACK MELANOMA WITH ADVANCEMENT FLAP CLOSURE WITH SENTINEL NODE BIOPSY   ROBOT ASSISTED LAPAROSCOPIC RADICAL PROSTATECTOMY N/A 02/07/2022   Procedure: XI ROBOTIC ASSISTED LAPAROSCOPIC RADICAL PROSTATECTOMY LEVEL 3;  Surgeon: Florencio Hunting, MD;  Location: [facility];  Service: Urology;  Laterality: N/A;  210 MINUTES NEEDED FOR CASE   SENTINEL NODE BIOPSY Left 06/19/2023   Procedure: BIOPSY, LYMPH NODE, SENTINEL;  Surgeon: Lockie Rima, MD;  Location: [facility];  Service: General;  Laterality: Left;    SOCIAL HISTORY: Social History   Socioeconomic History   Marital status: Married    Spouse name: Not on file   Number of children: Not on file   Years of  education: Not on file   Highest education level: Not on file  Occupational History   Not on file  Tobacco Use   Smoking status: Never   Smokeless tobacco: Never  Vaping Use   Vaping status: Never Used  Substance and Sexual Activity   Alcohol use: No   Drug use: No   Sexual activity: Not on file  Other Topics Concern   Not on file  Social History Narrative   Not on file   Social Drivers of Health   Financial Resource Strain: Not on file  Food Insecurity: No Food Insecurity (02/07/2022)   Hunger Vital Sign    Worried About Running Out of Food in the Last Year: Never true    Ran Out of Food in the Last Year: Never true  Transportation Needs: No Transportation Needs (02/07/2022)   PRAPARE - Administrator, Civil Service (Medical): No    Lack of Transportation (Non-Medical): No  Physical Activity: Not on file  Stress: Not  on file  Social Connections: Unknown (05/01/2022)   [facility] from [facility]   Social Network    Social Network: Not on file  Intimate Partner Violence: Unknown (05/01/2022)   Received from [facility]   HITS    Physically Hurt: Not on file    Insult or Talk Down To: Not on file    Threaten Physical Harm: Not on file    Scream or Curse: Not on file    FAMILY HISTORY: Family History  Problem Relation Age of Onset   Diabetes Mother    Diabetes Father     PHYSICAL EXAMINATION  BP (!) 166/90 Comment: MD notified  Pulse 97   Temp 99.2 F (37.3 C) (Temporal)   Resp 18   Wt 267 lb 4.8 oz (121.2 kg)   SpO2 96%   BMI 35.27 kg/m    Vitals:   08/19/23 0836 08/19/23 0837  BP: (!) 169/101 (!) 166/90  Pulse: 97   Resp: 18   Temp: 99.2 F (37.3 C)   SpO2: 96%     Physical Exam Constitutional:      Appearance: Normal appearance.  Cardiovascular:     Rate and Rhythm: Normal rate and regular rhythm.     Pulses: Normal pulses.     Heart sounds: Normal heart sounds.  Pulmonary:     Effort: Pulmonary  effort is normal.     Breath sounds: Normal breath sounds.  Abdominal:     General: Abdomen is flat.     Palpations: Abdomen is soft.  Musculoskeletal:        General: Normal range of motion.     Cervical back: Normal range of motion and neck supple. No rigidity.  Lymphadenopathy:     Cervical: No cervical adenopathy.  Skin:    General: Skin is warm and dry.  Neurological:     General: No focal deficit present.     Mental Status: He is alert.  Psychiatric:        Mood and Affect: Mood normal.      LABORATORY DATA:  CBC    Component Value Date/Time   WBC 7.1 08/19/2023 0822   WBC 6.0 06/13/2023 0835   RBC 4.98 08/19/2023 0822   HGB 14.1 08/19/2023 0822   HCT 42.9 08/19/2023 0822   PLT 185 08/19/2023 0822   MCV 86.1 08/19/2023 0822   MCH 28.3 08/19/2023 0822   MCHC 32.9 08/19/2023 0822   RDW 14.3 08/19/2023 0822   LYMPHSABS 0.5 (L) 08/19/2023 0822   MONOABS 0.7 08/19/2023 0822   EOSABS 0.3 08/19/2023 0822   BASOSABS 0.0 08/19/2023 0822    CMP     Component Value Date/Time   NA 141 08/19/2023 0822   K 3.6 08/19/2023 0822   CL 107 08/19/2023 0822   CO2 28 08/19/2023 0822   GLUCOSE 100 (H) 08/19/2023 0822   BUN 19 08/19/2023 0822   CREATININE 1.35 (H) 08/19/2023 0822   CALCIUM 9.2 08/19/2023 0822   PROT 6.9 08/19/2023 0822   ALBUMIN 4.2 08/19/2023 0822   AST 18 08/19/2023 0822   ALT 20 08/19/2023 0822   ALKPHOS 101 08/19/2023 0822   BILITOT 0.7 08/19/2023 0822   GFRNONAA >60 08/19/2023 0822   GFRAA >60 10/21/2017 2005     ASSESSMENT & PLAN:  This is a very pleasant 58 year old male patient with new diagnosis of squamous cell carcinoma of the tonsil status post concurrent chemoradiation, history of prostate cancer status post radical prostatectomy now with  a new diagnosis of malignant melanoma of his upper back. He had a chronic mole for the past 20 years but has noticed some change in it in the past 6 months, the mole started growing and hence went to see  his dermatologist, had an incision and a biopsy which showed malignant melanoma, Clark's level 4, all margins involved at least staged as a pT4a now back to medical oncology for additional recommendations.  PET CT neg for metastatic disease. He had surgical excision from left back, residual melanoma in situ, no residual invasive disease, 1/6 SLN pos for melanoma, final path staging [id number].  We will plan to send BRAF mutation on the surgical pathology specimen.  I will plan to give him adjuvant Keytruda  for a year as long as there is no evidence of metastatic disease.   Assessment and Plan Assessment & Plan History of head and neck cancer -completed concurrent chemoRT, no evidence of recurrence based on last imaging - Coordinate ENT visits for surveillance, due for this.  Melanoma s/p surgical excision, on adj keytruda . Undergoing immunotherapy to prevent recurrence. No significant side effects. - Continue immunotherapy as scheduled.  Prostate cancer Previously treated with surgery. - Ensure follow-up with urology for routine checks.  Fatigue and decreased energy Increased fatigue likely related to ongoing cancer treatment and psychological impact of recurrent cancer diagnoses.  Loss of appetite and taste changes likely related to treatment. Persistent loss of appetite and taste changes.  Craving sugar and difficulty with carbonation.  Headaches Increased frequency possibly related to treatment or stress   Time spent: 30 min  *Total Encounter Time as defined by the Centers for Medicare and Medicaid Services includes, in addition to the face-to-face time of a patient visit (documented in the note above) non-face-to-face time: obtaining and reviewing outside history, ordering and reviewing medications, tests or procedures, care coordination (communications with other health care professionals or caregivers) and documentation in the medical record.

## 2023-08-19 ENCOUNTER — Inpatient Hospital Stay: Attending: Hematology and Oncology

## 2023-08-19 ENCOUNTER — Inpatient Hospital Stay

## 2023-08-19 ENCOUNTER — Inpatient Hospital Stay (HOSPITAL_BASED_OUTPATIENT_CLINIC_OR_DEPARTMENT_OTHER): Admitting: Hematology and Oncology

## 2023-08-19 VITALS — BP 166/90 | HR 97 | Temp 99.2°F | Resp 18 | Wt 267.3 lb

## 2023-08-19 DIAGNOSIS — Z5111 Encounter for antineoplastic chemotherapy: Secondary | ICD-10-CM

## 2023-08-19 DIAGNOSIS — C61 Malignant neoplasm of prostate: Secondary | ICD-10-CM | POA: Diagnosis present

## 2023-08-19 DIAGNOSIS — Z5112 Encounter for antineoplastic immunotherapy: Secondary | ICD-10-CM | POA: Diagnosis present

## 2023-08-19 DIAGNOSIS — C439 Malignant melanoma of skin, unspecified: Secondary | ICD-10-CM

## 2023-08-19 DIAGNOSIS — Z923 Personal history of irradiation: Secondary | ICD-10-CM | POA: Diagnosis not present

## 2023-08-19 DIAGNOSIS — Z79899 Other long term (current) drug therapy: Secondary | ICD-10-CM | POA: Insufficient documentation

## 2023-08-19 DIAGNOSIS — Z85818 Personal history of malignant neoplasm of other sites of lip, oral cavity, and pharynx: Secondary | ICD-10-CM | POA: Diagnosis not present

## 2023-08-19 DIAGNOSIS — Z9221 Personal history of antineoplastic chemotherapy: Secondary | ICD-10-CM | POA: Insufficient documentation

## 2023-08-19 LAB — CMP (CANCER CENTER ONLY)
ALT: 20 U/L (ref 0–44)
AST: 18 U/L (ref 15–41)
Albumin: 4.2 g/dL (ref 3.5–5.0)
Alkaline Phosphatase: 101 U/L (ref 38–126)
Anion gap: 6 (ref 5–15)
BUN: 19 mg/dL (ref 6–20)
CO2: 28 mmol/L (ref 22–32)
Calcium: 9.2 mg/dL (ref 8.9–10.3)
Chloride: 107 mmol/L (ref 98–111)
Creatinine: 1.35 mg/dL — ABNORMAL HIGH (ref 0.61–1.24)
GFR, Estimated: >60 mL/min (ref >60)
Glucose, Bld: 100 mg/dL — ABNORMAL HIGH (ref 70–99)
Potassium: 3.6 mmol/L (ref 3.5–5.1)
Sodium: 141 mmol/L (ref 135–145)
Total Bilirubin: 0.7 mg/dL (ref 0.0–1.2)
Total Protein: 6.9 g/dL (ref 6.5–8.1)

## 2023-08-19 LAB — CBC WITH DIFFERENTIAL (CANCER CENTER ONLY)
Abs Immature Granulocytes: 0.02 10*3/uL (ref 0.00–0.07)
Basophils Absolute: 0 10*3/uL (ref 0.0–0.1)
Basophils Relative: 0 %
Eosinophils Absolute: 0.3 10*3/uL (ref 0.0–0.5)
Eosinophils Relative: 4 %
HCT: 42.9 % (ref 39.0–52.0)
Hemoglobin: 14.1 g/dL (ref 13.0–17.0)
Immature Granulocytes: 0 %
Lymphocytes Relative: 6 %
Lymphs Abs: 0.5 10*3/uL — ABNORMAL LOW (ref 0.7–4.0)
MCH: 28.3 pg (ref 26.0–34.0)
MCHC: 32.9 g/dL (ref 30.0–36.0)
MCV: 86.1 fL (ref 80.0–100.0)
Monocytes Absolute: 0.7 10*3/uL (ref 0.1–1.0)
Monocytes Relative: 10 %
Neutro Abs: 5.6 10*3/uL (ref 1.7–7.7)
Neutrophils Relative %: 80 %
Platelet Count: 185 10*3/uL (ref 150–400)
RBC: 4.98 MIL/uL (ref 4.22–5.81)
RDW: 14.3 % (ref 11.5–15.5)
WBC Count: 7.1 10*3/uL (ref 4.0–10.5)
nRBC: 0 % (ref 0.0–0.2)

## 2023-08-19 MED ORDER — SODIUM CHLORIDE 0.9 % IV SOLN: INTRAVENOUS | Status: DC

## 2023-08-19 MED ORDER — SODIUM CHLORIDE 0.9 % IV SOLN
200.0000 mg | Freq: Once | INTRAVENOUS | Status: AC
  Administered 2023-08-19: 200 mg via INTRAVENOUS
  Filled 2023-08-19: qty 200

## 2023-08-19 NOTE — Patient Instructions (Signed)
 [facility] - A DEPT OF [facility]  Discharge Instructions: Thank you for choosing [facility] to provide your oncology and hematology care.   If you have a lab appointment with the Cancer Center, please go directly to the Cancer Center and check in at the registration area.   Wear comfortable clothing and clothing appropriate for easy access to any Portacath or PICC line.   We strive to give you quality time with your provider. You may need to reschedule your appointment if you arrive late (15 or more minutes).  Arriving late affects you and other patients whose appointments are after yours.  Also, if you miss three or more appointments without notifying the office, you may be dismissed from the clinic at the provider's discretion.      For prescription refill requests, have your pharmacy contact our office and allow 72 hours for refills to be completed.    Today you received the following chemotherapy and/or immunotherapy agents Pembrolizumab Rande Lawman)   To help prevent nausea and vomiting after your treatment, we encourage you to take your nausea medication as directed.  BELOW ARE SYMPTOMS THAT SHOULD BE REPORTED IMMEDIATELY: *FEVER GREATER THAN 100.4 F (38 C) OR HIGHER *CHILLS OR SWEATING *NAUSEA AND VOMITING THAT IS NOT CONTROLLED WITH YOUR NAUSEA MEDICATION *UNUSUAL SHORTNESS OF BREATH *UNUSUAL BRUISING OR BLEEDING *URINARY PROBLEMS (pain or burning when urinating, or frequent urination) *BOWEL PROBLEMS (unusual diarrhea, constipation, pain near the anus) TENDERNESS IN MOUTH AND THROAT WITH OR WITHOUT PRESENCE OF ULCERS (sore throat, sores in mouth, or a toothache) UNUSUAL RASH, SWELLING OR PAIN  UNUSUAL VAGINAL DISCHARGE OR ITCHING   Items with * indicate a potential emergency and should be followed up as soon as possible or go to the Emergency Department if any problems should occur.  Please show the CHEMOTHERAPY ALERT CARD or  IMMUNOTHERAPY ALERT CARD at check-in to the Emergency Department and triage nurse.  Should you have questions after your visit or need to cancel or reschedule your appointment, please contact [facility] - A DEPT OF [facility]  Dept: [phone]  and follow the prompts.  Office hours are 8:00 a.m. to 4:30 p.m. Monday - Friday. Please note that voicemails left after 4:00 p.m. may not be returned until the following business day.  We are closed weekends and major holidays. You have access to a nurse at all times for urgent questions. Please call the main number to the clinic Dept: [phone] and follow the prompts.   For any non-urgent questions, you may also contact your provider using MyChart. We now offer e-Visits for anyone 20 and older to request care online for non-urgent symptoms. For details visit [email or web address].   Also download the MyChart app! Go to the app store, search "MyChart", open the app, select [place], and log in with your MyChart username and password.   Pembrolizumab Injection What is this medication? PEMBROLIZUMAB (PEM broe LIZ ue mab) treats some types of cancer. It works by helping your immune system slow or stop the spread of cancer cells. It is a monoclonal antibody. This medicine may be used for other purposes; ask your health care provider or pharmacist if you have questions. COMMON BRAND NAME(S): Keytruda What should I tell my care team before I take this medication? They need to know if you have any of these conditions: Allogeneic stem cell transplant (uses someone else's stem cells) Autoimmune  diseases, such as Crohn disease, ulcerative colitis, lupus History of chest radiation Nervous system problems, such as Guillain-Barre syndrome, myasthenia gravis Organ transplant An unusual or allergic reaction to pembrolizumab, other medications, foods, dyes, or preservatives Pregnant or trying to get  pregnant Breast-feeding How should I use this medication? This medication is injected into a vein. It is given by your care team in a hospital or clinic setting. A special MedGuide will be given to you before each treatment. Be sure to read this information carefully each time. Talk to your care team about the use of this medication in children. While it may be prescribed for children as young as 6 months for selected conditions, precautions do apply. Overdosage: If you think you have taken too much of this medicine contact a poison control center or emergency room at once. NOTE: This medicine is only for you. Do not share this medicine with others. What if I miss a dose? Keep appointments for follow-up doses. It is important not to miss your dose. Call your care team if you are unable to keep an appointment. What may interact with this medication? Interactions have not been studied. This list may not describe all possible interactions. Give your health care provider a list of all the medicines, herbs, non-prescription drugs, or dietary supplements you use. Also tell them if you smoke, drink alcohol, or use illegal drugs. Some items may interact with your medicine. What should I watch for while using this medication? Your condition will be monitored carefully while you are receiving this medication. You may need blood work while taking this medication. This medication may cause serious skin reactions. They can happen weeks to months after starting the medication. Contact your care team right away if you notice fevers or flu-like symptoms with a rash. The rash may be red or purple and then turn into blisters or peeling of the skin. You may also notice a red rash with swelling of the face, lips, or lymph nodes in your neck or under your arms. Tell your care team right away if you have any change in your eyesight. Talk to your care team if you may be pregnant. Serious birth defects can occur if you  take this medication during pregnancy and for 4 months after the last dose. You will need a negative pregnancy test before starting this medication. Contraception is recommended while taking this medication and for 4 months after the last dose. Your care team can help you find the option that works for you. Do not breastfeed while taking this medication and for 4 months after the last dose. What side effects may I notice from receiving this medication? Side effects that you should report to your care team as soon as possible: Allergic reactions--skin rash, itching, hives, swelling of the face, lips, tongue, or throat Dry cough, shortness of breath or trouble breathing Eye pain, redness, irritation, or discharge with blurry or decreased vision Heart muscle inflammation--unusual weakness or fatigue, shortness of breath, chest pain, fast or irregular heartbeat, dizziness, swelling of the ankles, feet, or hands Hormone gland problems--headache, sensitivity to light, unusual weakness or fatigue, dizziness, fast or irregular heartbeat, increased sensitivity to cold or heat, excessive sweating, constipation, hair loss, increased thirst or amount of urine, tremors or shaking, irritability Infusion reactions--chest pain, shortness of breath or trouble breathing, feeling faint or lightheaded Kidney injury (glomerulonephritis)--decrease in the amount of urine, red or dark brown urine, foamy or bubbly urine, swelling of the ankles, hands, or feet Liver  injury--right upper belly pain, loss of appetite, nausea, light-colored stool, dark yellow or brown urine, yellowing skin or eyes, unusual weakness or fatigue Pain, tingling, or numbness in the hands or feet, muscle weakness, change in vision, confusion or trouble speaking, loss of balance or coordination, trouble walking, seizures Rash, fever, and swollen lymph nodes Redness, blistering, peeling, or loosening of the skin, including inside the mouth Sudden or  severe stomach pain, bloody diarrhea, fever, nausea, vomiting Side effects that usually do not require medical attention (report to your care team if they continue or are bothersome): Bone, joint, or muscle pain Diarrhea Fatigue Loss of appetite Nausea Skin rash This list may not describe all possible side effects. Call your doctor for medical advice about side effects. You may report side effects to FDA at 1-800-FDA-1088. Where should I keep my medication? This medication is given in a hospital or clinic. It will not be stored at home. NOTE: This sheet is a summary. It may not cover all possible information. If you have questions about this medicine, talk to your doctor, pharmacist, or health care provider.  2024 Elsevier/Gold Standard (2021-07-31 00:00:00)

## 2023-08-20 ENCOUNTER — Telehealth: Payer: Self-pay

## 2023-08-20 NOTE — Telephone Encounter (Signed)
 I reached out to [facility] per a urgent referral received to be seen by Dr. Alita Irwin. Sulema Endo expressed that he is currently on treatment.

## 2023-09-02 ENCOUNTER — Encounter: Payer: Self-pay | Admitting: Hematology and Oncology

## 2023-09-08 ENCOUNTER — Ambulatory Visit: Payer: 59 | Admitting: Hematology and Oncology

## 2023-09-09 ENCOUNTER — Inpatient Hospital Stay

## 2023-09-09 ENCOUNTER — Inpatient Hospital Stay: Attending: Hematology and Oncology

## 2023-09-09 VITALS — BP 164/88 | Temp 97.9°F | Resp 18 | Wt 267.0 lb

## 2023-09-09 DIAGNOSIS — C439 Malignant melanoma of skin, unspecified: Secondary | ICD-10-CM

## 2023-09-09 DIAGNOSIS — C61 Malignant neoplasm of prostate: Secondary | ICD-10-CM | POA: Insufficient documentation

## 2023-09-09 DIAGNOSIS — Z79899 Other long term (current) drug therapy: Secondary | ICD-10-CM | POA: Diagnosis not present

## 2023-09-09 DIAGNOSIS — Z5112 Encounter for antineoplastic immunotherapy: Secondary | ICD-10-CM | POA: Insufficient documentation

## 2023-09-09 LAB — CBC WITH DIFFERENTIAL (CANCER CENTER ONLY)
Abs Immature Granulocytes: 0.01 10*3/uL (ref 0.00–0.07)
Basophils Absolute: 0 10*3/uL (ref 0.0–0.1)
Basophils Relative: 0 %
Eosinophils Absolute: 0.3 10*3/uL (ref 0.0–0.5)
Eosinophils Relative: 4 %
HCT: 43.2 % (ref 39.0–52.0)
Hemoglobin: 14.8 g/dL (ref 13.0–17.0)
Immature Granulocytes: 0 %
Lymphocytes Relative: 8 %
Lymphs Abs: 0.5 10*3/uL — ABNORMAL LOW (ref 0.7–4.0)
MCH: 29 pg (ref 26.0–34.0)
MCHC: 34.3 g/dL (ref 30.0–36.0)
MCV: 84.7 fL (ref 80.0–100.0)
Monocytes Absolute: 0.6 10*3/uL (ref 0.1–1.0)
Monocytes Relative: 9 %
Neutro Abs: 5.3 10*3/uL (ref 1.7–7.7)
Neutrophils Relative %: 79 %
Platelet Count: 190 10*3/uL (ref 150–400)
RBC: 5.1 MIL/uL (ref 4.22–5.81)
RDW: 14.1 % (ref 11.5–15.5)
WBC Count: 6.7 10*3/uL (ref 4.0–10.5)
nRBC: 0 % (ref 0.0–0.2)

## 2023-09-09 LAB — CMP (CANCER CENTER ONLY)
ALT: 17 U/L (ref 0–44)
AST: 18 U/L (ref 15–41)
Albumin: 4.5 g/dL (ref 3.5–5.0)
Alkaline Phosphatase: 110 U/L (ref 38–126)
Anion gap: 7 (ref 5–15)
BUN: 22 mg/dL — ABNORMAL HIGH (ref 6–20)
CO2: 26 mmol/L (ref 22–32)
Calcium: 9.4 mg/dL (ref 8.9–10.3)
Chloride: 107 mmol/L (ref 98–111)
Creatinine: 1.41 mg/dL — ABNORMAL HIGH (ref 0.61–1.24)
GFR, Estimated: 57 mL/min — ABNORMAL LOW (ref >60)
Glucose, Bld: 106 mg/dL — ABNORMAL HIGH (ref 70–99)
Potassium: 3.9 mmol/L (ref 3.5–5.1)
Sodium: 140 mmol/L (ref 135–145)
Total Bilirubin: 0.8 mg/dL (ref 0.0–1.2)
Total Protein: 7.4 g/dL (ref 6.5–8.1)

## 2023-09-09 LAB — TSH: TSH: 5.17 u[IU]/mL — ABNORMAL HIGH (ref 0.350–4.500)

## 2023-09-09 MED ORDER — SODIUM CHLORIDE 0.9 % IV SOLN: INTRAVENOUS | Status: DC

## 2023-09-09 MED ORDER — SODIUM CHLORIDE 0.9 % IV SOLN
200.0000 mg | Freq: Once | INTRAVENOUS | Status: AC
  Administered 2023-09-09: 200 mg via INTRAVENOUS
  Filled 2023-09-09: qty 200

## 2023-09-10 LAB — T4: T4, Total: 8.1 ug/dL (ref 4.5–12.0)

## 2023-09-23 ENCOUNTER — Other Ambulatory Visit: Payer: Self-pay | Admitting: Hematology and Oncology

## 2023-09-23 DIAGNOSIS — C439 Malignant melanoma of skin, unspecified: Secondary | ICD-10-CM

## 2023-09-29 ENCOUNTER — Telehealth: Payer: Self-pay

## 2023-09-29 NOTE — Telephone Encounter (Signed)
 Pts wife verbally confirmed appts for 7/1

## 2023-09-30 ENCOUNTER — Inpatient Hospital Stay: Attending: Hematology and Oncology

## 2023-09-30 ENCOUNTER — Inpatient Hospital Stay

## 2023-09-30 ENCOUNTER — Inpatient Hospital Stay (HOSPITAL_BASED_OUTPATIENT_CLINIC_OR_DEPARTMENT_OTHER): Admitting: Hematology and Oncology

## 2023-09-30 VITALS — BP 170/88 | HR 91 | Temp 98.6°F | Resp 18 | Wt 265.0 lb

## 2023-09-30 DIAGNOSIS — C090 Malignant neoplasm of tonsillar fossa: Secondary | ICD-10-CM | POA: Diagnosis not present

## 2023-09-30 DIAGNOSIS — Z923 Personal history of irradiation: Secondary | ICD-10-CM | POA: Insufficient documentation

## 2023-09-30 DIAGNOSIS — C61 Malignant neoplasm of prostate: Secondary | ICD-10-CM | POA: Insufficient documentation

## 2023-09-30 DIAGNOSIS — Z79899 Other long term (current) drug therapy: Secondary | ICD-10-CM | POA: Insufficient documentation

## 2023-09-30 DIAGNOSIS — Z5112 Encounter for antineoplastic immunotherapy: Secondary | ICD-10-CM | POA: Insufficient documentation

## 2023-09-30 DIAGNOSIS — C439 Malignant melanoma of skin, unspecified: Secondary | ICD-10-CM | POA: Diagnosis not present

## 2023-09-30 DIAGNOSIS — Z85820 Personal history of malignant melanoma of skin: Secondary | ICD-10-CM | POA: Diagnosis not present

## 2023-09-30 DIAGNOSIS — Z9221 Personal history of antineoplastic chemotherapy: Secondary | ICD-10-CM | POA: Insufficient documentation

## 2023-09-30 DIAGNOSIS — Z9079 Acquired absence of other genital organ(s): Secondary | ICD-10-CM | POA: Insufficient documentation

## 2023-09-30 LAB — CBC WITH DIFFERENTIAL (CANCER CENTER ONLY)
Abs Immature Granulocytes: 0.07 10*3/uL (ref 0.00–0.07)
Basophils Absolute: 0.1 10*3/uL (ref 0.0–0.1)
Basophils Relative: 1 %
Eosinophils Absolute: 0.2 10*3/uL (ref 0.0–0.5)
Eosinophils Relative: 3 %
HCT: 41.3 % (ref 39.0–52.0)
Hemoglobin: 13.8 g/dL (ref 13.0–17.0)
Immature Granulocytes: 1 %
Lymphocytes Relative: 10 %
Lymphs Abs: 0.7 10*3/uL (ref 0.7–4.0)
MCH: 28.5 pg (ref 26.0–34.0)
MCHC: 33.4 g/dL (ref 30.0–36.0)
MCV: 85.2 fL (ref 80.0–100.0)
Monocytes Absolute: 0.8 10*3/uL (ref 0.1–1.0)
Monocytes Relative: 11 %
Neutro Abs: 5.3 10*3/uL (ref 1.7–7.7)
Neutrophils Relative %: 74 %
Platelet Count: 182 10*3/uL (ref 150–400)
RBC: 4.85 MIL/uL (ref 4.22–5.81)
RDW: 14.3 % (ref 11.5–15.5)
WBC Count: 7.1 10*3/uL (ref 4.0–10.5)
nRBC: 0 % (ref 0.0–0.2)

## 2023-09-30 LAB — CMP (CANCER CENTER ONLY)
ALT: 22 U/L (ref 0–44)
AST: 19 U/L (ref 15–41)
Albumin: 4.2 g/dL (ref 3.5–5.0)
Alkaline Phosphatase: 99 U/L (ref 38–126)
Anion gap: 7 (ref 5–15)
BUN: 25 mg/dL — ABNORMAL HIGH (ref 6–20)
CO2: 29 mmol/L (ref 22–32)
Calcium: 9.2 mg/dL (ref 8.9–10.3)
Chloride: 107 mmol/L (ref 98–111)
Creatinine: 1.4 mg/dL — ABNORMAL HIGH (ref 0.61–1.24)
GFR, Estimated: 58 mL/min — ABNORMAL LOW (ref >60)
Glucose, Bld: 135 mg/dL — ABNORMAL HIGH (ref 70–99)
Potassium: 3.6 mmol/L (ref 3.5–5.1)
Sodium: 143 mmol/L (ref 135–145)
Total Bilirubin: 0.5 mg/dL (ref 0.0–1.2)
Total Protein: 6.8 g/dL (ref 6.5–8.1)

## 2023-09-30 MED ORDER — SODIUM CHLORIDE 0.9 % IV SOLN: INTRAVENOUS | Status: DC

## 2023-09-30 MED ORDER — SODIUM CHLORIDE 0.9 % IV SOLN
200.0000 mg | Freq: Once | INTRAVENOUS | Status: AC
  Administered 2023-09-30: 200 mg via INTRAVENOUS
  Filled 2023-09-30: qty 200

## 2023-09-30 NOTE — Patient Instructions (Signed)
 [facility] - A DEPT OF [facility]  Discharge Instructions: Thank you for choosing [facility] to provide your oncology and hematology care.   If you have a lab appointment with the Cancer Center, please go directly to the Cancer Center and check in at the registration area.   Wear comfortable clothing and clothing appropriate for easy access to any Portacath or PICC line.   We strive to give you quality time with your provider. You may need to reschedule your appointment if you arrive late (15 or more minutes).  Arriving late affects you and other patients whose appointments are after yours.  Also, if you miss three or more appointments without notifying the office, you may be dismissed from the clinic at the provider's discretion.      For prescription refill requests, have your pharmacy contact our office and allow 72 hours for refills to be completed.    Today you received the following chemotherapy and/or immunotherapy agents Pembrolizumab Aaron Moore)   To help prevent nausea and vomiting after your treatment, we encourage you to take your nausea medication as directed.  BELOW ARE SYMPTOMS THAT SHOULD BE REPORTED IMMEDIATELY: *FEVER GREATER THAN 100.4 F (38 C) OR HIGHER *CHILLS OR SWEATING *NAUSEA AND VOMITING THAT IS NOT CONTROLLED WITH YOUR NAUSEA MEDICATION *UNUSUAL SHORTNESS OF BREATH *UNUSUAL BRUISING OR BLEEDING *URINARY PROBLEMS (pain or burning when urinating, or frequent urination) *BOWEL PROBLEMS (unusual diarrhea, constipation, pain near the anus) TENDERNESS IN MOUTH AND THROAT WITH OR WITHOUT PRESENCE OF ULCERS (sore throat, sores in mouth, or a toothache) UNUSUAL RASH, SWELLING OR PAIN  UNUSUAL VAGINAL DISCHARGE OR ITCHING   Items with * indicate a potential emergency and should be followed up as soon as possible or go to the Emergency Department if any problems should occur.  Please show the CHEMOTHERAPY ALERT CARD or  IMMUNOTHERAPY ALERT CARD at check-in to the Emergency Department and triage nurse.  Should you have questions after your visit or need to cancel or reschedule your appointment, please contact [facility] - A DEPT OF [facility]  Dept: [phone]  and follow the prompts.  Office hours are 8:00 a.m. to 4:30 p.m. Monday - Friday. Please note that voicemails left after 4:00 p.m. may not be returned until the following business day.  We are closed weekends and major holidays. You have access to a nurse at all times for urgent questions. Please call the main number to the clinic Dept: [phone] and follow the prompts.   For any non-urgent questions, you may also contact your provider using MyChart. We now offer e-Visits for anyone 20 and older to request care online for non-urgent symptoms. For details visit [email or web address].   Also download the MyChart app! Go to the app store, search "MyChart", open the app, select [place], and log in with your MyChart username and password.   Pembrolizumab Injection What is this medication? PEMBROLIZUMAB (PEM broe LIZ ue mab) treats some types of cancer. It works by helping your immune system slow or stop the spread of cancer cells. It is a monoclonal antibody. This medicine may be used for other purposes; ask your health care provider or pharmacist if you have questions. COMMON BRAND NAME(S): Keytruda What should I tell my care team before I take this medication? They need to know if you have any of these conditions: Allogeneic stem cell transplant (uses someone else's stem cells) Autoimmune  diseases, such as Crohn disease, ulcerative colitis, lupus History of chest radiation Nervous system problems, such as Guillain-Barre syndrome, myasthenia gravis Organ transplant An unusual or allergic reaction to pembrolizumab, other medications, foods, dyes, or preservatives Pregnant or trying to get  pregnant Breast-feeding How should I use this medication? This medication is injected into a vein. It is given by your care team in a hospital or clinic setting. A special MedGuide will be given to you before each treatment. Be sure to read this information carefully each time. Talk to your care team about the use of this medication in children. While it may be prescribed for children as young as 6 months for selected conditions, precautions do apply. Overdosage: If you think you have taken too much of this medicine contact a poison control center or emergency room at once. NOTE: This medicine is only for you. Do not share this medicine with others. What if I miss a dose? Keep appointments for follow-up doses. It is important not to miss your dose. Call your care team if you are unable to keep an appointment. What may interact with this medication? Interactions have not been studied. This list may not describe all possible interactions. Give your health care provider a list of all the medicines, herbs, non-prescription drugs, or dietary supplements you use. Also tell them if you smoke, drink alcohol, or use illegal drugs. Some items may interact with your medicine. What should I watch for while using this medication? Your condition will be monitored carefully while you are receiving this medication. You may need blood work while taking this medication. This medication may cause serious skin reactions. They can happen weeks to months after starting the medication. Contact your care team right away if you notice fevers or flu-like symptoms with a rash. The rash may be red or purple and then turn into blisters or peeling of the skin. You may also notice a red rash with swelling of the face, lips, or lymph nodes in your neck or under your arms. Tell your care team right away if you have any change in your eyesight. Talk to your care team if you may be pregnant. Serious birth defects can occur if you  take this medication during pregnancy and for 4 months after the last dose. You will need a negative pregnancy test before starting this medication. Contraception is recommended while taking this medication and for 4 months after the last dose. Your care team can help you find the option that works for you. Do not breastfeed while taking this medication and for 4 months after the last dose. What side effects may I notice from receiving this medication? Side effects that you should report to your care team as soon as possible: Allergic reactions--skin rash, itching, hives, swelling of the face, lips, tongue, or throat Dry cough, shortness of breath or trouble breathing Eye pain, redness, irritation, or discharge with blurry or decreased vision Heart muscle inflammation--unusual weakness or fatigue, shortness of breath, chest pain, fast or irregular heartbeat, dizziness, swelling of the ankles, feet, or hands Hormone gland problems--headache, sensitivity to light, unusual weakness or fatigue, dizziness, fast or irregular heartbeat, increased sensitivity to cold or heat, excessive sweating, constipation, hair loss, increased thirst or amount of urine, tremors or shaking, irritability Infusion reactions--chest pain, shortness of breath or trouble breathing, feeling faint or lightheaded Kidney injury (glomerulonephritis)--decrease in the amount of urine, red or dark brown urine, foamy or bubbly urine, swelling of the ankles, hands, or feet Liver  injury--right upper belly pain, loss of appetite, nausea, light-colored stool, dark yellow or brown urine, yellowing skin or eyes, unusual weakness or fatigue Pain, tingling, or numbness in the hands or feet, muscle weakness, change in vision, confusion or trouble speaking, loss of balance or coordination, trouble walking, seizures Rash, fever, and swollen lymph nodes Redness, blistering, peeling, or loosening of the skin, including inside the mouth Sudden or  severe stomach pain, bloody diarrhea, fever, nausea, vomiting Side effects that usually do not require medical attention (report to your care team if they continue or are bothersome): Bone, joint, or muscle pain Diarrhea Fatigue Loss of appetite Nausea Skin rash This list may not describe all possible side effects. Call your doctor for medical advice about side effects. You may report side effects to FDA at 1-800-FDA-1088. Where should I keep my medication? This medication is given in a hospital or clinic. It will not be stored at home. NOTE: This sheet is a summary. It may not cover all possible information. If you have questions about this medicine, talk to your doctor, pharmacist, or health care provider.  2024 Elsevier/Gold Standard (2021-07-31 00:00:00)

## 2023-09-30 NOTE — Progress Notes (Signed)
 [facility] Cancer Follow up:    Aaron Brunet, DO [address] [place] [address] [place] [address]   DIAGNOSIS:  Cancer Staging  Cancer of tonsillar fossa (HCC) Staging form: Pharynx - HPV-Mediated Oropharynx, AJCC 8th Edition - Clinical stage from 05/08/2022: Stage II (cT3, cN1, cM0, p16+) - Signed by Izell Domino, MD on 05/08/2022 Stage prefix: Initial diagnosis   SUMMARY OF ONCOLOGIC HISTORY: Oncology History  Cancer of tonsillar fossa (HCC)  05/08/2022 Initial Diagnosis   Cancer of tonsillar fossa (HCC)   05/08/2022 Cancer Staging   Staging form: Pharynx - HPV-Mediated Oropharynx, AJCC 8th Edition - Clinical stage from 05/08/2022: Stage II (cT3, cN1, cM0, p16+) - Signed by Izell Domino, MD on 05/08/2022 Stage prefix: Initial diagnosis   06/05/2022 - 07/17/2022 Chemotherapy   Patient is on Treatment Plan : HEAD/NECK Cisplatin  (40) q7d     Melanoma of skin (HCC)  07/18/2023 Initial Diagnosis   Melanoma of skin (HCC)   07/28/2023 -  Chemotherapy   Patient is on Treatment Plan : MELANOMA Pembrolizumab  (200) q21d       CURRENT THERAPY: Concurrent chemoradiation  INTERVAL HISTORY:  Aaron Moore 58 y.o. male returns for follow-up.   Discussed the use of AI scribe software for clinical note transcription with the patient, who gave verbal consent to proceed.  History of Present Illness Aaron Moore is a 58 year old male with a history of melanoma, head and neck cancer, and prostate cancer who presents for follow-up while on adj keytruda .  He is undergoing immunotherapy for melanoma and experiences mild headaches, which he attributes to possible dehydration. No other significant side effects from the treatment are noted. He has baseline shortness of breath and expresses feelings of aggravation due to personal conflicts, contributing to his stress.  Since the prostate treatment, he has persistent urinary incontinence, described as 'dribble', and has not regained full  control. He is concerned about a possible urinary infection due to a foul smell and bubbles in his urine, although there is no burning sensation during urination. His bowel movements are about normal, though not regular, and he denies any current constipation or trouble urinating.  He has a history of high blood pressure, which may contribute to his symptoms. He also has a history of a kidney stone during this period.  Rest of the pertinent 10 point ROS reviewed and neg.  Wt Readings from Last 3 Encounters:  09/30/23 265 lb (120.2 kg)  09/09/23 267 lb (121.1 kg)  08/19/23 267 lb 4.8 oz (121.2 kg)     Patient Active Problem List   Diagnosis Date Noted   Melanoma of skin (HCC) 07/18/2023   Port-A-Cath in place 06/04/2022   Cancer of tonsillar fossa (HCC) 05/08/2022   Prostate cancer (HCC) 02/07/2022   Medial epicondylitis of left elbow 03/08/2015   Obesity, Class II, BMI 35-39.9, with comorbidity 11/28/2010   Abdominal wall mass of epigastric region - old hernia sac/lipoma 11/28/2010   Abdominal pain, RLQ - muscle strain 11/28/2010    has no known allergies.  MEDICAL HISTORY: Past Medical History:  Diagnosis Date   Abdominal pain    Cancer (HCC)    prostate cancer, throat cancer, melanoma to back   Complication of anesthesia    slow to wake up 3 years ago after foot surgery   ED (erectile dysfunction)    Gout    Gout    History of kidney stones    Hypertension    Obesity, Class II, BMI 35-39.9,  with comorbidity    Painful orthopaedic hardware [facility])    left foot    SURGICAL HISTORY: Past Surgical History:  Procedure Laterality Date   amputation of 2 toes on left foot      BREAST SURGERY     CARPAL TUNNEL RELEASE  05/03/2011   Procedure: CARPAL TUNNEL RELEASE;  Surgeon: Arley JONELLE Curia, MD;  Location: [facility];  Service: Orthopedics;  Laterality: Left;   FOOT FRACTURE SURGERY Left    x3   HARDWARE REMOVAL Left 06/03/2019   Procedure: HARDWARE REMOVAL;   Surgeon: Elsa Lonni JONELLE, MD;  Location: [facility];  Service: Orthopedics;  Laterality: Left;  PROCEDURE: LEFT FOOT REMOVAL OF HARDWARE, IRRIGATION AND DEBRIDEMENT,PLACEMENT OF CEMENT SPACER, DEBULKIN, SOFT TISSUE MASS LENGTH OF SURGERY: 2 HOURS   HERNIA REPAIR  02/06/2009   Lap supraumb & umb [facility] repairs   INCISION AND DRAINAGE OF WOUND Left 06/03/2019   Procedure: IRRIGATION AND DEBRIDEMENT WOUND WITH IMPLANTATION OF ANTIBIOTIC CEMENT SPACER;  Surgeon: Elsa Lonni JONELLE, MD;  Location: [facility];  Service: Orthopedics;  Laterality: Left;   IR GASTROSTOMY TUBE MOD SED  05/29/2022   IR GASTROSTOMY TUBE REMOVAL  08/28/2022   IR IMAGING GUIDED PORT INSERTION  05/29/2022   IR REMOVAL TUN ACCESS W/ PORT W/O FL MOD SED  11/04/2022   left rotator cuff surgery      LYMPHADENECTOMY Bilateral 02/07/2022   Procedure: BILATERAL PELVIC LYMPHADENECTOMY;  Surgeon: Renda Glance, MD;  Location: [facility];  Service: Urology;  Laterality: Bilateral;   MASS EXCISION Left 06/03/2019   Procedure: DEBULKING LEFT FOOT MASS;  Surgeon: Elsa Lonni JONELLE, MD;  Location: [facility];  Service: Orthopedics;  Laterality: Left;   MELANOMA EXCISION N/A 06/19/2023   Procedure: EXCISION, MELANOMA;  Surgeon: Aron Shoulders, MD;  Location: [facility];  Service: General;  Laterality: N/A;  WIDE LOCAL EXCISION BACK MELANOMA WITH ADVANCEMENT FLAP CLOSURE WITH SENTINEL NODE BIOPSY   ROBOT ASSISTED LAPAROSCOPIC RADICAL PROSTATECTOMY N/A 02/07/2022   Procedure: XI ROBOTIC ASSISTED LAPAROSCOPIC RADICAL PROSTATECTOMY LEVEL 3;  Surgeon: Renda Glance, MD;  Location: [facility];  Service: Urology;  Laterality: N/A;  210 MINUTES NEEDED FOR CASE   SENTINEL NODE BIOPSY Left 06/19/2023   Procedure: BIOPSY, LYMPH NODE, SENTINEL;  Surgeon: Aron Shoulders, MD;  Location: [facility];  Service: General;  Laterality: Left;    SOCIAL HISTORY: Social History   Socioeconomic History   Marital status: Married     Spouse name: Not on file   Number of children: Not on file   Years of education: Not on file   Highest education level: Not on file  Occupational History   Not on file  Tobacco Use   Smoking status: Never   Smokeless tobacco: Never  Vaping Use   Vaping status: Never Used  Substance and Sexual Activity   Alcohol use: No   Drug use: No   Sexual activity: Not on file  Other Topics Concern   Not on file  Social History Narrative   Not on file   Social Drivers of Health   Financial Resource Strain: Not on file  Food Insecurity: No Food Insecurity (02/07/2022)   Hunger Vital Sign    Worried About Running Out of Food in the Last Year: Never true    Ran Out of Food in the Last Year: Never true  Transportation Needs: No Transportation Needs (02/07/2022)   PRAPARE - Administrator, Civil Service (Medical): No  Lack of Transportation (Non-Medical): No  Physical Activity: Not on file  Stress: Not on file  Social Connections: Unknown (05/01/2022)   [facility]   Social Network    Social Network: Not on file  Intimate Partner Violence: Unknown (05/01/2022)   Received from [facility]   HITS    Physically Hurt: Not on file    Insult or Talk Down To: Not on file    Threaten Physical Harm: Not on file    Scream or Curse: Not on file    FAMILY HISTORY: Family History  Problem Relation Age of Onset   Diabetes Mother    Diabetes Father     PHYSICAL EXAMINATION  BP (!) 170/88 Comment: MD notified  Pulse 91   Temp 98.6 F (37 C) (Temporal)   Resp 18   Wt 265 lb (120.2 kg)   SpO2 97%   BMI 34.96 kg/m    Vitals:   09/30/23 1024 09/30/23 1025  BP: (!) 171/93 (!) 170/88  Pulse: 91   Resp: 18   Temp: 98.6 F (37 C)   SpO2: 97%     Physical Exam Constitutional:      Appearance: Normal appearance.   Cardiovascular:     Rate and Rhythm: Normal rate and regular rhythm.     Pulses: Normal pulses.     Heart sounds: Normal heart sounds.   Pulmonary:     Effort: Pulmonary effort is normal.     Breath sounds: Normal breath sounds.  Abdominal:     General: Abdomen is flat.     Palpations: Abdomen is soft.   Musculoskeletal:        General: Normal range of motion.     Cervical back: Normal range of motion and neck supple. No rigidity.  Lymphadenopathy:     Cervical: No cervical adenopathy.   Skin:    General: Skin is warm and dry.   Neurological:     General: No focal deficit present.     Mental Status: He is alert.   Psychiatric:        Mood and Affect: Mood normal.      LABORATORY DATA:  CBC    Component Value Date/Time   WBC 7.1 09/30/2023 1007   WBC 6.0 06/13/2023 0835   RBC 4.85 09/30/2023 1007   HGB 13.8 09/30/2023 1007   HCT 41.3 09/30/2023 1007   PLT 182 09/30/2023 1007   MCV 85.2 09/30/2023 1007   MCH 28.5 09/30/2023 1007   MCHC 33.4 09/30/2023 1007   RDW 14.3 09/30/2023 1007   LYMPHSABS 0.7 09/30/2023 1007   MONOABS 0.8 09/30/2023 1007   EOSABS 0.2 09/30/2023 1007   BASOSABS 0.1 09/30/2023 1007    CMP     Component Value Date/Time   NA 140 09/09/2023 1000   K 3.9 09/09/2023 1000   CL 107 09/09/2023 1000   CO2 26 09/09/2023 1000   GLUCOSE 106 (H) 09/09/2023 1000   BUN 22 (H) 09/09/2023 1000   CREATININE 1.41 (H) 09/09/2023 1000   CALCIUM 9.4 09/09/2023 1000   PROT 7.4 09/09/2023 1000   ALBUMIN 4.5 09/09/2023 1000   AST 18 09/09/2023 1000   ALT 17 09/09/2023 1000   ALKPHOS 110 09/09/2023 1000   BILITOT 0.8 09/09/2023 1000   GFRNONAA 57 (L) 09/09/2023 1000   GFRAA >60 10/21/2017 2005     ASSESSMENT & PLAN:  This is a very pleasant 58 year old male patient with new diagnosis of squamous cell carcinoma of the tonsil  status post concurrent chemoradiation, history of prostate cancer status post radical prostatectomy now with a new diagnosis of malignant melanoma of his upper back. He had a chronic mole for the past 20 years but has noticed some change in it in the past 6 months,  the mole started growing and hence went to see his dermatologist, had an incision and a biopsy which showed malignant melanoma, Clark's level 4, all margins involved at least staged as a pT4a now back to medical oncology for additional recommendations.  PET CT neg for metastatic disease. He had surgical excision from left back, residual melanoma in situ, no residual invasive disease, 1/6 SLN pos for melanoma, final path staging [id number].  We will plan to send BRAF mutation on the surgical pathology specimen.  I will plan to give him adjuvant Keytruda  for a year as long as there is no evidence of metastatic disease.   Assessment and Plan Assessment & Plan History of head and neck cancer -completed concurrent chemoRT, no evidence of recurrence based on last imaging - Coordinate ENT visits for surveillance, due for this.  Melanoma s/p surgical excision, on adj keytruda . Undergoing immunotherapy to prevent recurrence. No significant side effects. - Continue immunotherapy as scheduled. Headaches likely due to dehydration, unrelated to immunotherapy. Shortness of breath at baseline. - Order PET scan and MRI for melanoma surveillance within the next month.  Prostate cancer Previously treated with surgery. - Ensure follow-up with urology for routine checks.  Urinary incontinence post-prostate treatment Persistent urinary incontinence with dribbling and leakage. Possible urinary infection or proteinuria due to hypertension or medication effects. - Consider urinalysis if symptoms persist or worsen.  Hypertension Hypertension present, possible proteinuria contributing to urinary symptoms.   Time spent: 30 min  *Total Encounter Time as defined by the Centers for Medicare and Medicaid Services includes, in addition to the face-to-face time of a patient visit (documented in the note above) non-face-to-face time: obtaining and reviewing outside history, ordering and reviewing medications, tests or  procedures, care coordination (communications with other health care professionals or caregivers) and documentation in the medical record.

## 2023-10-10 ENCOUNTER — Ambulatory Visit (HOSPITAL_COMMUNITY)
Admission: RE | Admit: 2023-10-10 | Discharge: 2023-10-10 | Disposition: A | Discharge status: Home / Self Care | Source: Ambulatory Visit | Attending: Hematology and Oncology

## 2023-10-10 ENCOUNTER — Encounter (HOSPITAL_COMMUNITY): Payer: Self-pay | Admitting: Radiology

## 2023-10-10 DIAGNOSIS — C439 Malignant melanoma of skin, unspecified: Secondary | ICD-10-CM | POA: Diagnosis present

## 2023-10-10 MED ORDER — GADOBUTROL 1 MMOL/ML IV SOLN
10.0000 mL | Freq: Once | INTRAVENOUS | Status: AC | PRN
  Administered 2023-10-10: 10 mL via INTRAVENOUS

## 2023-10-13 ENCOUNTER — Ambulatory Visit
Admission: RE | Admit: 2023-10-13 | Discharge: 2023-10-13 | Disposition: A | Discharge status: Home / Self Care | Source: Ambulatory Visit | Attending: Hematology and Oncology | Admitting: Hematology and Oncology

## 2023-10-13 DIAGNOSIS — C439 Malignant melanoma of skin, unspecified: Secondary | ICD-10-CM | POA: Insufficient documentation

## 2023-10-13 DIAGNOSIS — I2510 Atherosclerotic heart disease of native coronary artery without angina pectoris: Secondary | ICD-10-CM | POA: Diagnosis not present

## 2023-10-13 DIAGNOSIS — I700 Atherosclerosis of aorta: Secondary | ICD-10-CM | POA: Diagnosis not present

## 2023-10-13 DIAGNOSIS — C4359 Malignant melanoma of other part of trunk: Secondary | ICD-10-CM | POA: Insufficient documentation

## 2023-10-13 LAB — GLUCOSE, CAPILLARY: Glucose-Capillary: 105 mg/dL — ABNORMAL HIGH (ref 70–99)

## 2023-10-13 MED ORDER — FLUDEOXYGLUCOSE F - 18 (FDG) INJECTION
12.7000 | Freq: Once | INTRAVENOUS | Status: AC | PRN
  Administered 2023-10-13: 12.7 via INTRAVENOUS

## 2023-10-21 ENCOUNTER — Inpatient Hospital Stay: Admitting: Hematology and Oncology

## 2023-10-21 ENCOUNTER — Inpatient Hospital Stay

## 2023-10-21 VITALS — BP 168/106 | HR 87 | Temp 98.6°F | Resp 20 | Wt 272.8 lb

## 2023-10-21 DIAGNOSIS — C439 Malignant melanoma of skin, unspecified: Secondary | ICD-10-CM

## 2023-10-21 DIAGNOSIS — Z5112 Encounter for antineoplastic immunotherapy: Secondary | ICD-10-CM | POA: Diagnosis not present

## 2023-10-21 LAB — CBC WITH DIFFERENTIAL (CANCER CENTER ONLY)
Abs Immature Granulocytes: 0.01 K/uL (ref 0.00–0.07)
Basophils Absolute: 0 K/uL (ref 0.0–0.1)
Basophils Relative: 0 %
Eosinophils Absolute: 0.3 K/uL (ref 0.0–0.5)
Eosinophils Relative: 5 %
HCT: 43.5 % (ref 39.0–52.0)
Hemoglobin: 14.6 g/dL (ref 13.0–17.0)
Immature Granulocytes: 0 %
Lymphocytes Relative: 10 %
Lymphs Abs: 0.5 K/uL — ABNORMAL LOW (ref 0.7–4.0)
MCH: 28.7 pg (ref 26.0–34.0)
MCHC: 33.6 g/dL (ref 30.0–36.0)
MCV: 85.5 fL (ref 80.0–100.0)
Monocytes Absolute: 0.4 K/uL (ref 0.1–1.0)
Monocytes Relative: 9 %
Neutro Abs: 3.9 K/uL (ref 1.7–7.7)
Neutrophils Relative %: 76 %
Platelet Count: 188 K/uL (ref 150–400)
RBC: 5.09 MIL/uL (ref 4.22–5.81)
RDW: 14.2 % (ref 11.5–15.5)
WBC Count: 5.1 K/uL (ref 4.0–10.5)
nRBC: 0 % (ref 0.0–0.2)

## 2023-10-21 LAB — CMP (CANCER CENTER ONLY)
ALT: 22 U/L (ref 0–44)
AST: 21 U/L (ref 15–41)
Albumin: 4.3 g/dL (ref 3.5–5.0)
Alkaline Phosphatase: 97 U/L (ref 38–126)
Anion gap: 6 (ref 5–15)
BUN: 21 mg/dL — ABNORMAL HIGH (ref 6–20)
CO2: 29 mmol/L (ref 22–32)
Calcium: 9.4 mg/dL (ref 8.9–10.3)
Chloride: 107 mmol/L (ref 98–111)
Creatinine: 1.4 mg/dL — ABNORMAL HIGH (ref 0.61–1.24)
GFR, Estimated: 58 mL/min — ABNORMAL LOW (ref >60)
Glucose, Bld: 94 mg/dL (ref 70–99)
Potassium: 3.5 mmol/L (ref 3.5–5.1)
Sodium: 142 mmol/L (ref 135–145)
Total Bilirubin: 0.8 mg/dL (ref 0.0–1.2)
Total Protein: 7.3 g/dL (ref 6.5–8.1)

## 2023-10-21 MED ORDER — SODIUM CHLORIDE 0.9 % IV SOLN
200.0000 mg | Freq: Once | INTRAVENOUS | Status: AC
  Administered 2023-10-21: 200 mg via INTRAVENOUS
  Filled 2023-10-21: qty 200

## 2023-10-21 MED ORDER — HEPARIN SOD (PORK) LOCK FLUSH 100 UNIT/ML IV SOLN
500.0000 [IU] | Freq: Once | INTRAVENOUS | Status: DC | PRN
Start: 2023-10-21 — End: 2023-10-21

## 2023-10-21 MED ORDER — SODIUM CHLORIDE 0.9 % IV SOLN: INTRAVENOUS | Status: DC

## 2023-10-21 MED ORDER — SODIUM CHLORIDE 0.9% FLUSH: 10.0000 mL | INTRAVENOUS | Status: DC | PRN

## 2023-10-21 NOTE — Patient Instructions (Signed)

## 2023-11-05 ENCOUNTER — Other Ambulatory Visit: Payer: Self-pay | Admitting: Hematology and Oncology

## 2023-11-05 DIAGNOSIS — C439 Malignant melanoma of skin, unspecified: Secondary | ICD-10-CM

## 2023-11-11 ENCOUNTER — Inpatient Hospital Stay

## 2023-11-11 ENCOUNTER — Inpatient Hospital Stay: Attending: Hematology and Oncology

## 2023-11-11 ENCOUNTER — Inpatient Hospital Stay (HOSPITAL_BASED_OUTPATIENT_CLINIC_OR_DEPARTMENT_OTHER): Admitting: Hematology and Oncology

## 2023-11-11 VITALS — BP 125/72 | HR 70 | Temp 98.9°F | Resp 18 | Wt 270.5 lb

## 2023-11-11 DIAGNOSIS — C439 Malignant melanoma of skin, unspecified: Secondary | ICD-10-CM

## 2023-11-11 DIAGNOSIS — Z85820 Personal history of malignant melanoma of skin: Secondary | ICD-10-CM | POA: Insufficient documentation

## 2023-11-11 DIAGNOSIS — Z923 Personal history of irradiation: Secondary | ICD-10-CM | POA: Diagnosis not present

## 2023-11-11 DIAGNOSIS — Z9079 Acquired absence of other genital organ(s): Secondary | ICD-10-CM | POA: Insufficient documentation

## 2023-11-11 DIAGNOSIS — Z8546 Personal history of malignant neoplasm of prostate: Secondary | ICD-10-CM | POA: Diagnosis not present

## 2023-11-11 DIAGNOSIS — Z5112 Encounter for antineoplastic immunotherapy: Secondary | ICD-10-CM | POA: Diagnosis present

## 2023-11-11 DIAGNOSIS — C090 Malignant neoplasm of tonsillar fossa: Secondary | ICD-10-CM | POA: Insufficient documentation

## 2023-11-11 DIAGNOSIS — Z9221 Personal history of antineoplastic chemotherapy: Secondary | ICD-10-CM | POA: Diagnosis not present

## 2023-11-11 LAB — CMP (CANCER CENTER ONLY)
ALT: 20 U/L (ref 0–44)
AST: 22 U/L (ref 15–41)
Albumin: 4.3 g/dL (ref 3.5–5.0)
Alkaline Phosphatase: 98 U/L (ref 38–126)
Anion gap: 6 (ref 5–15)
BUN: 18 mg/dL (ref 6–20)
CO2: 27 mmol/L (ref 22–32)
Calcium: 9 mg/dL (ref 8.9–10.3)
Chloride: 109 mmol/L (ref 98–111)
Creatinine: 1.43 mg/dL — ABNORMAL HIGH (ref 0.61–1.24)
GFR, Estimated: 56 mL/min — ABNORMAL LOW (ref >60)
Glucose, Bld: 100 mg/dL — ABNORMAL HIGH (ref 70–99)
Potassium: 3.6 mmol/L (ref 3.5–5.1)
Sodium: 142 mmol/L (ref 135–145)
Total Bilirubin: 1.1 mg/dL (ref 0.0–1.2)
Total Protein: 7 g/dL (ref 6.5–8.1)

## 2023-11-11 LAB — TSH: TSH: 3.38 u[IU]/mL (ref 0.350–4.500)

## 2023-11-11 LAB — CBC WITH DIFFERENTIAL (CANCER CENTER ONLY)
Abs Immature Granulocytes: 0.03 K/uL (ref 0.00–0.07)
Basophils Absolute: 0 K/uL (ref 0.0–0.1)
Basophils Relative: 1 %
Eosinophils Absolute: 0.2 K/uL (ref 0.0–0.5)
Eosinophils Relative: 3 %
HCT: 41.3 % (ref 39.0–52.0)
Hemoglobin: 13.8 g/dL (ref 13.0–17.0)
Immature Granulocytes: 0 %
Lymphocytes Relative: 7 %
Lymphs Abs: 0.5 K/uL — ABNORMAL LOW (ref 0.7–4.0)
MCH: 28.3 pg (ref 26.0–34.0)
MCHC: 33.4 g/dL (ref 30.0–36.0)
MCV: 84.8 fL (ref 80.0–100.0)
Monocytes Absolute: 0.8 K/uL (ref 0.1–1.0)
Monocytes Relative: 11 %
Neutro Abs: 5.8 K/uL (ref 1.7–7.7)
Neutrophils Relative %: 78 %
Platelet Count: 192 K/uL (ref 150–400)
RBC: 4.87 MIL/uL (ref 4.22–5.81)
RDW: 14.2 % (ref 11.5–15.5)
WBC Count: 7.4 K/uL (ref 4.0–10.5)
nRBC: 0 % (ref 0.0–0.2)

## 2023-11-11 MED ORDER — SODIUM CHLORIDE 0.9 % IV SOLN
200.0000 mg | Freq: Once | INTRAVENOUS | Status: AC
  Administered 2023-11-11 (×2): 200 mg via INTRAVENOUS
  Filled 2023-11-11: qty 200

## 2023-11-11 MED ORDER — SODIUM CHLORIDE 0.9 % IV SOLN
INTRAVENOUS | Status: DC
Start: 2023-11-11 — End: 2023-11-11

## 2023-11-11 NOTE — Progress Notes (Signed)
 [facility] Cancer Follow up:    Aaron Brunet, DO [address] [place] [address] [place] [address]   DIAGNOSIS:  Cancer Staging  Cancer of tonsillar fossa (HCC) Staging form: Pharynx - HPV-Mediated Oropharynx, AJCC 8th Edition - Clinical stage from 05/08/2022: Stage II (cT3, cN1, cM0, p16+) - Signed by Izell Domino, MD on 05/08/2022 Stage prefix: Initial diagnosis   SUMMARY OF ONCOLOGIC HISTORY: Oncology History  Cancer of tonsillar fossa (HCC)  05/08/2022 Initial Diagnosis   Cancer of tonsillar fossa (HCC)   05/08/2022 Cancer Staging   Staging form: Pharynx - HPV-Mediated Oropharynx, AJCC 8th Edition - Clinical stage from 05/08/2022: Stage II (cT3, cN1, cM0, p16+) - Signed by Izell Domino, MD on 05/08/2022 Stage prefix: Initial diagnosis   06/05/2022 - 07/17/2022 Chemotherapy   Patient is on Treatment Plan : HEAD/NECK Cisplatin  (40) q7d     Melanoma of skin (HCC)  07/18/2023 Initial Diagnosis   Melanoma of skin (HCC)   07/28/2023 -  Chemotherapy   Patient is on Treatment Plan : MELANOMA Pembrolizumab  (200) q21d       CURRENT THERAPY: Concurrent chemoradiation  INTERVAL HISTORY:  Aaron Moore 58 y.o. male returns for follow-up.   Discussed the use of AI scribe software for clinical note transcription with the patient, who gave verbal consent to proceed.  History of Present Illness Aaron Moore is a 58 year old male with a history of melanoma, head and neck cancer, and prostate cancer who presents for follow-up while on adj keytruda .  Aaron Moore is a 58 year old male with head and neck cancer who presents with a sensation of something stuck in his throat and loss of taste.  He experiences a sensation of something being stuck in his throat, which is more pronounced in the mornings. This is accompanied by a loss of taste, with everything tasting bad. He describes difficulty in clearing his throat, stating that it takes more than gargling to alleviate the feeling.  Symptoms vary, with some days being better than others.  He mentions that his balance is worsening, and he has experienced falls, particularly on uneven ground. He attributes this to not having all his toes, which affects his stability. No issues with bowel movements or urination. No new falls beyond what he considers normal.  Rest of the pertinent 10 point ROS reviewed and neg.  Wt Readings from Last 3 Encounters:  11/11/23 270 lb 8 oz (122.7 kg)  10/21/23 272 lb 12.8 oz (123.7 kg)  09/30/23 265 lb (120.2 kg)     Patient Active Problem List   Diagnosis Date Noted   Melanoma of skin (HCC) 07/18/2023   Port-A-Cath in place 06/04/2022   Cancer of tonsillar fossa (HCC) 05/08/2022   Prostate cancer (HCC) 02/07/2022   Medial epicondylitis of left elbow 03/08/2015   Obesity, Class II, BMI 35-39.9, with comorbidity 11/28/2010   Abdominal wall mass of epigastric region - old hernia sac/lipoma 11/28/2010   Abdominal pain, RLQ - muscle strain 11/28/2010    has no known allergies.  MEDICAL HISTORY: Past Medical History:  Diagnosis Date   Abdominal pain    Cancer (HCC)    prostate cancer, throat cancer, melanoma to back   Complication of anesthesia    slow to wake up 3 years ago after foot surgery   ED (erectile dysfunction)    Gout    Gout    History of kidney stones    Hypertension    Obesity, Class II, BMI 35-39.9, with comorbidity  Painful orthopaedic hardware [facility])    left foot    SURGICAL HISTORY: Past Surgical History:  Procedure Laterality Date   amputation of 2 toes on left foot      BREAST SURGERY     CARPAL TUNNEL RELEASE  05/03/2011   Procedure: CARPAL TUNNEL RELEASE;  Surgeon: Arley JONELLE Curia, MD;  Location: [facility];  Service: Orthopedics;  Laterality: Left;   FOOT FRACTURE SURGERY Left    x3   HARDWARE REMOVAL Left 06/03/2019   Procedure: HARDWARE REMOVAL;  Surgeon: Elsa Lonni JONELLE, MD;  Location: [facility];  Service:  Orthopedics;  Laterality: Left;  PROCEDURE: LEFT FOOT REMOVAL OF HARDWARE, IRRIGATION AND DEBRIDEMENT,PLACEMENT OF CEMENT SPACER, DEBULKIN, SOFT TISSUE MASS LENGTH OF SURGERY: 2 HOURS   HERNIA REPAIR  02/06/2009   Lap supraumb & umb VWH repairs   INCISION AND DRAINAGE OF WOUND Left 06/03/2019   Procedure: IRRIGATION AND DEBRIDEMENT WOUND WITH IMPLANTATION OF ANTIBIOTIC CEMENT SPACER;  Surgeon: Elsa Lonni JONELLE, MD;  Location: [facility];  Service: Orthopedics;  Laterality: Left;   IR GASTROSTOMY TUBE MOD SED  05/29/2022   IR GASTROSTOMY TUBE REMOVAL  08/28/2022   IR IMAGING GUIDED PORT INSERTION  05/29/2022   IR REMOVAL TUN ACCESS W/ PORT W/O FL MOD SED  11/04/2022   left rotator cuff surgery      LYMPHADENECTOMY Bilateral 02/07/2022   Procedure: BILATERAL PELVIC LYMPHADENECTOMY;  Surgeon: Renda Glance, MD;  Location: [facility];  Service: Urology;  Laterality: Bilateral;   MASS EXCISION Left 06/03/2019   Procedure: DEBULKING LEFT FOOT MASS;  Surgeon: Elsa Lonni JONELLE, MD;  Location: [facility];  Service: Orthopedics;  Laterality: Left;   MELANOMA EXCISION N/A 06/19/2023   Procedure: EXCISION, MELANOMA;  Surgeon: Aron Shoulders, MD;  Location: [facility];  Service: General;  Laterality: N/A;  WIDE LOCAL EXCISION BACK MELANOMA WITH ADVANCEMENT FLAP CLOSURE WITH SENTINEL NODE BIOPSY   ROBOT ASSISTED LAPAROSCOPIC RADICAL PROSTATECTOMY N/A 02/07/2022   Procedure: XI ROBOTIC ASSISTED LAPAROSCOPIC RADICAL PROSTATECTOMY LEVEL 3;  Surgeon: Renda Glance, MD;  Location: [facility];  Service: Urology;  Laterality: N/A;  210 MINUTES NEEDED FOR CASE   SENTINEL NODE BIOPSY Left 06/19/2023   Procedure: BIOPSY, LYMPH NODE, SENTINEL;  Surgeon: Aron Shoulders, MD;  Location: [facility];  Service: General;  Laterality: Left;    SOCIAL HISTORY: Social History   Socioeconomic History   Marital status: Married    Spouse name: Not on file   Number of children: Not on file   Years of  education: Not on file   Highest education level: Not on file  Occupational History   Not on file  Tobacco Use   Smoking status: Never   Smokeless tobacco: Never  Vaping Use   Vaping status: Never Used  Substance and Sexual Activity   Alcohol use: No   Drug use: No   Sexual activity: Not on file  Other Topics Concern   Not on file  Social History Narrative   Not on file   Social Drivers of Health   Financial Resource Strain: Not on file  Food Insecurity: No Food Insecurity (02/07/2022)   Hunger Vital Sign    Worried About Running Out of Food in the Last Year: Never true    Ran Out of Food in the Last Year: Never true  Transportation Needs: No Transportation Needs (02/07/2022)   PRAPARE - Transportation    Lack of Transportation (Medical): No    Lack of  Transportation (Non-Medical): No  Physical Activity: Not on file  Stress: Not on file  Social Connections: Unknown (05/01/2022)   Received from [facility]   Social Network    Social Network: Not on file  Intimate Partner Violence: Unknown (05/01/2022)   Received from [facility]   HITS    Physically Hurt: Not on file    Insult or Talk Down To: Not on file    Threaten Physical Harm: Not on file    Scream or Curse: Not on file    FAMILY HISTORY: Family History  Problem Relation Age of Onset   Diabetes Mother    Diabetes Father     PHYSICAL EXAMINATION  BP 125/72 (BP Location: Left Arm)   Pulse 70   Temp 98.9 F (37.2 C) (Temporal)   Resp 18   Wt 270 lb 8 oz (122.7 kg)   SpO2 99%   BMI 35.69 kg/m    Vitals:   11/11/23 1031  BP: 125/72  Pulse: 70  Resp: 18  Temp: 98.9 F (37.2 C)  SpO2: 99%    Physical Exam Constitutional:      Appearance: Normal appearance.  Cardiovascular:     Rate and Rhythm: Normal rate and regular rhythm.     Pulses: Normal pulses.     Heart sounds: Normal heart sounds.  Pulmonary:     Effort: Pulmonary effort is normal.     Breath sounds: Normal breath sounds.   Abdominal:     General: Abdomen is flat.     Palpations: Abdomen is soft.  Musculoskeletal:        General: Normal range of motion.     Cervical back: Normal range of motion and neck supple. No rigidity.  Lymphadenopathy:     Cervical: No cervical adenopathy.  Skin:    General: Skin is warm and dry.  Neurological:     General: No focal deficit present.     Mental Status: He is alert.  Psychiatric:        Mood and Affect: Mood normal.      LABORATORY DATA:  CBC    Component Value Date/Time   WBC 7.4 11/11/2023 0941   WBC 6.0 06/13/2023 0835   RBC 4.87 11/11/2023 0941   HGB 13.8 11/11/2023 0941   HCT 41.3 11/11/2023 0941   PLT 192 11/11/2023 0941   MCV 84.8 11/11/2023 0941   MCH 28.3 11/11/2023 0941   MCHC 33.4 11/11/2023 0941   RDW 14.2 11/11/2023 0941   LYMPHSABS 0.5 (L) 11/11/2023 0941   MONOABS 0.8 11/11/2023 0941   EOSABS 0.2 11/11/2023 0941   BASOSABS 0.0 11/11/2023 0941    CMP     Component Value Date/Time   NA 142 11/11/2023 0941   K 3.6 11/11/2023 0941   CL 109 11/11/2023 0941   CO2 27 11/11/2023 0941   GLUCOSE 100 (H) 11/11/2023 0941   BUN 18 11/11/2023 0941   CREATININE 1.43 (H) 11/11/2023 0941   CALCIUM 9.0 11/11/2023 0941   PROT 7.0 11/11/2023 0941   ALBUMIN 4.3 11/11/2023 0941   AST 22 11/11/2023 0941   ALT 20 11/11/2023 0941   ALKPHOS 98 11/11/2023 0941   BILITOT 1.1 11/11/2023 0941   GFRNONAA 56 (L) 11/11/2023 0941   GFRAA >60 10/21/2017 2005     ASSESSMENT & PLAN:  This is a very pleasant 58 year old male patient with new diagnosis of squamous cell carcinoma of the tonsil status post concurrent chemoradiation, history of prostate cancer status post radical prostatectomy now  with a new diagnosis of malignant melanoma of his upper back. He had a chronic mole for the past 20 years but has noticed some change in it in the past 6 months, the mole started growing and hence went to see his dermatologist, had an incision and a biopsy which  showed malignant melanoma, Clark's level 4, all margins involved at least staged as a pT4a now back to medical oncology for additional recommendations.  PET CT neg for metastatic disease. He had surgical excision from left back, residual melanoma in situ, no residual invasive disease, 1/6 SLN pos for melanoma, final path staging PT4aN1.  We will plan to send BRAF mutation on the surgical pathology specimen.  He is on adj Keytruda    Assessment and Plan Assessment & Plan  History of head and neck cancer, status post radiation and immunotherapy No evidence of recurrence on recent imaging. Keytruda  well-tolerated hematologically. Fatigue noted as a side effect. Radiation resulted in neck scar tissue. - Continue Keytruda . - Monitor for Keytruda  side effects, including fatigue.  Fatigue secondary to immunotherapy (Keytruda ) Fatigue is a common side effect of Keytruda , likely multifactorial with contributions from prior radiation.  Dysgeusia and mucus impaction of throat Symptoms likely related to prior radiation and scar tissue.  Scar tissue of neck secondary to radiation Fibrotic and permanent scar tissue from prior radiation. Adaptation may occur over time.  Chronic kidney disease likely secondary to prior cancer treatment Kidney injury from prior treatments. Current function well-managed, no Keytruda  contribution.  Hypertension with possible lacunar infarcts MRI suggests possible lacunar infarcts, likely from uncontrolled hypertension. No melanoma-related brain changes.  Balance impairment Worsening balance possibly related to prior treatments or other conditions.  Monitoring for thyroid  dysfunction due to immunotherapy Thyroid  function monitored due to Keytruda . Previous tests normal, current results pending. - Continue monitoring thyroid  function every three cycles. - Review thyroid  function test results when available.   Time spent: 30 min  *Total Encounter Time as defined by  the Centers for Medicare and Medicaid Services includes, in addition to the face-to-face time of a patient visit (documented in the note above) non-face-to-face time: obtaining and reviewing outside history, ordering and reviewing medications, tests or procedures, care coordination (communications with other health care professionals or caregivers) and documentation in the medical record.

## 2023-11-11 NOTE — Patient Instructions (Signed)

## 2023-11-12 LAB — T4: T4, Total: 7.6 ug/dL (ref 4.5–12.0)

## 2023-12-03 ENCOUNTER — Inpatient Hospital Stay

## 2023-12-03 ENCOUNTER — Inpatient Hospital Stay: Attending: Hematology and Oncology

## 2023-12-03 ENCOUNTER — Inpatient Hospital Stay (HOSPITAL_BASED_OUTPATIENT_CLINIC_OR_DEPARTMENT_OTHER): Admitting: Hematology and Oncology

## 2023-12-03 VITALS — BP 156/94 | HR 69 | Temp 98.5°F | Resp 18

## 2023-12-03 VITALS — BP 178/130 | HR 75 | Temp 98.6°F | Resp 17 | Wt 271.0 lb

## 2023-12-03 DIAGNOSIS — Z79899 Other long term (current) drug therapy: Secondary | ICD-10-CM | POA: Diagnosis not present

## 2023-12-03 DIAGNOSIS — Z9221 Personal history of antineoplastic chemotherapy: Secondary | ICD-10-CM | POA: Diagnosis not present

## 2023-12-03 DIAGNOSIS — Z923 Personal history of irradiation: Secondary | ICD-10-CM | POA: Insufficient documentation

## 2023-12-03 DIAGNOSIS — C439 Malignant melanoma of skin, unspecified: Secondary | ICD-10-CM

## 2023-12-03 DIAGNOSIS — Z9079 Acquired absence of other genital organ(s): Secondary | ICD-10-CM | POA: Diagnosis not present

## 2023-12-03 DIAGNOSIS — Z5112 Encounter for antineoplastic immunotherapy: Secondary | ICD-10-CM | POA: Diagnosis present

## 2023-12-03 DIAGNOSIS — C4359 Malignant melanoma of other part of trunk: Secondary | ICD-10-CM | POA: Insufficient documentation

## 2023-12-03 DIAGNOSIS — C61 Malignant neoplasm of prostate: Secondary | ICD-10-CM | POA: Diagnosis not present

## 2023-12-03 DIAGNOSIS — C090 Malignant neoplasm of tonsillar fossa: Secondary | ICD-10-CM | POA: Insufficient documentation

## 2023-12-03 LAB — CBC WITH DIFFERENTIAL (CANCER CENTER ONLY)
Abs Immature Granulocytes: 0.01 K/uL (ref 0.00–0.07)
Basophils Absolute: 0 K/uL (ref 0.0–0.1)
Basophils Relative: 1 %
Eosinophils Absolute: 0.2 K/uL (ref 0.0–0.5)
Eosinophils Relative: 4 %
HCT: 44.7 % (ref 39.0–52.0)
Hemoglobin: 14.9 g/dL (ref 13.0–17.0)
Immature Granulocytes: 0 %
Lymphocytes Relative: 10 %
Lymphs Abs: 0.6 K/uL — ABNORMAL LOW (ref 0.7–4.0)
MCH: 28.5 pg (ref 26.0–34.0)
MCHC: 33.3 g/dL (ref 30.0–36.0)
MCV: 85.5 fL (ref 80.0–100.0)
Monocytes Absolute: 0.7 K/uL (ref 0.1–1.0)
Monocytes Relative: 10 %
Neutro Abs: 4.8 K/uL (ref 1.7–7.7)
Neutrophils Relative %: 75 %
Platelet Count: 208 K/uL (ref 150–400)
RBC: 5.23 MIL/uL (ref 4.22–5.81)
RDW: 14.6 % (ref 11.5–15.5)
WBC Count: 6.4 K/uL (ref 4.0–10.5)
nRBC: 0 % (ref 0.0–0.2)

## 2023-12-03 LAB — CMP (CANCER CENTER ONLY)
ALT: 25 U/L (ref 0–44)
AST: 25 U/L (ref 15–41)
Albumin: 4.2 g/dL (ref 3.5–5.0)
Alkaline Phosphatase: 95 U/L (ref 38–126)
Anion gap: 8 (ref 5–15)
BUN: 20 mg/dL (ref 6–20)
CO2: 26 mmol/L (ref 22–32)
Calcium: 9.2 mg/dL (ref 8.9–10.3)
Chloride: 108 mmol/L (ref 98–111)
Creatinine: 1.27 mg/dL — ABNORMAL HIGH (ref 0.61–1.24)
GFR, Estimated: >60 mL/min (ref >60)
Glucose, Bld: 99 mg/dL (ref 70–99)
Potassium: 3.5 mmol/L (ref 3.5–5.1)
Sodium: 142 mmol/L (ref 135–145)
Total Bilirubin: 0.6 mg/dL (ref 0.0–1.2)
Total Protein: 7.4 g/dL (ref 6.5–8.1)

## 2023-12-03 MED ORDER — SODIUM CHLORIDE 0.9 % IV SOLN
200.0000 mg | Freq: Once | INTRAVENOUS | Status: AC
  Administered 2023-12-03: 200 mg via INTRAVENOUS
  Filled 2023-12-03: qty 200

## 2023-12-03 MED ORDER — SODIUM CHLORIDE 0.9 % IV SOLN: INTRAVENOUS | Status: DC

## 2023-12-03 NOTE — Patient Instructions (Signed)

## 2023-12-03 NOTE — Progress Notes (Signed)
 [facility] Cancer Follow up:    [name], DO [address] [place] [address] [place] [address]   DIAGNOSIS:  Cancer Staging  Cancer of tonsillar fossa (HCC) Staging form: Pharynx - HPV-Mediated Oropharynx, AJCC 8th Edition - Clinical stage from 05/08/2022: Stage II (cT3, cN1, cM0, p16+) - Signed by Izell Domino, MD on 05/08/2022 Stage prefix: Initial diagnosis   SUMMARY OF ONCOLOGIC HISTORY: Oncology History  Cancer of tonsillar fossa (HCC)  05/08/2022 Initial Diagnosis   Cancer of tonsillar fossa (HCC)   05/08/2022 Cancer Staging   Staging form: Pharynx - HPV-Mediated Oropharynx, AJCC 8th Edition - Clinical stage from 05/08/2022: Stage II (cT3, cN1, cM0, p16+) - Signed by Izell Domino, MD on 05/08/2022 Stage prefix: Initial diagnosis   06/05/2022 - 07/17/2022 Chemotherapy   Patient is on Treatment Plan : HEAD/NECK Cisplatin  (40) q7d     Melanoma of skin (HCC)  07/18/2023 Initial Diagnosis   Melanoma of skin (HCC)   07/28/2023 -  Chemotherapy   Patient is on Treatment Plan : MELANOMA Pembrolizumab  (200) q21d       CURRENT THERAPY: Concurrent chemoradiation  INTERVAL HISTORY:  Aaron Moore 58 y.o. male returns for follow-up.   Discussed the use of AI scribe software for clinical note transcription with the patient, who gave verbal consent to proceed.  History of Present Illness  Aaron Moore is a 58 year old male with melanoma who presents for follow-up on immunotherapy treatment.  He is currently undergoing immunotherapy with Keytruda  for melanoma. He experiences tiredness as a side effect. No changes in appetite, breathing difficulties, new cough, or diarrhea. He has not required consultation with other healthcare providers since the last visit.  He has a history of prostate cancer, for which he underwent prostatectomy. He describes ongoing challenges post-surgery, specifically a sensation of 'everything builds up' since the procedure.  He inquires about  the cost of Keytruda , noting that his insurance covers most of it, but he is concerned about the high price of the medication.  Rest of the pertinent 10 point ROS reviewed and neg.  Wt Readings from Last 3 Encounters:  12/03/23 271 lb (122.9 kg)  11/11/23 270 lb 8 oz (122.7 kg)  10/21/23 272 lb 12.8 oz (123.7 kg)     Patient Active Problem List   Diagnosis Date Noted   Melanoma of skin (HCC) 07/18/2023   Port-A-Cath in place 06/04/2022   Cancer of tonsillar fossa (HCC) 05/08/2022   Prostate cancer (HCC) 02/07/2022   Medial epicondylitis of left elbow 03/08/2015   Obesity, Class II, BMI 35-39.9, with comorbidity 11/28/2010   Abdominal wall mass of epigastric region - old hernia sac/lipoma 11/28/2010   Abdominal pain, RLQ - muscle strain 11/28/2010    has no known allergies.  MEDICAL HISTORY: Past Medical History:  Diagnosis Date   Abdominal pain    Cancer (HCC)    prostate cancer, throat cancer, melanoma to back   Complication of anesthesia    slow to wake up 3 years ago after foot surgery   ED (erectile dysfunction)    Gout    Gout    History of kidney stones    Hypertension    Obesity, Class II, BMI 35-39.9, with comorbidity    Painful orthopaedic hardware (HCC)    left foot    SURGICAL HISTORY: Past Surgical History:  Procedure Laterality Date   amputation of 2 toes on left foot      BREAST SURGERY     CARPAL TUNNEL RELEASE  05/03/2011   Procedure: CARPAL TUNNEL RELEASE;  Surgeon: Arley JONELLE Curia, MD;  Location: [facility];  Service: Orthopedics;  Laterality: Left;   FOOT FRACTURE SURGERY Left    x3   HARDWARE REMOVAL Left 06/03/2019   Procedure: HARDWARE REMOVAL;  Surgeon: Elsa Lonni JONELLE, MD;  Location: [facility];  Service: Orthopedics;  Laterality: Left;  PROCEDURE: LEFT FOOT REMOVAL OF HARDWARE, IRRIGATION AND DEBRIDEMENT,PLACEMENT OF CEMENT SPACER, DEBULKIN, SOFT TISSUE MASS LENGTH OF SURGERY: 2 HOURS   HERNIA REPAIR   02/06/2009   Lap supraumb & umb VWH repairs   INCISION AND DRAINAGE OF WOUND Left 06/03/2019   Procedure: IRRIGATION AND DEBRIDEMENT WOUND WITH IMPLANTATION OF ANTIBIOTIC CEMENT SPACER;  Surgeon: Elsa Lonni JONELLE, MD;  Location: [facility];  Service: Orthopedics;  Laterality: Left;   IR GASTROSTOMY TUBE MOD SED  05/29/2022   IR GASTROSTOMY TUBE REMOVAL  08/28/2022   IR IMAGING GUIDED PORT INSERTION  05/29/2022   IR REMOVAL TUN ACCESS W/ PORT W/O FL MOD SED  11/04/2022   left rotator cuff surgery      LYMPHADENECTOMY Bilateral 02/07/2022   Procedure: BILATERAL PELVIC LYMPHADENECTOMY;  Surgeon: Renda Glance, MD;  Location: [facility];  Service: Urology;  Laterality: Bilateral;   MASS EXCISION Left 06/03/2019   Procedure: DEBULKING LEFT FOOT MASS;  Surgeon: Elsa Lonni JONELLE, MD;  Location: [facility];  Service: Orthopedics;  Laterality: Left;   MELANOMA EXCISION N/A 06/19/2023   Procedure: EXCISION, MELANOMA;  Surgeon: Aron Shoulders, MD;  Location: [facility];  Service: General;  Laterality: N/A;  WIDE LOCAL EXCISION BACK MELANOMA WITH ADVANCEMENT FLAP CLOSURE WITH SENTINEL NODE BIOPSY   ROBOT ASSISTED LAPAROSCOPIC RADICAL PROSTATECTOMY N/A 02/07/2022   Procedure: XI ROBOTIC ASSISTED LAPAROSCOPIC RADICAL PROSTATECTOMY LEVEL 3;  Surgeon: Renda Glance, MD;  Location: [facility];  Service: Urology;  Laterality: N/A;  210 MINUTES NEEDED FOR CASE   SENTINEL NODE BIOPSY Left 06/19/2023   Procedure: BIOPSY, LYMPH NODE, SENTINEL;  Surgeon: Aron Shoulders, MD;  Location: [facility];  Service: General;  Laterality: Left;    SOCIAL HISTORY: Social History   Socioeconomic History   Marital status: Married    Spouse name: Not on file   Number of children: Not on file   Years of education: Not on file   Highest education level: Not on file  Occupational History   Not on file  Tobacco Use   Smoking status: Never   Smokeless tobacco: Never  Vaping Use   Vaping status: Never  Used  Substance and Sexual Activity   Alcohol use: No   Drug use: No   Sexual activity: Not on file  Other Topics Concern   Not on file  Social History Narrative   Not on file   Social Drivers of Health   Financial Resource Strain: Not on file  Food Insecurity: No Food Insecurity (02/07/2022)   Hunger Vital Sign    Worried About Running Out of Food in the Last Year: Never true    Ran Out of Food in the Last Year: Never true  Transportation Needs: No Transportation Needs (02/07/2022)   PRAPARE - Administrator, Civil Service (Medical): No    Lack of Transportation (Non-Medical): No  Physical Activity: Not on file  Stress: Not on file  Social Connections: Unknown (05/01/2022)   Received [facility]   Social Network    Social Network: Not on file  Intimate Partner Violence: Unknown (05/01/2022)  Received from [facility]   HITS    Physically Hurt: Not on file    Insult or Talk Down To: Not on file    Threaten Physical Harm: Not on file    Scream or Curse: Not on file    FAMILY HISTORY: Family History  Problem Relation Age of Onset   Diabetes Mother    Diabetes Father     PHYSICAL EXAMINATION  BP (!) 167/105 (BP Location: Left Arm, Patient Position: Sitting, Cuff Size: Normal)   Pulse 75   Temp 98.6 F (37 C) (Temporal)   Resp 15   Wt 271 lb (122.9 kg)   SpO2 97%   BMI 35.75 kg/m    Vitals:   12/03/23 1334  BP: (!) 167/105  Pulse: 75  Resp: 15  Temp: 98.6 F (37 C)  SpO2: 97%    Physical Exam Constitutional:      Appearance: Normal appearance.  Cardiovascular:     Rate and Rhythm: Normal rate and regular rhythm.     Pulses: Normal pulses.     Heart sounds: Normal heart sounds.  Pulmonary:     Effort: Pulmonary effort is normal.     Breath sounds: Normal breath sounds.  Abdominal:     General: Abdomen is flat.     Palpations: Abdomen is soft.  Musculoskeletal:        General: Normal range of motion.     Cervical back: Normal  range of motion and neck supple. No rigidity.  Lymphadenopathy:     Cervical: No cervical adenopathy.  Skin:    General: Skin is warm and dry.  Neurological:     General: No focal deficit present.     Mental Status: He is alert.  Psychiatric:        Mood and Affect: Mood normal.      LABORATORY DATA:  CBC    Component Value Date/Time   WBC 6.4 12/03/2023 1318   WBC 6.0 06/13/2023 0835   RBC 5.23 12/03/2023 1318   HGB 14.9 12/03/2023 1318   HCT 44.7 12/03/2023 1318   PLT 208 12/03/2023 1318   MCV 85.5 12/03/2023 1318   MCH 28.5 12/03/2023 1318   MCHC 33.3 12/03/2023 1318   RDW 14.6 12/03/2023 1318   LYMPHSABS 0.6 (L) 12/03/2023 1318   MONOABS 0.7 12/03/2023 1318   EOSABS 0.2 12/03/2023 1318   BASOSABS 0.0 12/03/2023 1318    CMP     Component Value Date/Time   NA 142 11/11/2023 0941   K 3.6 11/11/2023 0941   CL 109 11/11/2023 0941   CO2 27 11/11/2023 0941   GLUCOSE 100 (H) 11/11/2023 0941   BUN 18 11/11/2023 0941   CREATININE 1.43 (H) 11/11/2023 0941   CALCIUM 9.0 11/11/2023 0941   PROT 7.0 11/11/2023 0941   ALBUMIN 4.3 11/11/2023 0941   AST 22 11/11/2023 0941   ALT 20 11/11/2023 0941   ALKPHOS 98 11/11/2023 0941   BILITOT 1.1 11/11/2023 0941   GFRNONAA 56 (L) 11/11/2023 0941   GFRAA >60 10/21/2017 2005     ASSESSMENT & PLAN:  This is a very pleasant 58 year old male patient with 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 He had a chronic mole for the past 20 years but has noticed some change in it in the past 6 months, the mole started growing and hence  went to see his dermatologist, had an incision and a biopsy which showed malignant melanoma, Clark's level 4, all margins involved at least staged as a pT4a now back to medical oncology for additional recommendations.  PET CT neg  for metastatic disease. He had surgical excision from left back, residual melanoma in situ, no residual invasive disease, 1/6 SLN pos for melanoma, final path staging PT4aN1.  We will plan to send BRAF mutation on the surgical pathology specimen.  He is on adj Keytruda    Assessment and Plan Assessment & Plan  History of head and neck cancer, status post radiation and immunotherapy No evidence of recurrence on recent imaging. Keytruda  well-tolerated hematologically. Fatigue noted as a side effect. Radiation resulted in neck scar tissue. - Continue Keytruda . - Monitor for Keytruda  side effects, including fatigue.  Malignant melanoma of skin of trunk on adj keytruda  Undergoing Keytruda  immunotherapy with fatigue as a side effect. Treatment is more tolerable than previous chemotherapy. Emphasized the curability of melanoma and the importance of continuing treatment. - Continue Keytruda  immunotherapy. - Encouraged positive attitude and activity.   Time spent: 20 min  *Total Encounter Time as defined by the Centers for Medicare and Medicaid Services includes, in addition to the face-to-face time of a patient visit (documented in the note above) non-face-to-face time: obtaining and reviewing outside history, ordering and reviewing medications, tests or procedures, care coordination (communications with other health care professionals or caregivers) and documentation in the medical record.

## 2023-12-23 ENCOUNTER — Inpatient Hospital Stay

## 2023-12-23 ENCOUNTER — Inpatient Hospital Stay (HOSPITAL_BASED_OUTPATIENT_CLINIC_OR_DEPARTMENT_OTHER): Admitting: Hematology and Oncology

## 2023-12-23 VITALS — BP 153/80 | HR 81 | Temp 98.4°F | Resp 18 | Ht 73.0 in | Wt 276.4 lb

## 2023-12-23 DIAGNOSIS — Z8546 Personal history of malignant neoplasm of prostate: Secondary | ICD-10-CM | POA: Diagnosis not present

## 2023-12-23 DIAGNOSIS — Z5111 Encounter for antineoplastic chemotherapy: Secondary | ICD-10-CM

## 2023-12-23 DIAGNOSIS — C439 Malignant melanoma of skin, unspecified: Secondary | ICD-10-CM

## 2023-12-23 DIAGNOSIS — Z5112 Encounter for antineoplastic immunotherapy: Secondary | ICD-10-CM | POA: Diagnosis not present

## 2023-12-23 LAB — CMP (CANCER CENTER ONLY)
ALT: 26 U/L (ref 0–44)
AST: 23 U/L (ref 15–41)
Albumin: 4.4 g/dL (ref 3.5–5.0)
Alkaline Phosphatase: 98 U/L (ref 38–126)
Anion gap: 6 (ref 5–15)
BUN: 24 mg/dL — ABNORMAL HIGH (ref 6–20)
CO2: 28 mmol/L (ref 22–32)
Calcium: 9.2 mg/dL (ref 8.9–10.3)
Chloride: 108 mmol/L (ref 98–111)
Creatinine: 1.38 mg/dL — ABNORMAL HIGH (ref 0.61–1.24)
GFR, Estimated: 59 mL/min — ABNORMAL LOW (ref >60)
Glucose, Bld: 99 mg/dL (ref 70–99)
Potassium: 3.8 mmol/L (ref 3.5–5.1)
Sodium: 142 mmol/L (ref 135–145)
Total Bilirubin: 0.5 mg/dL (ref 0.0–1.2)
Total Protein: 7.3 g/dL (ref 6.5–8.1)

## 2023-12-23 LAB — CBC WITH DIFFERENTIAL (CANCER CENTER ONLY)
Abs Immature Granulocytes: 0.02 K/uL (ref 0.00–0.07)
Basophils Absolute: 0 K/uL (ref 0.0–0.1)
Basophils Relative: 1 %
Eosinophils Absolute: 0.3 K/uL (ref 0.0–0.5)
Eosinophils Relative: 4 %
HCT: 45.1 % (ref 39.0–52.0)
Hemoglobin: 15.1 g/dL (ref 13.0–17.0)
Immature Granulocytes: 0 %
Lymphocytes Relative: 9 %
Lymphs Abs: 0.6 K/uL — ABNORMAL LOW (ref 0.7–4.0)
MCH: 28.9 pg (ref 26.0–34.0)
MCHC: 33.5 g/dL (ref 30.0–36.0)
MCV: 86.2 fL (ref 80.0–100.0)
Monocytes Absolute: 0.7 K/uL (ref 0.1–1.0)
Monocytes Relative: 10 %
Neutro Abs: 5.5 K/uL (ref 1.7–7.7)
Neutrophils Relative %: 76 %
Platelet Count: 213 K/uL (ref 150–400)
RBC: 5.23 MIL/uL (ref 4.22–5.81)
RDW: 14.6 % (ref 11.5–15.5)
WBC Count: 7.1 K/uL (ref 4.0–10.5)
nRBC: 0 % (ref 0.0–0.2)

## 2023-12-23 MED ORDER — SODIUM CHLORIDE 0.9 % IV SOLN: INTRAVENOUS | Status: DC

## 2023-12-23 MED ORDER — SODIUM CHLORIDE 0.9 % IV SOLN
200.0000 mg | Freq: Once | INTRAVENOUS | Status: AC
  Administered 2023-12-23: 200 mg via INTRAVENOUS
  Filled 2023-12-23: qty 200

## 2023-12-23 NOTE — Progress Notes (Signed)
 [facility] Cancer Follow up:    [name], DO [address] [place] [address] [place] [address]   DIAGNOSIS:  Cancer Staging  Cancer of tonsillar fossa (HCC) Staging form: Pharynx - HPV-Mediated Oropharynx, AJCC 8th Edition - Clinical stage from 05/08/2022: Stage II (cT3, cN1, cM0, p16+) - Signed by Izell Domino, MD on 05/08/2022 Stage prefix: Initial diagnosis   SUMMARY OF ONCOLOGIC HISTORY: Oncology History  Cancer of tonsillar fossa (HCC)  05/08/2022 Initial Diagnosis   Cancer of tonsillar fossa (HCC)   05/08/2022 Cancer Staging   Staging form: Pharynx - HPV-Mediated Oropharynx, AJCC 8th Edition - Clinical stage from 05/08/2022: Stage II (cT3, cN1, cM0, p16+) - Signed by Izell Domino, MD on 05/08/2022 Stage prefix: Initial diagnosis   06/05/2022 - 07/17/2022 Chemotherapy   Patient is on Treatment Plan : HEAD/NECK Cisplatin  (40) q7d     Melanoma of skin (HCC)  07/18/2023 Initial Diagnosis   Melanoma of skin (HCC)   07/28/2023 -  Chemotherapy   Patient is on Treatment Plan : MELANOMA Pembrolizumab  (200) q21d       CURRENT THERAPY: Concurrent chemoradiation  INTERVAL HISTORY:  Aaron Moore 58 y.o. male returns for follow-up.   Discussed the use of AI scribe software for clinical note transcription with the patient, who gave verbal consent to proceed.  History of Present Illness Aaron Moore is a 58 year old male with prostate cancer who presents for follow-up in hematology and medical oncology.  He is currently undergoing adj treatment for melanoma, which began in April and are expected to continue until April of the following year. He reports that fatigue is his primary side effect from treatment. He reports that his last scans were done in July.  He experiences scaliness and itching behind his ears and on his forehead, with flaking of the skin. He has been using Head & Shoulders shampoo but continues to have symptoms.  He notes that his urine is 'real  milk' and has an odor. He has not scheduled a follow-up with urology.  He takes two blood pressure medications and medication for gout.  He experiences swelling in his legs, particularly in the leg from which his toes were removed. He wears compression socks most days to manage the swelling.  No changes in appetite, breathing difficulties, new cough, shortness of breath, diarrhea, or mouth sores.  Rest of the pertinent 10 point ROS reviewed and neg.  Wt Readings from Last 3 Encounters:  12/23/23 276 lb 6.4 oz (125.4 kg)  12/03/23 271 lb (122.9 kg)  11/11/23 270 lb 8 oz (122.7 kg)     Patient Active Problem List   Diagnosis Date Noted   Melanoma of skin (HCC) 07/18/2023   Port-A-Cath in place 06/04/2022   Cancer of tonsillar fossa (HCC) 05/08/2022   Prostate cancer (HCC) 02/07/2022   Medial epicondylitis of left elbow 03/08/2015   Obesity, Class II, BMI 35-39.9, with comorbidity 11/28/2010   Abdominal wall mass of epigastric region - old hernia sac/lipoma 11/28/2010   Abdominal pain, RLQ - muscle strain 11/28/2010    has no known allergies.  MEDICAL HISTORY: Past Medical History:  Diagnosis Date   Abdominal pain    Cancer (HCC)    prostate cancer, throat cancer, melanoma to back   Complication of anesthesia    slow to wake up 3 years ago after foot surgery   ED (erectile dysfunction)    Gout    Gout    History of kidney stones  Hypertension    Obesity, Class II, BMI 35-39.9, with comorbidity    Painful orthopaedic hardware    left foot    SURGICAL HISTORY: Past Surgical History:  Procedure Laterality Date   amputation of 2 toes on left foot      BREAST SURGERY     CARPAL TUNNEL RELEASE  05/03/2011   Procedure: CARPAL TUNNEL RELEASE;  Surgeon: Arley JONELLE Curia, MD;  Location: [facility];  Service: Orthopedics;  Laterality: Left;   FOOT FRACTURE SURGERY Left    x3   HARDWARE REMOVAL Left 06/03/2019   Procedure: HARDWARE REMOVAL;  Surgeon: Elsa Lonni JONELLE, MD;  Location: [facility];  Service: Orthopedics;  Laterality: Left;  PROCEDURE: LEFT FOOT REMOVAL OF HARDWARE, IRRIGATION AND DEBRIDEMENT,PLACEMENT OF CEMENT SPACER, DEBULKIN, SOFT TISSUE MASS LENGTH OF SURGERY: 2 HOURS   HERNIA REPAIR  02/06/2009   Lap supraumb & umb VWH repairs   INCISION AND DRAINAGE OF WOUND Left 06/03/2019   Procedure: IRRIGATION AND DEBRIDEMENT WOUND WITH IMPLANTATION OF ANTIBIOTIC CEMENT SPACER;  Surgeon: Elsa Lonni JONELLE, MD;  Location: [facility];  Service: Orthopedics;  Laterality: Left;   IR GASTROSTOMY TUBE MOD SED  05/29/2022   IR GASTROSTOMY TUBE REMOVAL  08/28/2022   IR IMAGING GUIDED PORT INSERTION  05/29/2022   IR REMOVAL TUN ACCESS W/ PORT W/O FL MOD SED  11/04/2022   left rotator cuff surgery      LYMPHADENECTOMY Bilateral 02/07/2022   Procedure: BILATERAL PELVIC LYMPHADENECTOMY;  Surgeon: Renda Glance, MD;  Location: [facility];  Service: Urology;  Laterality: Bilateral;   MASS EXCISION Left 06/03/2019   Procedure: DEBULKING LEFT FOOT MASS;  Surgeon: Elsa Lonni JONELLE, MD;  Location:  SURGERY CENTER;  Service: Orthopedics;  Laterality: Left;   MELANOMA EXCISION N/A 06/19/2023   Procedure: EXCISION, MELANOMA;  Surgeon: Aron Shoulders, MD;  Location: [facility];  Service: General;  Laterality: N/A;  WIDE LOCAL EXCISION BACK MELANOMA WITH ADVANCEMENT FLAP CLOSURE WITH SENTINEL NODE BIOPSY   ROBOT ASSISTED LAPAROSCOPIC RADICAL PROSTATECTOMY N/A 02/07/2022   Procedure: XI ROBOTIC ASSISTED LAPAROSCOPIC RADICAL PROSTATECTOMY LEVEL 3;  Surgeon: Renda Glance, MD;  Location: [facility];  Service: Urology;  Laterality: N/A;  210 MINUTES NEEDED FOR CASE   SENTINEL NODE BIOPSY Left 06/19/2023   Procedure: BIOPSY, LYMPH NODE, SENTINEL;  Surgeon: Aron Shoulders, MD;  Location: [facility];  Service: General;  Laterality: Left;    SOCIAL HISTORY: Social History   Socioeconomic History   Marital status: Married    Spouse  name: Not on file   Number of children: Not on file   Years of education: Not on file   Highest education level: Not on file  Occupational History   Not on file  Tobacco Use   Smoking status: Never   Smokeless tobacco: Never  Vaping Use   Vaping status: Never Used  Substance and Sexual Activity   Alcohol use: No   Drug use: No   Sexual activity: Not on file  Other Topics Concern   Not on file  Social History Narrative   Not on file   Social Drivers of Health   Financial Resource Strain: Not on file  Food Insecurity: No Food Insecurity (02/07/2022)   Hunger Vital Sign    Worried About Running Out of Food in the Last Year: Never true    Ran Out of Food in the Last Year: Never true  Transportation Needs: No Transportation Needs (02/07/2022)   PRAPARE - Transportation  Lack of Transportation (Medical): No    Lack of Transportation (Non-Medical): No  Physical Activity: Not on file  Stress: Not on file  Social Connections: Unknown (05/01/2022)   [facility]   Social Network    Social Network: Not on file  Intimate Partner Violence: Unknown (05/01/2022)   Received from [facility]   HITS    Physically Hurt: Not on file    Insult or Talk Down To: Not on file    Threaten Physical Harm: Not on file    Scream or Curse: Not on file    FAMILY HISTORY: Family History  Problem Relation Age of Onset   Diabetes Mother    Diabetes Father     PHYSICAL EXAMINATION  BP (!) 153/80 (BP Location: Left Arm, Patient Position: Sitting)   Pulse 81   Temp 98.4 F (36.9 C) (Temporal)   Resp 18   Ht 6' 1 (1.854 m)   Wt 276 lb 6.4 oz (125.4 kg)   SpO2 97%   BMI 36.47 kg/m    Vitals:   12/23/23 0843  BP: (!) 153/80  Pulse: 81  Resp: 18  Temp: 98.4 F (36.9 C)  SpO2: 97%     Physical Exam Constitutional:      Appearance: Normal appearance.  Cardiovascular:     Rate and Rhythm: Normal rate and regular rhythm.     Pulses: Normal pulses.     Heart  sounds: Normal heart sounds.  Pulmonary:     Effort: Pulmonary effort is normal.     Breath sounds: Normal breath sounds.  Abdominal:     General: Abdomen is flat.     Palpations: Abdomen is soft.  Musculoskeletal:        General: Normal range of motion.     Cervical back: Normal range of motion and neck supple. No rigidity.  Lymphadenopathy:     Cervical: No cervical adenopathy.  Skin:    General: Skin is warm and dry.  Neurological:     General: No focal deficit present.     Mental Status: He is alert.  Psychiatric:        Mood and Affect: Mood normal.      LABORATORY DATA:  CBC    Component Value Date/Time   WBC 7.1 12/23/2023 0836   WBC 6.0 06/13/2023 0835   RBC 5.23 12/23/2023 0836   HGB 15.1 12/23/2023 0836   HCT 45.1 12/23/2023 0836   PLT 213 12/23/2023 0836   MCV 86.2 12/23/2023 0836   MCH 28.9 12/23/2023 0836   MCHC 33.5 12/23/2023 0836   RDW 14.6 12/23/2023 0836   LYMPHSABS 0.6 (L) 12/23/2023 0836   MONOABS 0.7 12/23/2023 0836   EOSABS 0.3 12/23/2023 0836   BASOSABS 0.0 12/23/2023 0836    CMP     Component Value Date/Time   NA 142 12/03/2023 1318   K 3.5 12/03/2023 1318   CL 108 12/03/2023 1318   CO2 26 12/03/2023 1318   GLUCOSE 99 12/03/2023 1318   BUN 20 12/03/2023 1318   CREATININE 1.27 (H) 12/03/2023 1318   CALCIUM 9.2 12/03/2023 1318   PROT 7.4 12/03/2023 1318   ALBUMIN 4.2 12/03/2023 1318   AST 25 12/03/2023 1318   ALT 25 12/03/2023 1318   ALKPHOS 95 12/03/2023 1318   BILITOT 0.6 12/03/2023 1318   GFRNONAA >60 12/03/2023 1318   GFRAA >60 10/21/2017 2005     ASSESSMENT & PLAN:  This is a very pleasant 58 year old male patient with new  diagnosis of 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 He had a chronic mole for the past 20 years but has noticed some change in it in the  past 6 months, the mole started growing and hence went to see his dermatologist, had an incision and a biopsy which showed malignant melanoma, Clark's level 4, all margins involved at least staged as a pT4a now back to medical oncology for additional recommendations.  PET CT neg for metastatic disease. He had surgical excision from left back, residual melanoma in situ, no residual invasive disease, 1/6 SLN pos for melanoma, final path staging PT4aN1.  We will plan to send BRAF mutation on the surgical pathology specimen.  He is on adj Keytruda    Assessment and Plan Assessment & Plan  History of head and neck cancer, status post radiation and immunotherapy No evidence of recurrence on recent imaging. Keytruda  well-tolerated hematologically. Fatigue noted as a side effect. Radiation resulted in neck scar tissue. - Continue Keytruda . - Monitor for Keytruda  side effects, including fatigue.  Malignant melanoma of skin of trunk on adj keytruda  Undergoing Keytruda  immunotherapy with fatigue as a side effect. Treatment is more tolerable than previous chemotherapy. Emphasized the curability of melanoma and the importance of continuing treatment. - Continue Keytruda  immunotherapy. - Prostate cancer, status post treatment, under active surveillance Prostate cancer under active surveillance post-treatment. Persistent urinary symptoms possibly post-treatment effects. - Order PSA test for next visit. - Encourage follow-up with urology.  Urinary incontinence following prostate cancer treatment Urinary incontinence common post-treatment with milky urine and odor.  Seborrheic dermatitis of scalp and periauricular area Seborrheic dermatitis with scaliness and flaking. Head & Shoulders ineffective. - Recommend Nizoral shampoo for scalp and periauricular area.   Time spent: 20 min  *Total Encounter Time as defined by the Centers for Medicare and Medicaid Services includes, in addition to the  face-to-face time of a patient visit (documented in the note above) non-face-to-face time: obtaining and reviewing outside history, ordering and reviewing medications, tests or procedures, care coordination (communications with other health care professionals or caregivers) and documentation in the medical record.

## 2023-12-24 ENCOUNTER — Other Ambulatory Visit: Payer: Self-pay

## 2024-01-02 NOTE — Progress Notes (Incomplete)
 Radiation Oncology         [phone]) [phone] ________________________________  Name: Aaron Moore MRN: [id number]  Date: 01/06/2024  DOB: March 10, 1966  Follow-Up Visit Note  CC: [name], DO  Pietro Norleen Barter, *  Diagnosis and Prior Radiotherapy:     No diagnosis found.   Cancer Staging  Cancer of tonsillar fossa (HCC) Staging form: Pharynx - HPV-Mediated Oropharynx, AJCC 8th Edition - Clinical stage from 05/08/2022: Stage II (cT3, cN1, cM0, p16+) - Signed by Izell Domino, MD on 05/08/2022 Stage prefix: Initial diagnosis    CHIEF COMPLAINT:  Here for follow-up and surveillance of tonsill cancer.   ==========DELIVERED PLANS==========  First Treatment Date: 2022-06-03 - Last Treatment Date: 2022-07-24   Plan Name: HN_R_Tonsil Site: Tonsil, Right Technique: IMRT Mode: Photon Dose Per Fraction: 2 Gy Prescribed Dose (Delivered / Prescribed): 70 Gy / 70 Gy Prescribed Fxs (Delivered / Prescribed): 35 / 35   Narrative:  The patient returns today for routine follow-up.  He completed treatment on 07/24/22. ***  Since he was last seen by us  he was found to have melanoma on his pack. This has since been excised and he is currently undergoing Keytruda  under the care of Dr. Loretha.    ***   ALLERGIES:  has no known allergies.  Meds: Current Outpatient Medications  Medication Sig Dispense Refill   allopurinol (ZYLOPRIM) 100 MG tablet Take 100 mg by mouth daily.     amLODipine -valsartan  (EXFORGE ) 10-320 MG tablet Take 1 tablet by mouth daily.     metoprolol succinate (TOPROL-XL) 50 MG 24 hr tablet Take 50 mg by mouth daily.     No current facility-administered medications for this visit.   Facility-Administered Medications Ordered in Other Visits  Medication Dose Route Frequency Provider Last Rate Last Admin   sodium chloride  flush (NS) 0.9 % injection 10 mL  10 mL Intracatheter Once Iruku, Praveena, MD        Physical Findings: *** The patient is in no acute distress.  Patient is alert and oriented. Wt Readings from Last 3 Encounters:  12/23/23 276 lb 6.4 oz (125.4 kg)  12/03/23 271 lb (122.9 kg)  11/11/23 270 lb 8 oz (122.7 kg)    vitals were not taken for this visit. .   General: Alert and oriented, in no acute distress HEENT: Head is normocephalic. Extraocular movements are intact. Oropharynx is notable for no concerning lesions or masses.  Neck: Neck is notable for no cervical, submandibular, or supraclavicular lymphadenopathy.  Skin: well healed over neck Extremities: No cyanosis or edema. Lymphatics: see Neck Exam Psychiatric: Judgment and insight are intact. Affect is appropriate.   Lab Findings: Lab Results  Component Value Date   WBC 7.1 12/23/2023   HGB 15.1 12/23/2023   HCT 45.1 12/23/2023   MCV 86.2 12/23/2023   PLT 213 12/23/2023    Lab Results  Component Value Date   TSH 3.380 11/11/2023    Radiographic Findings: No results found.  Impression/Plan:    1) Head and Neck Cancer Status: I personally reviewed his PET scan images with him and his wife.  He has had a complete response to treatment.  Our patient navigator will contact radiology and interventional radiology about the incidental finding of the radiopaque foreign body in his stomach to see if this can be explained by his PEG tube removal and to verify if any further follow-up is needed ***   2) Nutritional Status: Stable, PEG has been removed *** Wt Readings from Last 3 Encounters:  12/23/23 276 lb 6.4 oz (125.4 kg)  12/03/23 271 lb (122.9 kg)  11/11/23 270 lb 8 oz (122.7 kg)    3) Risk Factors: The patient has been educated about risk factors including alcohol and tobacco abuse; they understand that avoidance of alcohol and tobacco is important to prevent recurrences as well as other cancers ***  4) Swallowing: He declined speech-language pathology AGAINST MEDICAL ADVICE.  He is noticing that water  goes through his nose if he swallows quickly.  Advised him to  swallow more slowly and to let us  know if he develops other issues ***  5) Dental: Using fluoride toothpaste and a Waterpik. Encouraged regular follow up with dentistry.  ***  6) Thyroid  function: Check Annually *** Lab Results  Component Value Date   TSH 3.380 11/11/2023    8) Follow-up: Patient is scheduled to see Dr. Loretha on 01/13/2024. ***  On date of service, in total, I spent 30 minutes on this encounter. Patient was seen in person. _____________________________________ PIERRETTE

## 2024-01-04 ENCOUNTER — Other Ambulatory Visit: Payer: Self-pay

## 2024-01-05 NOTE — Progress Notes (Addendum)
 Patient identity verified x2   Mr. Marsala returns today for routine follow-up to see Leeroy Due PA-C. He completed treatment to the right tonsil on 07/24/22.  Pain issues, if any: None Using a feeding tube?: None Weight changes, if any: Gained weight back  Wt Readings from Last 4 Encounters: 04/08/23  12/23/23 276 lb 6.4 oz (125.4 kg)  12/03/23 271 lb (122.9 kg)  11/11/23 270 lb 8 oz (122.7 kg)    Swallowing issues, if any: None Smoking or chewing tobacco? None Using fluoride toothpaste daily? Yes Last ENT visit was on: 3 months ago Other notable issues, if any:  Patient still having taste issues, water  doesn't taste the same  Encouraged patient to follow up with his primary care doctor.

## 2024-01-06 ENCOUNTER — Ambulatory Visit
Admission: RE | Admit: 2024-01-06 | Discharge: 2024-01-06 | Disposition: A | Discharge status: Home / Self Care | Payer: Self-pay | Source: Ambulatory Visit | Attending: Radiology | Admitting: Radiology

## 2024-01-06 ENCOUNTER — Telehealth: Payer: Self-pay | Admitting: Radiation Oncology

## 2024-01-06 ENCOUNTER — Ambulatory Visit: Payer: 59

## 2024-01-06 DIAGNOSIS — C090 Malignant neoplasm of tonsillar fossa: Secondary | ICD-10-CM | POA: Insufficient documentation

## 2024-01-06 DIAGNOSIS — Z79899 Other long term (current) drug therapy: Secondary | ICD-10-CM | POA: Insufficient documentation

## 2024-01-06 NOTE — Telephone Encounter (Signed)
 10/7 @ 11:48 am Left voicemail on patient and patient's spouse contract numbers for pt to call our office to be r/s for his missed appt on today.

## 2024-01-09 NOTE — Progress Notes (Signed)
 Radiation Oncology         [phone] ________________________________  Name: Aaron Moore MRN: [id number]  Date: 01/13/2024  DOB: [birth year]  Follow-Up Visit Note  CC: [name], DO  Pietro Norleen Barter, *  Diagnosis and Prior Radiotherapy:       ICD-10-CM   1. Cancer of tonsillar fossa (HCC)  C09.0        Cancer Staging  Cancer of tonsillar fossa (HCC) Staging form: Pharynx - HPV-Mediated Oropharynx, AJCC 8th Edition - Clinical stage from 05/08/2022: Stage II (cT3, cN1, cM0, p16+) - Signed by Izell Domino, MD on 05/08/2022 Stage prefix: Initial diagnosis    CHIEF COMPLAINT:  Here for follow-up and surveillance of tonsill cancer.   ==========DELIVERED PLANS==========  First Treatment Date: 2022-06-03 - Last Treatment Date: 2022-07-24   Plan Name: HN_R_Tonsil Site: Tonsil, Right Technique: IMRT Mode: Photon Dose Per Fraction: 2 Gy Prescribed Dose (Delivered / Prescribed): 70 Gy / 70 Gy Prescribed Fxs (Delivered / Prescribed): 35 / 35   Narrative:  The patient returns today for routine follow-up.  He completed treatment on 07/24/22.   Since he was last seen by us  he was found to have melanoma on his back. This has since been excised and he is currently undergoing Keytruda  under the care of Dr. Loretha.   Pain issues, if any: None Using a feeding tube?: None Weight changes, if any: Gained weight back  Wt Readings from Last 3 Encounters:  01/13/24 280 lb 14.4 oz (127.4 kg)  01/13/24 298 lb 12.8 oz (135.5 kg)  12/23/23 276 lb 6.4 oz (125.4 kg)  Swallowing issues, if any: None Smoking or chewing tobacco? None Using fluoride toothpaste daily? Yes Last ENT visit was on: 3 months ago Other notable issues, if any:  Patient still having taste issues, water  doesn't taste the same   ALLERGIES:  has no known allergies.  Meds: Current Outpatient Medications  Medication Sig Dispense Refill   allopurinol (ZYLOPRIM) 100 MG tablet Take 100 mg by mouth daily.      amLODipine -valsartan  (EXFORGE ) 10-320 MG tablet Take 1 tablet by mouth daily.     metoprolol succinate (TOPROL-XL) 50 MG 24 hr tablet Take 50 mg by mouth daily.     No current facility-administered medications for this encounter.   Facility-Administered Medications Ordered in Other Encounters  Medication Dose Route Frequency Provider Last Rate Last Admin   sodium chloride  flush (NS) 0.9 % injection 10 mL  10 mL Intracatheter Once Iruku, Praveena, MD        Physical Findings:  The patient is in no acute distress. Patient is alert and oriented. Wt Readings from Last 3 Encounters:  01/13/24 280 lb 14.4 oz (127.4 kg)  01/13/24 298 lb 12.8 oz (135.5 kg)  12/23/23 276 lb 6.4 oz (125.4 kg)    height is 6' 1 (1.854 m) and weight is 298 lb 12.8 oz (135.5 kg). His temperature is 97.7 F (36.5 C). His blood pressure is 185/109 (abnormal) and his pulse is 78. His respiration is 24 (abnormal) and oxygen saturation is 99%. .   General: Alert and oriented, in no acute distress HEENT: Head is normocephalic. Extraocular movements are intact. Oropharynx is notable for no concerning lesions or masses.  Neck: Neck is notable for no cervical, submandibular, or supraclavicular lymphadenopathy. Two sub-centimeter posterior auricular subcutaneous nodules.  Skin: well healed over neck Extremities: No cyanosis or edema. Lymphatics: see Neck Exam Psychiatric: Judgment and insight are intact. Affect is appropriate.   Lab Findings: Lab Results  Component Value Date   WBC 9.3 01/13/2024   HGB 14.4 01/13/2024   HCT 41.8 01/13/2024   MCV 85.0 01/13/2024   PLT 216 01/13/2024    Lab Results  Component Value Date   TSH 3.380 11/11/2023    Radiographic Findings: No results found.  Impression/Plan:    1) Head and Neck Cancer Status: Patient has healed well from the effects of his radiation treatment.    2) Nutritional Status: Stable, PEG has been removed  Wt Readings from Last 3 Encounters:   01/13/24 280 lb 14.4 oz (127.4 kg)  01/13/24 298 lb 12.8 oz (135.5 kg)  12/23/23 276 lb 6.4 oz (125.4 kg)   3) Risk Factors: The patient has been educated about risk factors including alcohol and tobacco abuse; they understand that avoidance of alcohol and tobacco is important to prevent recurrences as well as other cancers. Patient is not using tobaccos.   4) Swallowing: Functional  5) Dental: Using fluoride toothpaste. Encouraged regular follow up with dentistry.   6) Thyroid  function: Recommend checking this annually - we appreciate medical oncology checking this.  Lab Results  Component Value Date   TSH 3.380 11/11/2023   7) Incidental two sub-centimeter nodules at the posterior auricular region palpated on physical exam today. Likely benign in nature. Patient will let us  know if these enlarge or do not go away.   8) Follow-up: Patient will continue on Keytruda  under the care of Dr. Loretha. He will see ENT in 3 months and our head & neck survivorship clinic in 6 months. Radiation follow-up PRN. He was encouraged to call us  with any questions or concerns.    On date of service, in total, I spent 30 minutes on this encounter. Patient was seen in person. _____________________________________    Leeroy Due, PA-C

## 2024-01-09 NOTE — Progress Notes (Incomplete)
 Radiation Oncology         [phone] ________________________________  Name: Aaron Moore MRN: [id number]  Date: 01/13/2024  DOB: 05-Nov-1965  Follow-Up Visit Note  CC: [name], DO  Pietro Norleen Barter, *  Diagnosis and Prior Radiotherapy:     No diagnosis found.   Cancer Staging  Cancer of tonsillar fossa (HCC) Staging form: Pharynx - HPV-Mediated Oropharynx, AJCC 8th Edition - Clinical stage from 05/08/2022: Stage II (cT3, cN1, cM0, p16+) - Signed by Izell Domino, MD on 05/08/2022 Stage prefix: Initial diagnosis    CHIEF COMPLAINT:  Here for follow-up and surveillance of tonsill cancer.   ==========DELIVERED PLANS==========  First Treatment Date: 2022-06-03 - Last Treatment Date: 2022-07-24   Plan Name: HN_R_Tonsil Site: Tonsil, Right Technique: IMRT Mode: Photon Dose Per Fraction: 2 Gy Prescribed Dose (Delivered / Prescribed): 70 Gy / 70 Gy Prescribed Fxs (Delivered / Prescribed): 35 / 35   Narrative:  The patient returns today for routine follow-up.  He completed treatment on 07/24/22. ***  Since he was last seen by us  he was found to have melanoma on his pack. This has since been excised and he is currently undergoing Keytruda  under the care of Dr. Loretha.    ***   ALLERGIES:  has no known allergies.  Meds: Current Outpatient Medications  Medication Sig Dispense Refill   allopurinol (ZYLOPRIM) 100 MG tablet Take 100 mg by mouth daily.     amLODipine -valsartan  (EXFORGE ) 10-320 MG tablet Take 1 tablet by mouth daily.     metoprolol succinate (TOPROL-XL) 50 MG 24 hr tablet Take 50 mg by mouth daily.     No current facility-administered medications for this visit.   Facility-Administered Medications Ordered in Other Visits  Medication Dose Route Frequency Provider Last Rate Last Admin   sodium chloride  flush (NS) 0.9 % injection 10 mL  10 mL Intracatheter Once Iruku, Praveena, MD        Physical Findings: *** The patient is in no acute distress.  Patient is alert and oriented. Wt Readings from Last 3 Encounters:  12/23/23 276 lb 6.4 oz (125.4 kg)  12/03/23 271 lb (122.9 kg)  11/11/23 270 lb 8 oz (122.7 kg)    vitals were not taken for this visit. .   General: Alert and oriented, in no acute distress HEENT: Head is normocephalic. Extraocular movements are intact. Oropharynx is notable for no concerning lesions or masses.  Neck: Neck is notable for no cervical, submandibular, or supraclavicular lymphadenopathy.  Skin: well healed over neck Extremities: No cyanosis or edema. Lymphatics: see Neck Exam Psychiatric: Judgment and insight are intact. Affect is appropriate.   Lab Findings: Lab Results  Component Value Date   WBC 7.1 12/23/2023   HGB 15.1 12/23/2023   HCT 45.1 12/23/2023   MCV 86.2 12/23/2023   PLT 213 12/23/2023    Lab Results  Component Value Date   TSH 3.380 11/11/2023    Radiographic Findings: No results found.  Impression/Plan:    1) Head and Neck Cancer Status: I personally reviewed his PET scan images with him and his wife.  He has had a complete response to treatment.  Our patient navigator will contact radiology and interventional radiology about the incidental finding of the radiopaque foreign body in his stomach to see if this can be explained by his PEG tube removal and to verify if any further follow-up is needed ***   2) Nutritional Status: Stable, PEG has been removed *** Wt Readings from Last 3 Encounters:  12/23/23 276 lb 6.4 oz (125.4 kg)  12/03/23 271 lb (122.9 kg)  11/11/23 270 lb 8 oz (122.7 kg)    3) Risk Factors: The patient has been educated about risk factors including alcohol and tobacco abuse; they understand that avoidance of alcohol and tobacco is important to prevent recurrences as well as other cancers ***  4) Swallowing: He declined speech-language pathology AGAINST MEDICAL ADVICE.  He is noticing that water  goes through his nose if he swallows quickly.  Advised him to  swallow more slowly and to let us  know if he develops other issues ***  5) Dental: Using fluoride toothpaste and a Waterpik. Encouraged regular follow up with dentistry.  ***  6) Thyroid  function: Check Annually *** Lab Results  Component Value Date   TSH 3.380 11/11/2023    8) Follow-up: Patient is scheduled to see Dr. Loretha on 01/13/2024. ***  On date of service, in total, I spent 30 minutes on this encounter. Patient was seen in person. _____________________________________ PIERRETTE

## 2024-01-13 ENCOUNTER — Ambulatory Visit
Admission: RE | Admit: 2024-01-13 | Discharge: 2024-01-13 | Disposition: A | Discharge status: Home / Self Care | Source: Ambulatory Visit | Attending: Radiology | Admitting: Radiology

## 2024-01-13 ENCOUNTER — Inpatient Hospital Stay: Attending: Hematology and Oncology

## 2024-01-13 ENCOUNTER — Encounter: Payer: Self-pay | Admitting: Hematology and Oncology

## 2024-01-13 ENCOUNTER — Inpatient Hospital Stay

## 2024-01-13 ENCOUNTER — Inpatient Hospital Stay (HOSPITAL_BASED_OUTPATIENT_CLINIC_OR_DEPARTMENT_OTHER): Admitting: Hematology and Oncology

## 2024-01-13 VITALS — BP 162/90 | HR 77 | Temp 98.3°F | Resp 18 | Wt 280.9 lb

## 2024-01-13 VITALS — BP 185/109 | HR 78 | Temp 97.7°F | Resp 24 | Ht 73.0 in | Wt 298.8 lb

## 2024-01-13 DIAGNOSIS — C439 Malignant melanoma of skin, unspecified: Secondary | ICD-10-CM

## 2024-01-13 DIAGNOSIS — L219 Seborrheic dermatitis, unspecified: Secondary | ICD-10-CM | POA: Insufficient documentation

## 2024-01-13 DIAGNOSIS — Z79899 Other long term (current) drug therapy: Secondary | ICD-10-CM | POA: Diagnosis not present

## 2024-01-13 DIAGNOSIS — Z8546 Personal history of malignant neoplasm of prostate: Secondary | ICD-10-CM

## 2024-01-13 DIAGNOSIS — C090 Malignant neoplasm of tonsillar fossa: Secondary | ICD-10-CM | POA: Insufficient documentation

## 2024-01-13 DIAGNOSIS — Z5111 Encounter for antineoplastic chemotherapy: Secondary | ICD-10-CM

## 2024-01-13 DIAGNOSIS — Z923 Personal history of irradiation: Secondary | ICD-10-CM | POA: Insufficient documentation

## 2024-01-13 DIAGNOSIS — C4359 Malignant melanoma of other part of trunk: Secondary | ICD-10-CM | POA: Insufficient documentation

## 2024-01-13 DIAGNOSIS — C61 Malignant neoplasm of prostate: Secondary | ICD-10-CM | POA: Diagnosis not present

## 2024-01-13 DIAGNOSIS — C099 Malignant neoplasm of tonsil, unspecified: Secondary | ICD-10-CM

## 2024-01-13 DIAGNOSIS — Z5112 Encounter for antineoplastic immunotherapy: Secondary | ICD-10-CM | POA: Insufficient documentation

## 2024-01-13 LAB — CMP (CANCER CENTER ONLY)
ALT: 29 U/L (ref 0–44)
AST: 30 U/L (ref 15–41)
Albumin: 4.4 g/dL (ref 3.5–5.0)
Alkaline Phosphatase: 105 U/L (ref 38–126)
Anion gap: 7 (ref 5–15)
BUN: 26 mg/dL — ABNORMAL HIGH (ref 6–20)
CO2: 25 mmol/L (ref 22–32)
Calcium: 9.7 mg/dL (ref 8.9–10.3)
Chloride: 107 mmol/L (ref 98–111)
Creatinine: 1.38 mg/dL — ABNORMAL HIGH (ref 0.61–1.24)
GFR, Estimated: 59 mL/min — ABNORMAL LOW (ref >60)
Glucose, Bld: 95 mg/dL (ref 70–99)
Potassium: 3.7 mmol/L (ref 3.5–5.1)
Sodium: 139 mmol/L (ref 135–145)
Total Bilirubin: 0.7 mg/dL (ref 0.0–1.2)
Total Protein: 7.2 g/dL (ref 6.5–8.1)

## 2024-01-13 LAB — CBC WITH DIFFERENTIAL (CANCER CENTER ONLY)
Abs Immature Granulocytes: 0.03 K/uL (ref 0.00–0.07)
Basophils Absolute: 0 K/uL (ref 0.0–0.1)
Basophils Relative: 0 %
Eosinophils Absolute: 0.2 K/uL (ref 0.0–0.5)
Eosinophils Relative: 2 %
HCT: 41.8 % (ref 39.0–52.0)
Hemoglobin: 14.4 g/dL (ref 13.0–17.0)
Immature Granulocytes: 0 %
Lymphocytes Relative: 7 %
Lymphs Abs: 0.7 K/uL (ref 0.7–4.0)
MCH: 29.3 pg (ref 26.0–34.0)
MCHC: 34.4 g/dL (ref 30.0–36.0)
MCV: 85 fL (ref 80.0–100.0)
Monocytes Absolute: 0.9 K/uL (ref 0.1–1.0)
Monocytes Relative: 9 %
Neutro Abs: 7.5 K/uL (ref 1.7–7.7)
Neutrophils Relative %: 82 %
Platelet Count: 216 K/uL (ref 150–400)
RBC: 4.92 MIL/uL (ref 4.22–5.81)
RDW: 14.4 % (ref 11.5–15.5)
WBC Count: 9.3 K/uL (ref 4.0–10.5)
nRBC: 0 % (ref 0.0–0.2)

## 2024-01-13 LAB — T4, FREE: Free T4: 0.71 ng/dL (ref 0.61–1.12)

## 2024-01-13 LAB — TSH: TSH: 5.97 u[IU]/mL — ABNORMAL HIGH (ref 0.350–4.500)

## 2024-01-13 MED ORDER — SODIUM CHLORIDE 0.9 % IV SOLN
200.0000 mg | Freq: Once | INTRAVENOUS | Status: AC
  Administered 2024-01-13: 200 mg via INTRAVENOUS
  Filled 2024-01-13: qty 200

## 2024-01-13 MED ORDER — SODIUM CHLORIDE 0.9 % IV SOLN: INTRAVENOUS | Status: DC

## 2024-01-13 NOTE — Progress Notes (Signed)
 [facility] Cancer Follow up:    Aaron Brunet, DO [address] [place] [address]   DIAGNOSIS:  Cancer Staging  Cancer of tonsillar fossa (HCC) Staging form: Pharynx - HPV-Mediated Oropharynx, AJCC 8th Edition - Clinical stage from 05/08/2022: Stage II (cT3, cN1, cM0, p16+) - Signed by Izell Domino, MD on 05/08/2022 Stage prefix: Initial diagnosis   SUMMARY OF ONCOLOGIC HISTORY: Oncology History  Cancer of tonsillar fossa (HCC)  05/08/2022 Initial Diagnosis   Cancer of tonsillar fossa (HCC)   05/08/2022 Cancer Staging   Staging form: Pharynx - HPV-Mediated Oropharynx, AJCC 8th Edition - Clinical stage from 05/08/2022: Stage II (cT3, cN1, cM0, p16+) - Signed by Izell Domino, MD on 05/08/2022 Stage prefix: Initial diagnosis   06/05/2022 - 07/17/2022 Chemotherapy   Patient is on Treatment Plan : HEAD/NECK Cisplatin  (40) q7d     Melanoma of skin (HCC)  07/18/2023 Initial Diagnosis   Melanoma of skin (HCC)   07/28/2023 -  Chemotherapy   Patient is on Treatment Plan : MELANOMA Pembrolizumab  (200) q21d       CURRENT THERAPY: Concurrent chemoradiation  INTERVAL HISTORY:  Aaron Moore 58 y.o. male returns for follow-up.   Discussed the use of AI scribe software for clinical note transcription with the patient, who gave verbal consent to proceed.  History of Present Illness  Aaron Moore is a 58 year old male undergoing adjuvant immunotherapy with keytruda  who presents with dry skin and fatigue.  He experiences significant dry skin, particularly behind his ears and on his face over his eyebrows. The skin is very dry and flaky. He has not yet tried the recommended baby shampoo. He does not currently use a moisturizer.  He reports ongoing fatigue. There are no changes in breathing, bowel habits, or urination, although he notes that urinary issues come and go without hematuria.  He experiences leg swelling, which is variable in nature. The swelling does  not consistently correlate with his level of activity, as it can occur whether he is resting or active. Elevating his legs when sitting helps manage the swelling.  Rest of the pertinent 10 point ROS reviewed and neg.  Wt Readings from Last 3 Encounters:  01/13/24 280 lb 14.4 oz (127.4 kg)  01/13/24 298 lb 12.8 oz (135.5 kg)  12/23/23 276 lb 6.4 oz (125.4 kg)     Patient Active Problem List   Diagnosis Date Noted   Melanoma of skin (HCC) 07/18/2023   Port-A-Cath in place 06/04/2022   Cancer of tonsillar fossa (HCC) 05/08/2022   Prostate cancer (HCC) 02/07/2022   Medial epicondylitis of left elbow 03/08/2015   Obesity, Class II, BMI 35-39.9, with comorbidity 11/28/2010   Abdominal wall mass of epigastric region - old hernia sac/lipoma 11/28/2010   Abdominal pain, RLQ - muscle strain 11/28/2010    has no known allergies.  MEDICAL HISTORY: Past Medical History:  Diagnosis Date   Abdominal pain    Cancer (HCC)    prostate cancer, throat cancer, melanoma to back   Complication of anesthesia    slow to wake up 3 years ago after foot surgery   ED (erectile dysfunction)    Gout    Gout    History of kidney stones    Hypertension    Obesity, Class II, BMI 35-39.9, with comorbidity    Painful orthopaedic hardware    left foot    SURGICAL HISTORY: Past Surgical History:  Procedure Laterality Date   amputation of 2 toes on left  foot      BREAST SURGERY     CARPAL TUNNEL RELEASE  05/03/2011   Procedure: CARPAL TUNNEL RELEASE;  Surgeon: Arley JONELLE Curia, MD;  Location: [facility];  Service: Orthopedics;  Laterality: Left;   FOOT FRACTURE SURGERY Left    x3   HARDWARE REMOVAL Left 06/03/2019   Procedure: HARDWARE REMOVAL;  Surgeon: Elsa Lonni JONELLE, MD;  Location: [facility];  Service: Orthopedics;  Laterality: Left;  PROCEDURE: LEFT FOOT REMOVAL OF HARDWARE, IRRIGATION AND DEBRIDEMENT,PLACEMENT OF CEMENT SPACER, DEBULKIN, SOFT TISSUE MASS LENGTH OF  SURGERY: 2 HOURS   HERNIA REPAIR  02/06/2009   Lap supraumb & umb VWH repairs   INCISION AND DRAINAGE OF WOUND Left 06/03/2019   Procedure: IRRIGATION AND DEBRIDEMENT WOUND WITH IMPLANTATION OF ANTIBIOTIC CEMENT SPACER;  Surgeon: Elsa Lonni JONELLE, MD;  Location: [facility];  Service: Orthopedics;  Laterality: Left;   IR GASTROSTOMY TUBE MOD SED  05/29/2022   IR GASTROSTOMY TUBE REMOVAL  08/28/2022   IR IMAGING GUIDED PORT INSERTION  05/29/2022   IR REMOVAL TUN ACCESS W/ PORT W/O FL MOD SED  11/04/2022   left rotator cuff surgery      LYMPHADENECTOMY Bilateral 02/07/2022   Procedure: BILATERAL PELVIC LYMPHADENECTOMY;  Surgeon: Renda Glance, MD;  Location: [facility];  Service: Urology;  Laterality: Bilateral;   MASS EXCISION Left 06/03/2019   Procedure: DEBULKING LEFT FOOT MASS;  Surgeon: Elsa Lonni JONELLE, MD;  Location:  SURGERY CENTER;  Service: Orthopedics;  Laterality: Left;   MELANOMA EXCISION N/A 06/19/2023   Procedure: EXCISION, MELANOMA;  Surgeon: Aron Shoulders, MD;  Location: [facility];  Service: General;  Laterality: N/A;  WIDE LOCAL EXCISION BACK MELANOMA WITH ADVANCEMENT FLAP CLOSURE WITH SENTINEL NODE BIOPSY   ROBOT ASSISTED LAPAROSCOPIC RADICAL PROSTATECTOMY N/A 02/07/2022   Procedure: XI ROBOTIC ASSISTED LAPAROSCOPIC RADICAL PROSTATECTOMY LEVEL 3;  Surgeon: Renda Glance, MD;  Location: [facility];  Service: Urology;  Laterality: N/A;  210 MINUTES NEEDED FOR CASE   SENTINEL NODE BIOPSY Left 06/19/2023   Procedure: BIOPSY, LYMPH NODE, SENTINEL;  Surgeon: Aron Shoulders, MD;  Location: [facility];  Service: General;  Laterality: Left;    SOCIAL HISTORY: Social History   Socioeconomic History   Marital status: Married    Spouse name: Not on file   Number of children: Not on file   Years of education: Not on file   Highest education level: Not on file  Occupational History   Not on file  Tobacco Use   Smoking status: Never   Smokeless tobacco: Never   Vaping Use   Vaping status: Never Used  Substance and Sexual Activity   Alcohol use: No   Drug use: No   Sexual activity: Not on file  Other Topics Concern   Not on file  Social History Narrative   Not on file   Social Drivers of Health   Financial Resource Strain: Not on file  Food Insecurity: No Food Insecurity (02/07/2022)   Hunger Vital Sign    Worried About Running Out of Food in the Last Year: Never true    Ran Out of Food in the Last Year: Never true  Transportation Needs: No Transportation Needs (02/07/2022)   PRAPARE - Administrator, Civil Service (Medical): No    Lack of Transportation (Non-Medical): No  Physical Activity: Not on file  Stress: Not on file  Social Connections: Unknown (05/01/2022)   [facility]   Social Network  Social Network: Not on file  Intimate Partner Violence: Unknown (05/01/2022)   Received from [facility]   HITS    Physically Hurt: Not on file    Insult or Talk Down To: Not on file    Threaten Physical Harm: Not on file    Scream or Curse: Not on file    FAMILY HISTORY: Family History  Problem Relation Age of Onset   Diabetes Mother    Diabetes Father     PHYSICAL EXAMINATION  BP (!) 162/90 (BP Location: Left Arm, Patient Position: Sitting)   Pulse 77   Temp 98.3 F (36.8 C) (Temporal)   Resp 18   Wt 280 lb 14.4 oz (127.4 kg)   SpO2 95%   BMI 37.06 kg/m    Vitals:   01/13/24 1110  BP: (!) 162/90  Pulse: 77  Resp: 18  Temp: 98.3 F (36.8 C)  SpO2: 95%     Physical Exam Constitutional:      Appearance: Normal appearance.  Cardiovascular:     Rate and Rhythm: Normal rate and regular rhythm.     Pulses: Normal pulses.     Heart sounds: Normal heart sounds.  Pulmonary:     Effort: Pulmonary effort is normal.     Breath sounds: Normal breath sounds.  Abdominal:     General: Abdomen is flat.     Palpations: Abdomen is soft.  Musculoskeletal:        General: Normal range of  motion.     Cervical back: Normal range of motion and neck supple. No rigidity.  Lymphadenopathy:     Cervical: No cervical adenopathy.  Skin:    General: Skin is warm and dry.  Neurological:     General: No focal deficit present.     Mental Status: He is alert.  Psychiatric:        Mood and Affect: Mood normal.      LABORATORY DATA:  CBC    Component Value Date/Time   WBC 9.3 01/13/2024 1036   WBC 6.0 06/13/2023 0835   RBC 4.92 01/13/2024 1036   HGB 14.4 01/13/2024 1036   HCT 41.8 01/13/2024 1036   PLT 216 01/13/2024 1036   MCV 85.0 01/13/2024 1036   MCH 29.3 01/13/2024 1036   MCHC 34.4 01/13/2024 1036   RDW 14.4 01/13/2024 1036   LYMPHSABS 0.7 01/13/2024 1036   MONOABS 0.9 01/13/2024 1036   EOSABS 0.2 01/13/2024 1036   BASOSABS 0.0 01/13/2024 1036    CMP     Component Value Date/Time   NA 142 12/23/2023 0836   K 3.8 12/23/2023 0836   CL 108 12/23/2023 0836   CO2 28 12/23/2023 0836   GLUCOSE 99 12/23/2023 0836   BUN 24 (H) 12/23/2023 0836   CREATININE 1.38 (H) 12/23/2023 0836   CALCIUM 9.2 12/23/2023 0836   PROT 7.3 12/23/2023 0836   ALBUMIN 4.4 12/23/2023 0836   AST 23 12/23/2023 0836   ALT 26 12/23/2023 0836   ALKPHOS 98 12/23/2023 0836   BILITOT 0.5 12/23/2023 0836   GFRNONAA 59 (L) 12/23/2023 0836   GFRAA >60 10/21/2017 2005     ASSESSMENT & PLAN:  This is a very pleasant 58 year old male patient with 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 He had a chronic mole for the past 20 years but has noticed some change in  it in the past 6 months, the mole started growing and hence went to see his dermatologist, had an incision and a biopsy which showed malignant melanoma, Clark's level 4, all margins involved at least staged as a pT4a now back to medical oncology for additional  recommendations.  PET CT neg for metastatic disease. He had surgical excision from left back, residual melanoma in situ, no residual invasive disease, 1/6 SLN pos for melanoma, final path staging [id number].  We will plan to send BRAF mutation on the surgical pathology specimen.  He is on adj Keytruda    Assessment and Plan Assessment & Plan  History of head and neck cancer, status post radiation and immunotherapy No evidence of recurrence on recent imaging.   Malignant melanoma of skin of trunk on adj keytruda  Undergoing Keytruda  immunotherapy with fatigue and dry skin as a side effect.  Prostate cancer, status post treatment, under active surveillance Prostate cancer under active surveillance post-treatment.  - PSA from today pending - Encourage follow-up with urology  Dry skin and facial rash secondary to immunotherapy Persistent dry skin and facial rash likely due to immunotherapy. - Apply Cerave moisturizer to affected areas. - Consider mild steroid cream if rash persists.  Seborrheic dermatitis Recommended Nizoral shampoo.  Time spent: 20 min  *Total Encounter Time as defined by the Centers for Medicare and Medicaid Services includes, in addition to the face-to-face time of a patient visit (documented in the note above) non-face-to-face time: obtaining and reviewing outside history, ordering and reviewing medications, tests or procedures, care coordination (communications with other health care professionals or caregivers) and documentation in the medical record.

## 2024-01-13 NOTE — Progress Notes (Signed)
  Patient identity verified x2    Mr. Ishida returns today for routine follow-up to see Leeroy Due PA-C. He completed treatment to the right tonsil on 07/24/22.   Pain issues, if any: None Using a feeding tube?: None Weight changes, if any: Gained weight back     Wt Readings from Last 4 Encounters: 04/08/23  12/23/23 276 lb 6.4 oz (125.4 kg)  12/03/23 271 lb (122.9 kg)  11/11/23 270 lb 8 oz (122.7 kg)    Swallowing issues, if any: None Smoking or chewing tobacco? None Using fluoride toothpaste daily? Yes Last ENT visit was on: 3 months ago Other notable issues, if any:  Patient still having taste issues, water  doesn't taste the same

## 2024-01-13 NOTE — Patient Instructions (Signed)

## 2024-01-13 NOTE — Progress Notes (Signed)
  [facility] Center Cancer Follow up:    Aaron Brunet, DO [address] [place] [address]   DIAGNOSIS:  Cancer Staging  Cancer of tonsillar fossa (HCC) Staging form: Pharynx - HPV-Mediated Oropharynx, AJCC 8th Edition - Clinical stage from 05/08/2022: Stage II (cT3, cN1, cM0, p16+) - Signed by Izell Domino, MD on 05/08/2022 Stage prefix: Initial diagnosis   SUMMARY OF ONCOLOGIC HISTORY: Oncology History  Cancer of tonsillar fossa (HCC)  05/08/2022 Initial Diagnosis   Cancer of tonsillar fossa (HCC)   05/08/2022 Cancer Staging   Staging form: Pharynx - HPV-Mediated Oropharynx, AJCC 8th Edition - Clinical stage from 05/08/2022: Stage II (cT3, cN1, cM0, p16+) - Signed by Izell Domino, MD on 05/08/2022 Stage prefix: Initial diagnosis   06/05/2022 - 07/17/2022 Chemotherapy   Patient is on Treatment Plan : HEAD/NECK Cisplatin  (40) q7d     Melanoma of skin (HCC)  07/18/2023 Initial Diagnosis   Melanoma of skin (HCC)   07/28/2023 -  Chemotherapy   Patient is on Treatment Plan : MELANOMA Pembrolizumab  (200) q21d       CURRENT THERAPY: Concurrent chemoradiation  INTERVAL HISTORY:  Aaron Moore 58 y.o. male returns for follow-up.   Discussed the use of AI scribe software for clinical note transcription with the patient, who gave verbal consent to proceed.  History of Present Illness Aaron Moore is a 58 year old male with melanoma/H and N cancer and prostate cancer who presents for follow-up in hematology and medical oncology.  He is currently undergoing adj treatment for melanoma, which began in April and are expected to continue until April of the following year.   Rest of the pertinent 10 point ROS reviewed and neg.  Wt Readings from Last 3 Encounters:  01/13/24 280 lb 14.4 oz (127.4 kg)  01/13/24 298 lb 12.8 oz (135.5 kg)  12/23/23 276 lb 6.4 oz (125.4 kg)     Patient Active Problem List   Diagnosis Date Noted   Melanoma of skin (HCC) 07/18/2023    Port-A-Cath in place 06/04/2022   Cancer of tonsillar fossa (HCC) 05/08/2022   Prostate cancer (HCC) 02/07/2022   Medial epicondylitis of left elbow 03/08/2015   Obesity, Class II, BMI 35-39.9, with comorbidity 11/28/2010   Abdominal wall mass of epigastric region - old hernia sac/lipoma 11/28/2010   Abdominal pain, RLQ - muscle strain 11/28/2010    has no known allergies.  MEDICAL HISTORY: Past Medical History:  Diagnosis Date   Abdominal pain    Cancer (HCC)    prostate cancer, throat cancer, melanoma to back   Complication of anesthesia    slow to wake up 3 years ago after foot surgery   ED (erectile dysfunction)    Gout    Gout    History of kidney stones    Hypertension    Obesity, Class II, BMI 35-39.9, with comorbidity    Painful orthopaedic hardware    left foot    SURGICAL HISTORY: Past Surgical History:  Procedure Laterality Date   amputation of 2 toes on left foot      BREAST SURGERY     CARPAL TUNNEL RELEASE  05/03/2011   Procedure: CARPAL TUNNEL RELEASE;  Surgeon: Arley JONELLE Curia, MD;  Location: [facility];  Service: Orthopedics;  Laterality: Left;   FOOT FRACTURE SURGERY Left    x3   HARDWARE REMOVAL Left 06/03/2019   Procedure: HARDWARE REMOVAL;  Surgeon: Elsa Lonni JONELLE, MD;  Location:  SURGERY CENTER;  Service: Orthopedics;  Laterality: Left;  PROCEDURE: LEFT FOOT REMOVAL OF HARDWARE, IRRIGATION AND DEBRIDEMENT,PLACEMENT OF CEMENT SPACER, DEBULKIN, SOFT TISSUE MASS LENGTH OF SURGERY: 2 HOURS   HERNIA REPAIR  02/06/2009   Lap supraumb & umb VWH repairs   INCISION AND DRAINAGE OF WOUND Left 06/03/2019   Procedure: IRRIGATION AND DEBRIDEMENT WOUND WITH IMPLANTATION OF ANTIBIOTIC CEMENT SPACER;  Surgeon: Elsa Lonni SAUNDERS, MD;  Location: [facility];  Service: Orthopedics;  Laterality: Left;   IR GASTROSTOMY TUBE MOD SED  05/29/2022   IR GASTROSTOMY TUBE REMOVAL  08/28/2022   IR IMAGING GUIDED PORT INSERTION   05/29/2022   IR REMOVAL TUN ACCESS W/ PORT W/O FL MOD SED  11/04/2022   left rotator cuff surgery      LYMPHADENECTOMY Bilateral 02/07/2022   Procedure: BILATERAL PELVIC LYMPHADENECTOMY;  Surgeon: Renda Glance, MD;  Location: [facility];  Service: Urology;  Laterality: Bilateral;   MASS EXCISION Left 06/03/2019   Procedure: DEBULKING LEFT FOOT MASS;  Surgeon: Elsa Lonni SAUNDERS, MD;  Location: [facility];  Service: Orthopedics;  Laterality: Left;   MELANOMA EXCISION N/A 06/19/2023   Procedure: EXCISION, MELANOMA;  Surgeon: Aron Shoulders, MD;  Location: [facility];  Service: General;  Laterality: N/A;  WIDE LOCAL EXCISION BACK MELANOMA WITH ADVANCEMENT FLAP CLOSURE WITH SENTINEL NODE BIOPSY   ROBOT ASSISTED LAPAROSCOPIC RADICAL PROSTATECTOMY N/A 02/07/2022   Procedure: XI ROBOTIC ASSISTED LAPAROSCOPIC RADICAL PROSTATECTOMY LEVEL 3;  Surgeon: Renda Glance, MD;  Location: [facility];  Service: Urology;  Laterality: N/A;  210 MINUTES NEEDED FOR CASE   SENTINEL NODE BIOPSY Left 06/19/2023   Procedure: BIOPSY, LYMPH NODE, SENTINEL;  Surgeon: Aron Shoulders, MD;  Location: [facility];  Service: General;  Laterality: Left;    SOCIAL HISTORY: Social History   Socioeconomic History   Marital status: Married    Spouse name: Not on file   Number of children: Not on file   Years of education: Not on file   Highest education level: Not on file  Occupational History   Not on file  Tobacco Use   Smoking status: Never   Smokeless tobacco: Never  Vaping Use   Vaping status: Never Used  Substance and Sexual Activity   Alcohol use: No   Drug use: No   Sexual activity: Not on file  Other Topics Concern   Not on file  Social History Narrative   Not on file   Social Drivers of Health   Financial Resource Strain: Not on file  Food Insecurity: No Food Insecurity (02/07/2022)   Hunger Vital Sign    Worried About Running Out of Food in the Last Year: Never true    Ran Out of Food in the Last  Year: Never true  Transportation Needs: No Transportation Needs (02/07/2022)   PRAPARE - Administrator, Civil Service (Medical): No    Lack of Transportation (Non-Medical): No  Physical Activity: Not on file  Stress: Not on file  Social Connections: Unknown (05/01/2022)   Received from [facility]   Social Network    Social Network: Not on file  Intimate Partner Violence: Unknown (05/01/2022)   Received from [facility]   HITS    Physically Hurt: Not on file    Insult or Talk Down To: Not on file    Threaten Physical Harm: Not on file    Scream or Curse: Not on file    FAMILY HISTORY: Family History  Problem Relation Age of Onset   Diabetes Mother  Diabetes Father     PHYSICAL EXAMINATION  BP (!) 162/90 (BP Location: Left Arm, Patient Position: Sitting)   Pulse 77   Temp 98.3 F (36.8 C) (Temporal)   Resp 18   Wt 280 lb 14.4 oz (127.4 kg)   SpO2 95%   BMI 37.06 kg/m    Vitals:   01/13/24 1110  BP: (!) 162/90  Pulse: 77  Resp: 18  Temp: 98.3 F (36.8 C)  SpO2: 95%     Physical Exam Constitutional:      Appearance: Normal appearance.  Cardiovascular:     Rate and Rhythm: Normal rate and regular rhythm.     Pulses: Normal pulses.     Heart sounds: Normal heart sounds.  Pulmonary:     Effort: Pulmonary effort is normal.     Breath sounds: Normal breath sounds.  Abdominal:     General: Abdomen is flat.     Palpations: Abdomen is soft.  Musculoskeletal:        General: Normal range of motion.     Cervical back: Normal range of motion and neck supple. No rigidity.  Lymphadenopathy:     Cervical: No cervical adenopathy.  Skin:    General: Skin is warm and dry.  Neurological:     General: No focal deficit present.     Mental Status: He is alert.  Psychiatric:        Mood and Affect: Mood normal.      LABORATORY DATA:  CBC    Component Value Date/Time   WBC 9.3 01/13/2024 1036   WBC 6.0 06/13/2023 0835   RBC 4.92 01/13/2024  1036   HGB 14.4 01/13/2024 1036   HCT 41.8 01/13/2024 1036   PLT 216 01/13/2024 1036   MCV 85.0 01/13/2024 1036   MCH 29.3 01/13/2024 1036   MCHC 34.4 01/13/2024 1036   RDW 14.4 01/13/2024 1036   LYMPHSABS 0.7 01/13/2024 1036   MONOABS 0.9 01/13/2024 1036   EOSABS 0.2 01/13/2024 1036   BASOSABS 0.0 01/13/2024 1036    CMP     Component Value Date/Time   NA 142 12/23/2023 0836   K 3.8 12/23/2023 0836   CL 108 12/23/2023 0836   CO2 28 12/23/2023 0836   GLUCOSE 99 12/23/2023 0836   BUN 24 (H) 12/23/2023 0836   CREATININE 1.38 (H) 12/23/2023 0836   CALCIUM 9.2 12/23/2023 0836   PROT 7.3 12/23/2023 0836   ALBUMIN 4.4 12/23/2023 0836   AST 23 12/23/2023 0836   ALT 26 12/23/2023 0836   ALKPHOS 98 12/23/2023 0836   BILITOT 0.5 12/23/2023 0836   GFRNONAA 59 (L) 12/23/2023 0836   GFRAA >60 10/21/2017 2005     ASSESSMENT & PLAN:  This is a very pleasant 58 year old male patient with 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 He had a chronic mole for the past 20 years but has noticed some change in it in the past 6 months, the mole started growing and hence went to see his dermatologist, had an incision and a biopsy which showed malignant melanoma, Clark's level 4, all margins involved at least staged as a pT4a now back to medical oncology for additional recommendations.  PET CT neg for metastatic disease. He had surgical excision from left back, residual melanoma in situ, no residual invasive disease, 1/6 SLN pos for melanoma, final path staging  PT4aN1.  We will plan to send BRAF mutation on the surgical pathology specimen.  He is on adj Keytruda    Assessment and Plan Assessment & Plan  History of head and neck cancer, status post radiation and immunotherapy No evidence of recurrence on recent imaging.  Keytruda  well-tolerated hematologically. Fatigue noted as a side effect. Radiation resulted in neck scar tissue. - Continue Keytruda . - Monitor for Keytruda  side effects, including fatigue.  Malignant melanoma of skin of trunk on adj keytruda  Undergoing Keytruda  immunotherapy with fatigue as a side effect. Treatment is more tolerable than previous chemotherapy. Emphasized the curability of melanoma and the importance of continuing treatment. - Continue Keytruda  immunotherapy. - Prostate cancer, status post treatment, under active surveillance Prostate cancer under active surveillance post-treatment. Persistent urinary symptoms possibly post-treatment effects. - Order PSA test for next visit. - Encourage follow-up with urology.  Urinary incontinence following prostate cancer treatment Urinary incontinence common post-treatment with milky urine and odor.  Seborrheic dermatitis of scalp and periauricular area Seborrheic dermatitis with scaliness and flaking. Head & Shoulders ineffective. - Recommend Nizoral shampoo for scalp and periauricular area.   Time spent: 20 min  *Total Encounter Time as defined by the Centers for Medicare and Medicaid Services includes, in addition to the face-to-face time of a patient visit (documented in the note above) non-face-to-face time: obtaining and reviewing outside history, ordering and reviewing medications, tests or procedures, care coordination (communications with other health care professionals or caregivers) and documentation in the medical record.

## 2024-01-14 ENCOUNTER — Other Ambulatory Visit: Payer: Self-pay

## 2024-01-14 DIAGNOSIS — C090 Malignant neoplasm of tonsillar fossa: Secondary | ICD-10-CM

## 2024-01-14 LAB — PSA, TOTAL AND FREE
PSA, Free Pct: UNDETERMINED %
PSA, Free: <0.02 ng/mL
Prostate Specific Ag, Serum: <0.1 ng/mL (ref 0.0–4.0)

## 2024-01-14 LAB — T4: T4, Total: 6.7 ug/dL (ref 4.5–12.0)

## 2024-01-14 NOTE — Progress Notes (Signed)
 Oncology Nurse Navigator Documentation   I was asked by Ronita Due PA to schedule Mr. Bromwell with Dr. Pietro (ENT) in 3 months for continued follow up after his treatment for head and neck cancer. I called Dr. Vertie office and was able to get him scheduled on 04/16/24 at 9:00. I called Mr. Medford wife back and informed her of the appointment date/time and she was agreeable. She knows to call me if she has any further questions or concerns.  Delon Jefferson RN, BSN, OCN Head & Neck Oncology Nurse Navigator [facility] Phone # [phone]  Fax # [phone]

## 2024-02-03 ENCOUNTER — Inpatient Hospital Stay (HOSPITAL_BASED_OUTPATIENT_CLINIC_OR_DEPARTMENT_OTHER): Admitting: Hematology and Oncology

## 2024-02-03 ENCOUNTER — Inpatient Hospital Stay: Attending: Hematology and Oncology

## 2024-02-03 ENCOUNTER — Inpatient Hospital Stay

## 2024-02-03 VITALS — BP 168/85 | HR 102 | Temp 97.7°F | Resp 18 | Wt 262.3 lb

## 2024-02-03 DIAGNOSIS — R21 Rash and other nonspecific skin eruption: Secondary | ICD-10-CM

## 2024-02-03 DIAGNOSIS — C439 Malignant melanoma of skin, unspecified: Secondary | ICD-10-CM | POA: Diagnosis not present

## 2024-02-03 DIAGNOSIS — Z5112 Encounter for antineoplastic immunotherapy: Secondary | ICD-10-CM

## 2024-02-03 LAB — CMP (CANCER CENTER ONLY)
ALT: 20 U/L (ref 0–44)
AST: 20 U/L (ref 15–41)
Albumin: 4.2 g/dL (ref 3.5–5.0)
Alkaline Phosphatase: 102 U/L (ref 38–126)
Anion gap: 5 (ref 5–15)
BUN: 17 mg/dL (ref 6–20)
CO2: 28 mmol/L (ref 22–32)
Calcium: 9.2 mg/dL (ref 8.9–10.3)
Chloride: 108 mmol/L (ref 98–111)
Creatinine: 1.35 mg/dL — ABNORMAL HIGH (ref 0.61–1.24)
GFR, Estimated: >60 mL/min (ref >60)
Glucose, Bld: 97 mg/dL (ref 70–99)
Potassium: 3.8 mmol/L (ref 3.5–5.1)
Sodium: 141 mmol/L (ref 135–145)
Total Bilirubin: 0.8 mg/dL (ref 0.0–1.2)
Total Protein: 7.1 g/dL (ref 6.5–8.1)

## 2024-02-03 LAB — CBC WITH DIFFERENTIAL (CANCER CENTER ONLY)
Abs Immature Granulocytes: 0.03 K/uL (ref 0.00–0.07)
Basophils Absolute: 0 K/uL (ref 0.0–0.1)
Basophils Relative: 0 %
Eosinophils Absolute: 0.3 K/uL (ref 0.0–0.5)
Eosinophils Relative: 4 %
HCT: 43.3 % (ref 39.0–52.0)
Hemoglobin: 14.6 g/dL (ref 13.0–17.0)
Immature Granulocytes: 0 %
Lymphocytes Relative: 9 %
Lymphs Abs: 0.7 K/uL (ref 0.7–4.0)
MCH: 29.2 pg (ref 26.0–34.0)
MCHC: 33.7 g/dL (ref 30.0–36.0)
MCV: 86.6 fL (ref 80.0–100.0)
Monocytes Absolute: 0.7 K/uL (ref 0.1–1.0)
Monocytes Relative: 10 %
Neutro Abs: 5.6 K/uL (ref 1.7–7.7)
Neutrophils Relative %: 77 %
Platelet Count: 210 K/uL (ref 150–400)
RBC: 5 MIL/uL (ref 4.22–5.81)
RDW: 14.4 % (ref 11.5–15.5)
WBC Count: 7.3 K/uL (ref 4.0–10.5)
nRBC: 0 % (ref 0.0–0.2)

## 2024-02-03 MED ORDER — TRIAMCINOLONE ACETONIDE 0.1 % EX LOTN: 1.0000 | TOPICAL_LOTION | Freq: Two times a day (BID) | CUTANEOUS | 0 refills | Status: DC

## 2024-02-03 NOTE — Progress Notes (Signed)
 [facility] Cancer Follow up:    Aaron Brunet, DO [address] [place] [address] [place] [address]   DIAGNOSIS:  Cancer Staging  Cancer of tonsillar fossa (HCC) Staging form: Pharynx - HPV-Mediated Oropharynx, AJCC 8th Edition - Clinical stage from 05/08/2022: Stage II (cT3, cN1, cM0, p16+) - Signed by Izell Domino, MD on 05/08/2022 Stage prefix: Initial diagnosis   SUMMARY OF ONCOLOGIC HISTORY: Oncology History  Cancer of tonsillar fossa (HCC)  05/08/2022 Initial Diagnosis   Cancer of tonsillar fossa (HCC)   05/08/2022 Cancer Staging   Staging form: Pharynx - HPV-Mediated Oropharynx, AJCC 8th Edition - Clinical stage from 05/08/2022: Stage II (cT3, cN1, cM0, p16+) - Signed by Izell Domino, MD on 05/08/2022 Stage prefix: Initial diagnosis   06/05/2022 - 07/17/2022 Chemotherapy   Patient is on Treatment Plan : HEAD/NECK Cisplatin  (40) q7d     Melanoma of skin (HCC)  07/18/2023 Initial Diagnosis   Melanoma of skin (HCC)   07/28/2023 -  Chemotherapy   Patient is on Treatment Plan : MELANOMA Pembrolizumab  (200) q21d       CURRENT THERAPY: Concurrent chemoradiation  INTERVAL HISTORY:  Aaron Moore 58 y.o. male returns for follow-up.   Discussed the use of AI scribe software for clinical note transcription with the patient, who gave verbal consent to proceed.  History of Present Illness Aaron Moore is a 58 year old male who presents with worsening pruritic skin rash.  He has experienced a worsening pruritic skin rash since his last visit, primarily located on the neck, chest wall, back, and scalp, with the scalp being the most severely affected area. The rash is characterized by itching, blistering, and subsequent peeling of the skin. His wife has observed small blisters that precede the itching. There is a change in skin tone in some areas.  He has not used any steroid cream for this condition previously. There is significant dryness in the affected areas,  particularly on the chest wall and back, although the back is less severely affected than the chest. The rash has not spread to his mouth.  He has completed over nine cycles of keytruda  treatment, which may be related to his current skin condition. He experiences fatigue as well related to immunotherapy.   Rest of the pertinent 10 point ROS reviewed and neg.  Wt Readings from Last 3 Encounters:  02/03/24 262 lb 4.8 oz (119 kg)  01/13/24 298 lb 12.8 oz (135.5 kg)  01/13/24 280 lb 14.4 oz (127.4 kg)     Patient Active Problem List   Diagnosis Date Noted   Melanoma of skin (HCC) 07/18/2023   Port-A-Cath in place 06/04/2022   Cancer of tonsillar fossa (HCC) 05/08/2022   Prostate cancer (HCC) 02/07/2022   Medial epicondylitis of left elbow 03/08/2015   Obesity, Class II, BMI 35-39.9, with comorbidity 11/28/2010   Abdominal wall mass of epigastric region - old hernia sac/lipoma 11/28/2010   Abdominal pain, RLQ - muscle strain 11/28/2010    has no known allergies.  MEDICAL HISTORY: Past Medical History:  Diagnosis Date   Abdominal pain    Cancer (HCC)    prostate cancer, throat cancer, melanoma to back   Complication of anesthesia    slow to wake up 3 years ago after foot surgery   ED (erectile dysfunction)    Gout    Gout    History of kidney stones    Hypertension    Obesity, Class II, BMI 35-39.9, with comorbidity    Painful orthopaedic  hardware    left foot    SURGICAL HISTORY: Past Surgical History:  Procedure Laterality Date   amputation of 2 toes on left foot      BREAST SURGERY     CARPAL TUNNEL RELEASE  05/03/2011   Procedure: CARPAL TUNNEL RELEASE;  Surgeon: Arley JONELLE Curia, MD;  Location: [facility];  Service: Orthopedics;  Laterality: Left;   FOOT FRACTURE SURGERY Left    x3   HARDWARE REMOVAL Left 06/03/2019   Procedure: HARDWARE REMOVAL;  Surgeon: Elsa Lonni JONELLE, MD;  Location: [facility];  Service: Orthopedics;   Laterality: Left;  PROCEDURE: LEFT FOOT REMOVAL OF HARDWARE, IRRIGATION AND DEBRIDEMENT,PLACEMENT OF CEMENT SPACER, DEBULKIN, SOFT TISSUE MASS LENGTH OF SURGERY: 2 HOURS   HERNIA REPAIR  02/06/2009   Lap supraumb & umb VWH repairs   INCISION AND DRAINAGE OF WOUND Left 06/03/2019   Procedure: IRRIGATION AND DEBRIDEMENT WOUND WITH IMPLANTATION OF ANTIBIOTIC CEMENT SPACER;  Surgeon: Elsa Lonni JONELLE, MD;  Location: [facility];  Service: Orthopedics;  Laterality: Left;   IR GASTROSTOMY TUBE MOD SED  05/29/2022   IR GASTROSTOMY TUBE REMOVAL  08/28/2022   IR IMAGING GUIDED PORT INSERTION  05/29/2022   IR REMOVAL TUN ACCESS W/ PORT W/O FL MOD SED  11/04/2022   left rotator cuff surgery      LYMPHADENECTOMY Bilateral 02/07/2022   Procedure: BILATERAL PELVIC LYMPHADENECTOMY;  Surgeon: Renda Glance, MD;  Location: [facility];  Service: Urology;  Laterality: Bilateral;   MASS EXCISION Left 06/03/2019   Procedure: DEBULKING LEFT FOOT MASS;  Surgeon: Elsa Lonni JONELLE, MD;  Location: [facility];  Service: Orthopedics;  Laterality: Left;   MELANOMA EXCISION N/A 06/19/2023   Procedure: EXCISION, MELANOMA;  Surgeon: Aron Shoulders, MD;  Location[facility];  Service: General;  Laterality: N/A;  WIDE LOCAL EXCISION BACK MELANOMA WITH ADVANCEMENT FLAP CLOSURE WITH SENTINEL NODE BIOPSY   ROBOT ASSISTED LAPAROSCOPIC RADICAL PROSTATECTOMY N/A 02/07/2022   Procedure: XI ROBOTIC ASSISTED LAPAROSCOPIC RADICAL PROSTATECTOMY LEVEL 3;  Surgeon: Renda Glance, MD;  Location: [facility];  Service: Urology;  Laterality: N/A;  210 MINUTES NEEDED FOR CASE   SENTINEL NODE BIOPSY Left 06/19/2023   Procedure: BIOPSY, LYMPH NODE, SENTINEL;  Surgeon: Aron Shoulders, MD;  Location: [facility];  Service: General;  Laterality: Left;    SOCIAL HISTORY: Social History   Socioeconomic History   Marital status: Married    Spouse name: Not on file   Number of children: Not on file   Years of education: Not on  file   Highest education level: Not on file  Occupational History   Not on file  Tobacco Use   Smoking status: Never   Smokeless tobacco: Never  Vaping Use   Vaping status: Never Used  Substance and Sexual Activity   Alcohol use: No   Drug use: No   Sexual activity: Not on file  Other Topics Concern   Not on file  Social History Narrative   Not on file   Social Drivers of Health   Financial Resource Strain: Not on file  Food Insecurity: No Food Insecurity (02/07/2022)   Hunger Vital Sign    Worried About Running Out of Food in the Last Year: Never true    Ran Out of Food in the Last Year: Never true  Transportation Needs: No Transportation Needs (02/07/2022)   PRAPARE - Administrator, Civil Service (Medical): No    Lack of Transportation (Non-Medical): No  Physical Activity: Not on file  Stress: Not on file  Social Connections: Unknown (05/01/2022)   Received from [facility]   Social Network    Social Network: Not on file  Intimate Partner Violence: Unknown (05/01/2022)   Received from [facility]   HITS    Physically Hurt: Not on file    Insult or Talk Down To: Not on file    Threaten Physical Harm: Not on file    Scream or Curse: Not on file    FAMILY HISTORY: Family History  Problem Relation Age of Onset   Diabetes Mother    Diabetes Father     PHYSICAL EXAMINATION  BP (!) 168/85 (BP Location: Left Arm, Patient Position: Sitting) Comment: 1st BP 178/90; 2nd BP 168/85  Pulse (!) 102   Temp 97.7 F (36.5 C) (Temporal)   Resp 18   Wt 262 lb 4.8 oz (119 kg)   SpO2 98%   BMI 34.61 kg/m    Vitals:   02/03/24 0857  BP: (!) 168/85  Pulse: (!) 102  Resp: 18  Temp: 97.7 F (36.5 C)  SpO2: 98%     Physical Exam Constitutional:      Appearance: Normal appearance.  Cardiovascular:     Rate and Rhythm: Normal rate and regular rhythm.     Pulses: Normal pulses.     Heart sounds: Normal heart sounds.  Pulmonary:     Effort: Pulmonary  effort is normal.     Breath sounds: Normal breath sounds.  Abdominal:     General: Abdomen is flat.     Palpations: Abdomen is soft.  Musculoskeletal:        General: Normal range of motion.     Cervical back: Normal range of motion and neck supple. No rigidity.  Lymphadenopathy:     Cervical: No cervical adenopathy.  Skin:    General: Skin is warm and dry.     Comments: Desquamating rash on the fore head, scap anterior chest wall. Mild to moderate.  Neurological:     General: No focal deficit present.     Mental Status: He is alert.  Psychiatric:        Mood and Affect: Mood normal.      LABORATORY DATA:  CBC    Component Value Date/Time   WBC 7.3 02/03/2024 0838   WBC 6.0 06/13/2023 0835   RBC 5.00 02/03/2024 0838   HGB 14.6 02/03/2024 0838   HCT 43.3 02/03/2024 0838   PLT 210 02/03/2024 0838   MCV 86.6 02/03/2024 0838   MCH 29.2 02/03/2024 0838   MCHC 33.7 02/03/2024 0838   RDW 14.4 02/03/2024 0838   LYMPHSABS 0.7 02/03/2024 0838   MONOABS 0.7 02/03/2024 0838   EOSABS 0.3 02/03/2024 0838   BASOSABS 0.0 02/03/2024 0838    CMP     Component Value Date/Time   NA 139 01/13/2024 1036   K 3.7 01/13/2024 1036   CL 107 01/13/2024 1036   CO2 25 01/13/2024 1036   GLUCOSE 95 01/13/2024 1036   BUN 26 (H) 01/13/2024 1036   CREATININE 1.38 (H) 01/13/2024 1036   CALCIUM 9.7 01/13/2024 1036   PROT 7.2 01/13/2024 1036   ALBUMIN 4.4 01/13/2024 1036   AST 30 01/13/2024 1036   ALT 29 01/13/2024 1036   ALKPHOS 105 01/13/2024 1036   BILITOT 0.7 01/13/2024 1036   GFRNONAA 59 (L) 01/13/2024 1036   GFRAA >60 10/21/2017 2005     ASSESSMENT & PLAN:  This is a very  pleasant 58 year old male patient with 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 He had a chronic mole for the past 20 years but has  noticed some change in it in the past 6 months, the mole started growing and hence went to see his dermatologist, had an incision and a biopsy which showed malignant melanoma, Clark's level 4, all margins involved at least staged as a pT4a now back to medical oncology for additional recommendations.  PET CT neg for metastatic disease. He had surgical excision from left back, residual melanoma in situ, no residual invasive disease, 1/6 SLN pos for melanoma, final path staging PT4aN1.  We will plan to send BRAF mutation on the surgical pathology specimen.  He is on adj Keytruda    Assessment and Plan Assessment & Plan  Immunotherapy-induced rash and pruritus Rash and pruritus worsened with moderate dermatological toxicity. Immunotherapy held. - Prescribed Kenalog lotion twice daily to scalp, chest wall, and back. - Hold immunotherapy until rash improves. - Consult dermatologist for treatment plan. - Schedule follow-up call next week to assess response. - Consider stronger corticosteroids or oral medication if no improvement. - Reschedule immunotherapy for February 24, 2024, if rash improves.  History of head and neck cancer, status post radiation and immunotherapy No evidence of recurrence on recent imaging.   Prostate cancer, status post treatment, under active surveillance Prostate cancer under active surveillance post-treatment.  - PSA from today pending - Encourage follow-up with urology   Time spent: 30 min  *Total Encounter Time as defined by the Centers for Medicare and Medicaid Services includes, in addition to the face-to-face time of a patient visit (documented in the note above) non-face-to-face time: obtaining and reviewing outside history, ordering and reviewing medications, tests or procedures, care coordination (communications with other health care professionals or caregivers) and documentation in the medical record.

## 2024-02-05 ENCOUNTER — Other Ambulatory Visit: Payer: Self-pay

## 2024-02-07 ENCOUNTER — Other Ambulatory Visit (HOSPITAL_BASED_OUTPATIENT_CLINIC_OR_DEPARTMENT_OTHER): Payer: Self-pay

## 2024-02-09 ENCOUNTER — Telehealth: Payer: Self-pay

## 2024-02-09 NOTE — Telephone Encounter (Signed)
 S/w patient's wife, Roseline, regarding after-hours message. Message noted that patient was experiencing vomiting and abdominal pain. According to wife, patient's symptoms have completely resolved. Patient had episode of n/v Friday evening and Saturday. Patient has not had another episode of emesis since Saturday at 5 PM. Patient is eating well and having normal bowel movements.  Patient is scheduled for f/u telephone visit with Dr. Loretha on 11/13.

## 2024-02-11 ENCOUNTER — Ambulatory Visit: Admitting: Dermatology

## 2024-02-11 ENCOUNTER — Other Ambulatory Visit: Payer: Self-pay

## 2024-02-12 ENCOUNTER — Inpatient Hospital Stay (HOSPITAL_BASED_OUTPATIENT_CLINIC_OR_DEPARTMENT_OTHER): Admitting: Hematology and Oncology

## 2024-02-12 ENCOUNTER — Telehealth: Payer: Self-pay | Admitting: *Deleted

## 2024-02-12 ENCOUNTER — Other Ambulatory Visit: Payer: Self-pay | Admitting: Hematology and Oncology

## 2024-02-12 DIAGNOSIS — Z8546 Personal history of malignant neoplasm of prostate: Secondary | ICD-10-CM

## 2024-02-12 DIAGNOSIS — R21 Rash and other nonspecific skin eruption: Secondary | ICD-10-CM

## 2024-02-12 DIAGNOSIS — Z85820 Personal history of malignant melanoma of skin: Secondary | ICD-10-CM | POA: Diagnosis not present

## 2024-02-12 DIAGNOSIS — C099 Malignant neoplasm of tonsil, unspecified: Secondary | ICD-10-CM

## 2024-02-12 DIAGNOSIS — Z5112 Encounter for antineoplastic immunotherapy: Secondary | ICD-10-CM

## 2024-02-12 DIAGNOSIS — Z79899 Other long term (current) drug therapy: Secondary | ICD-10-CM

## 2024-02-12 NOTE — Telephone Encounter (Signed)
 Called dermatology to request new appt for pt. Appt was moved to 12/29 at 0915. Advised that pt tx is on hold until he's seen by [facility]. Scheduler stated that she will f/u with pt once a sooner appt becomes available. Called pt wife to make aware. Pt wife, Roseline verbalized understanding.

## 2024-02-12 NOTE — Progress Notes (Signed)
 [facility] Cancer Follow up:    [name], DO [address] [place] [address] [place] [address]   DIAGNOSIS:  Cancer Staging  Cancer of tonsillar fossa (HCC) Staging form: Pharynx - HPV-Mediated Oropharynx, AJCC 8th Edition - Clinical stage from 05/08/2022: Stage II (cT3, cN1, cM0, p16+) - Signed by Izell Domino, MD on 05/08/2022 Stage prefix: Initial diagnosis   SUMMARY OF ONCOLOGIC HISTORY: Oncology History  Cancer of tonsillar fossa (HCC)  05/08/2022 Initial Diagnosis   Cancer of tonsillar fossa (HCC)   05/08/2022 Cancer Staging   Staging form: Pharynx - HPV-Mediated Oropharynx, AJCC 8th Edition - Clinical stage from 05/08/2022: Stage II (cT3, cN1, cM0, p16+) - Signed by Izell Domino, MD on 05/08/2022 Stage prefix: Initial diagnosis   06/05/2022 - 07/17/2022 Chemotherapy   Patient is on Treatment Plan : HEAD/NECK Cisplatin  (40) q7d     Melanoma of skin (HCC)  07/18/2023 Initial Diagnosis   Melanoma of skin (HCC)   07/28/2023 -  Chemotherapy   Patient is on Treatment Plan : MELANOMA Pembrolizumab  (200) q21d       CURRENT THERAPY: Concurrent chemoradiation  INTERVAL HISTORY:  History of Present Illness Filomeno Cromley is a 58 year old male who presents for telephone follow up of skin rash.  Discussed the use of AI scribe software for clinical note transcription with the patient, who gave verbal consent to proceed.  History of Present Illness  He is here for a telephone visit. Since his last visit here, he denies any worsening of the rash but it hasn't gotten particularly better. Its still on the scalp and chest wall. No rash or sores in his oral mucosa.He says his wife has talked to the dermatology office Currently his appt is scheduled for early January.  Rest of the pertinent 10 point ROS reviewed and neg.  Wt Readings from Last 3 Encounters:  02/03/24 262 lb 4.8 oz (119 kg)  01/13/24 298 lb 12.8 oz (135.5 kg)  01/13/24 280 lb 14.4 oz (127.4 kg)      Patient Active Problem List   Diagnosis Date Noted   Melanoma of skin (HCC) 07/18/2023   Port-A-Cath in place 06/04/2022   Cancer of tonsillar fossa (HCC) 05/08/2022   Prostate cancer (HCC) 02/07/2022   Medial epicondylitis of left elbow 03/08/2015   Obesity, Class II, BMI 35-39.9, with comorbidity 11/28/2010   Abdominal wall mass of epigastric region - old hernia sac/lipoma 11/28/2010   Abdominal pain, RLQ - muscle strain 11/28/2010    has no known allergies.  MEDICAL HISTORY: Past Medical History:  Diagnosis Date   Abdominal pain    Cancer (HCC)    prostate cancer, throat cancer, melanoma to back   Complication of anesthesia    slow to wake up 3 years ago after foot surgery   ED (erectile dysfunction)    Gout    Gout    History of kidney stones    Hypertension    Obesity, Class II, BMI 35-39.9, with comorbidity    Painful orthopaedic hardware    left foot    SURGICAL HISTORY: Past Surgical History:  Procedure Laterality Date   amputation of 2 toes on left foot      BREAST SURGERY     CARPAL TUNNEL RELEASE  05/03/2011   Procedure: CARPAL TUNNEL RELEASE;  Surgeon: Arley JONELLE Curia, MD;  Location: [facility];  Service: Orthopedics;  Laterality: Left;   FOOT FRACTURE SURGERY Left    x3   HARDWARE REMOVAL Left  06/03/2019   Procedure: HARDWARE REMOVAL;  Surgeon: Elsa Lonni SAUNDERS, MD;  Location: [facility];  Service: Orthopedics;  Laterality: Left;  PROCEDURE: LEFT FOOT REMOVAL OF HARDWARE, IRRIGATION AND DEBRIDEMENT,PLACEMENT OF CEMENT SPACER, DEBULKIN, SOFT TISSUE MASS LENGTH OF SURGERY: 2 HOURS   HERNIA REPAIR  02/06/2009   Lap supraumb & umb VWH repairs   INCISION AND DRAINAGE OF WOUND Left 06/03/2019   Procedure: IRRIGATION AND DEBRIDEMENT WOUND WITH IMPLANTATION OF ANTIBIOTIC CEMENT SPACER;  Surgeon: Elsa Lonni SAUNDERS, MD;  Location: [facility];  Service: Orthopedics;  Laterality: Left;   IR GASTROSTOMY TUBE MOD  SED  05/29/2022   IR GASTROSTOMY TUBE REMOVAL  08/28/2022   IR IMAGING GUIDED PORT INSERTION  05/29/2022   IR REMOVAL TUN ACCESS W/ PORT W/O FL MOD SED  11/04/2022   left rotator cuff surgery      LYMPHADENECTOMY Bilateral 02/07/2022   Procedure: BILATERAL PELVIC LYMPHADENECTOMY;  Surgeon: Renda Glance, MD;  Location: [facility];  Service: Urology;  Laterality: Bilateral;   MASS EXCISION Left 06/03/2019   Procedure: DEBULKING LEFT FOOT MASS;  Surgeon: Elsa Lonni SAUNDERS, MD;  Location:  SURGERY CENTER;  Service: Orthopedics;  Laterality: Left;   MELANOMA EXCISION N/A 06/19/2023   Procedure: EXCISION, MELANOMA;  Surgeon: Aron Shoulders, MD;  Location: [facility];  Service: General;  Laterality: N/A;  WIDE LOCAL EXCISION BACK MELANOMA WITH ADVANCEMENT FLAP CLOSURE WITH SENTINEL NODE BIOPSY   ROBOT ASSISTED LAPAROSCOPIC RADICAL PROSTATECTOMY N/A 02/07/2022   Procedure: XI ROBOTIC ASSISTED LAPAROSCOPIC RADICAL PROSTATECTOMY LEVEL 3;  Surgeon: Renda Glance, MD;  Location: [facility];  Service: Urology;  Laterality: N/A;  210 MINUTES NEEDED FOR CASE   SENTINEL NODE BIOPSY Left 06/19/2023   Procedure: BIOPSY, LYMPH NODE, SENTINEL;  Surgeon: Aron Shoulders, MD;  Location: [facility];  Service: General;  Laterality: Left;    SOCIAL HISTORY: Social History   Socioeconomic History   Marital status: Married    Spouse name: Not on file   Number of children: Not on file   Years of education: Not on file   Highest education level: Not on file  Occupational History   Not on file  Tobacco Use   Smoking status: Never   Smokeless tobacco: Never  Vaping Use   Vaping status: Never Used  Substance and Sexual Activity   Alcohol use: No   Drug use: No   Sexual activity: Not on file  Other Topics Concern   Not on file  Social History Narrative   Not on file   Social Drivers of Health   Financial Resource Strain: Not on file  Food Insecurity: No Food Insecurity (02/07/2022)   Hunger Vital Sign     Worried About Running Out of Food in the Last Year: Never true    Ran Out of Food in the Last Year: Never true  Transportation Needs: No Transportation Needs (02/07/2022)   PRAPARE - Administrator, Civil Service (Medical): No    Lack of Transportation (Non-Medical): No  Physical Activity: Not on file  Stress: Not on file  Social Connections: Unknown (05/01/2022)   Received from [facility]   Social Network    Social Network: Not on file  Intimate Partner Violence: Unknown (05/01/2022)   Received from [facility]   HITS    Physically Hurt: Not on file    Insult or Talk Down To: Not on file    Threaten Physical Harm: Not on file    Scream or  Curse: Not on file    FAMILY HISTORY: Family History  Problem Relation Age of Onset   Diabetes Mother    Diabetes Father     PHYSICAL EXAMINATION  There were no vitals taken for this visit.   There were no vitals filed for this visit.    PE not done, telephone visit.   LABORATORY DATA:  CBC    Component Value Date/Time   WBC 7.3 02/03/2024 0838   WBC 6.0 06/13/2023 0835   RBC 5.00 02/03/2024 0838   HGB 14.6 02/03/2024 0838   HCT 43.3 02/03/2024 0838   PLT 210 02/03/2024 0838   MCV 86.6 02/03/2024 0838   MCH 29.2 02/03/2024 0838   MCHC 33.7 02/03/2024 0838   RDW 14.4 02/03/2024 0838   LYMPHSABS 0.7 02/03/2024 0838   MONOABS 0.7 02/03/2024 0838   EOSABS 0.3 02/03/2024 0838   BASOSABS 0.0 02/03/2024 0838    CMP     Component Value Date/Time   NA 141 02/03/2024 0838   K 3.8 02/03/2024 0838   CL 108 02/03/2024 0838   CO2 28 02/03/2024 0838   GLUCOSE 97 02/03/2024 0838   BUN 17 02/03/2024 0838   CREATININE 1.35 (H) 02/03/2024 0838   CALCIUM 9.2 02/03/2024 0838   PROT 7.1 02/03/2024 0838   ALBUMIN 4.2 02/03/2024 0838   AST 20 02/03/2024 0838   ALT 20 02/03/2024 0838   ALKPHOS 102 02/03/2024 0838   BILITOT 0.8 02/03/2024 0838   GFRNONAA >60 02/03/2024 0838   GFRAA >60 10/21/2017 2005      ASSESSMENT & PLAN:  This is a very pleasant 58 year old male patient with 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 He had a chronic mole for the past 20 years but has noticed some change in it in the past 6 months, the mole started growing and hence went to see his dermatologist, had an incision and a biopsy which showed malignant melanoma, Clark's level 4, all margins involved at least staged as a pT4a now back to medical oncology for additional recommendations.  PET CT neg for metastatic disease. He had surgical excision from left back, residual melanoma in situ, no residual invasive disease, 1/6 SLN pos for melanoma, final path staging PT4aN1.  We will plan to send BRAF mutation on the surgical pathology specimen.  He is on adj Keytruda    Assessment and Plan Assessment & Plan  Immunotherapy-induced rash and pruritus Rash and pruritus worsened with moderate dermatological toxicity. Immunotherapy held. - Prescribed Kenalog lotion twice daily to scalp, chest wall, and back. - Hold immunotherapy until rash improves. - It appears derm team called him and offered a sooner appointment but he might not have accepted it. He is currently scheduled for January. - He said the rash hasn't gotten any worse or better - I encouraged him to contact the dermatology office Dr Corey again and request for a sooner appointment.  - He will come to see me as scheduled. He knows to call us  with any worsening of rash.  Time spent: 20 min  I connected with  Reyes Dukes on 02/12/24 by a telephone application and verified that I am speaking with the correct person using two identifiers.   I discussed the limitations of evaluation and management by telemedicine. The patient expressed understanding and agreed to proceed.  Location  of pt: home Location  of provider: office.  *Total Encounter Time as defined by the Centers for Medicare and Medicaid Services includes, in addition to the face-to-face time of a patient visit (documented in the note above) non-face-to-face time: obtaining and reviewing outside history, ordering and reviewing medications, tests or procedures, care coordination (communications with other health care professionals or caregivers) and documentation in the medical record.

## 2024-02-19 ENCOUNTER — Ambulatory Visit: Admitting: Dermatology

## 2024-02-23 ENCOUNTER — Other Ambulatory Visit: Payer: Self-pay

## 2024-02-23 DIAGNOSIS — C4359 Malignant melanoma of other part of trunk: Secondary | ICD-10-CM

## 2024-02-24 ENCOUNTER — Inpatient Hospital Stay

## 2024-02-24 ENCOUNTER — Telehealth: Payer: Self-pay | Admitting: Hematology and Oncology

## 2024-02-24 ENCOUNTER — Telehealth: Payer: Self-pay

## 2024-02-24 ENCOUNTER — Inpatient Hospital Stay: Admitting: Hematology and Oncology

## 2024-02-24 NOTE — Telephone Encounter (Signed)
 Attempted to call pt as he is a no show to labs, MD and infusion. LVM for call back to r/s.

## 2024-02-24 NOTE — Telephone Encounter (Signed)
 I left a voicemail for this patient due to his missed treatment on 02/24/2024. I rescheduled him for 02/27/2024 and advised him to return my call if date/time does not work for him.

## 2024-02-27 ENCOUNTER — Inpatient Hospital Stay: Admitting: Hematology and Oncology

## 2024-02-27 ENCOUNTER — Inpatient Hospital Stay

## 2024-03-12 ENCOUNTER — Other Ambulatory Visit: Payer: Self-pay

## 2024-03-15 ENCOUNTER — Telehealth: Payer: Self-pay

## 2024-03-15 NOTE — Telephone Encounter (Signed)
S/w pts

## 2024-03-16 ENCOUNTER — Inpatient Hospital Stay

## 2024-03-16 ENCOUNTER — Other Ambulatory Visit (HOSPITAL_BASED_OUTPATIENT_CLINIC_OR_DEPARTMENT_OTHER): Payer: Self-pay

## 2024-03-16 ENCOUNTER — Inpatient Hospital Stay: Attending: Hematology and Oncology

## 2024-03-16 ENCOUNTER — Inpatient Hospital Stay: Admitting: Hematology and Oncology

## 2024-03-16 ENCOUNTER — Inpatient Hospital Stay: Admitting: Adult Health

## 2024-03-16 ENCOUNTER — Ambulatory Visit: Admitting: Physician Assistant

## 2024-03-16 VITALS — BP 168/98 | HR 94 | Temp 98.5°F | Resp 18 | Wt 284.2 lb

## 2024-03-16 DIAGNOSIS — C439 Malignant melanoma of skin, unspecified: Secondary | ICD-10-CM

## 2024-03-16 DIAGNOSIS — C4359 Malignant melanoma of other part of trunk: Secondary | ICD-10-CM | POA: Insufficient documentation

## 2024-03-16 DIAGNOSIS — C090 Malignant neoplasm of tonsillar fossa: Secondary | ICD-10-CM | POA: Diagnosis present

## 2024-03-16 LAB — CMP (CANCER CENTER ONLY)
ALT: 17 U/L (ref 0–44)
AST: 22 U/L (ref 15–41)
Albumin: 4.1 g/dL (ref 3.5–5.0)
Alkaline Phosphatase: 116 U/L (ref 38–126)
Anion gap: 9 (ref 5–15)
BUN: 21 mg/dL — ABNORMAL HIGH (ref 6–20)
CO2: 27 mmol/L (ref 22–32)
Calcium: 9.2 mg/dL (ref 8.9–10.3)
Chloride: 105 mmol/L (ref 98–111)
Creatinine: 1.43 mg/dL — ABNORMAL HIGH (ref 0.61–1.24)
GFR, Estimated: 56 mL/min — ABNORMAL LOW (ref >60)
Glucose, Bld: 122 mg/dL — ABNORMAL HIGH (ref 70–99)
Potassium: 4.1 mmol/L (ref 3.5–5.1)
Sodium: 141 mmol/L (ref 135–145)
Total Bilirubin: 0.8 mg/dL (ref 0.0–1.2)
Total Protein: 7.1 g/dL (ref 6.5–8.1)

## 2024-03-16 LAB — CBC WITH DIFFERENTIAL (CANCER CENTER ONLY)
Abs Immature Granulocytes: 0.02 K/uL (ref 0.00–0.07)
Basophils Absolute: 0.1 K/uL (ref 0.0–0.1)
Basophils Relative: 1 %
Eosinophils Absolute: 0.2 K/uL (ref 0.0–0.5)
Eosinophils Relative: 3 %
HCT: 43 % (ref 39.0–52.0)
Hemoglobin: 14.3 g/dL (ref 13.0–17.0)
Immature Granulocytes: 0 %
Lymphocytes Relative: 7 %
Lymphs Abs: 0.5 K/uL — ABNORMAL LOW (ref 0.7–4.0)
MCH: 28.8 pg (ref 26.0–34.0)
MCHC: 33.3 g/dL (ref 30.0–36.0)
MCV: 86.5 fL (ref 80.0–100.0)
Monocytes Absolute: 0.7 K/uL (ref 0.1–1.0)
Monocytes Relative: 8 %
Neutro Abs: 6.5 K/uL (ref 1.7–7.7)
Neutrophils Relative %: 81 %
Platelet Count: 200 K/uL (ref 150–400)
RBC: 4.97 MIL/uL (ref 4.22–5.81)
RDW: 14.1 % (ref 11.5–15.5)
WBC Count: 8 K/uL (ref 4.0–10.5)
nRBC: 0 % (ref 0.0–0.2)

## 2024-03-16 MED ORDER — PREDNISONE 10 MG PO TABS
10.0000 mg | ORAL_TABLET | Freq: Every day | ORAL | 90 refills | Status: DC
  Filled 2024-03-16: qty 30, 30d supply, fill #0

## 2024-03-16 NOTE — Progress Notes (Signed)
 [facility] Cancer Follow up:    [name], DO [address] [place] [address] [place] [address]   DIAGNOSIS:  Cancer Staging  Cancer of tonsillar fossa (HCC) Staging form: Pharynx - HPV-Mediated Oropharynx, AJCC 8th Edition - Clinical stage from 05/08/2022: Stage II (cT3, cN1, cM0, p16+) - Signed by Izell Domino, MD on 05/08/2022 Stage prefix: Initial diagnosis   SUMMARY OF ONCOLOGIC HISTORY: Oncology History  Cancer of tonsillar fossa (HCC)  05/08/2022 Initial Diagnosis   Cancer of tonsillar fossa (HCC)   05/08/2022 Cancer Staging   Staging form: Pharynx - HPV-Mediated Oropharynx, AJCC 8th Edition - Clinical stage from 05/08/2022: Stage II (cT3, cN1, cM0, p16+) - Signed by Izell Domino, MD on 05/08/2022 Stage prefix: Initial diagnosis   06/05/2022 - 07/17/2022 Chemotherapy   Patient is on Treatment Plan : HEAD/NECK Cisplatin  (40) q7d     Melanoma of skin (HCC)  07/18/2023 Initial Diagnosis   Melanoma of skin (HCC)   07/28/2023 -  Chemotherapy   Patient is on Treatment Plan : MELANOMA Pembrolizumab  (200) q21d       CURRENT THERAPY: Concurrent chemoradiation  INTERVAL HISTORY:  History of Present Illness Aaron Moore is a 58 year old male who presents for telephone follow up of skin rash.  Discussed the use of AI scribe software for clinical note transcription with the patient, who gave verbal consent to proceed.  Aaron Moore is a 58 year old male with metastatic malignant melanoma on immunotherapy (currently on hold) who presents with a persistent, spreading rash.  He developed a persistent, spreading rash following immunotherapy, initially involving the scalp with significant dryness and irritation behind the ears, and subsequently extending to the chest, umbilicus, legs, axillary folds, and a single area on the back. The periumbilical region became erythematous and irritated, which was new. The rash fluctuated in severity, with temporary improvement followed  by worsening. He has used topical triamcinolone  extensively with minimal relief and applies Nizoral shampoo to the scalp. The rash is described as dry and bothersome, with some improvement on the face but new involvement below the eyes and on the chest. The rash has spread to additional areas, including flexural surfaces and the umbilicus.  He experienced episodes of profuse diaphoresis and emesis beginning approximately four days after his last oncology visit; these were intermittent and have since resolved. He was unable to attend a previously scheduled dermatology appointment due to illness, and subsequent appointments were rescheduled or canceled, resulting in no current dermatology follow-up. There is no family history of psoriasis.  He endorses significant fatigue and frustration with ongoing medical issues and frequent changes in appointments, describing himself as wore down with diminished motivation. The rash is aggravating and impacts daily activities. He expresses emotional distress related to the burden of frequent medical visits and interruptions in care.  He experiences headaches two to three times per week, present since starting immunotherapy, sometimes accompanied by nausea. Headaches have increased in intensity recently. He does not hydrate well and rarely drinks plain water . No other new symptoms were reported.   Wt Readings from Last 3 Encounters:  03/16/24 284 lb 3.2 oz (128.9 kg)  02/03/24 262 lb 4.8 oz (119 kg)  01/13/24 298 lb 12.8 oz (135.5 kg)     Patient Active Problem List   Diagnosis Date Noted   Melanoma of skin (HCC) 07/18/2023   Port-A-Cath in place 06/04/2022   Cancer of tonsillar fossa (HCC) 05/08/2022   Prostate cancer (HCC) 02/07/2022   Medial epicondylitis of left elbow  03/08/2015   Obesity, Class II, BMI 35-39.9, with comorbidity 11/28/2010   Abdominal wall mass of epigastric region - old hernia sac/lipoma 11/28/2010   Abdominal pain, RLQ - muscle  strain 11/28/2010    has no known allergies.  MEDICAL HISTORY: Past Medical History:  Diagnosis Date   Abdominal pain    Cancer (HCC)    prostate cancer, throat cancer, melanoma to back   Complication of anesthesia    slow to wake up 3 years ago after foot surgery   ED (erectile dysfunction)    Gout    Gout    History of kidney stones    Hypertension    Obesity, Class II, BMI 35-39.9, with comorbidity    Painful orthopaedic hardware    left foot    SURGICAL HISTORY: Past Surgical History:  Procedure Laterality Date   amputation of 2 toes on left foot      BREAST SURGERY     CARPAL TUNNEL RELEASE  05/03/2011   Procedure: CARPAL TUNNEL RELEASE;  Surgeon: Arley JONELLE Curia, MD;  Location: [facility];  Service: Orthopedics;  Laterality: Left;   FOOT FRACTURE SURGERY Left    x3   HARDWARE REMOVAL Left 06/03/2019   Procedure: HARDWARE REMOVAL;  Surgeon: Elsa Lonni JONELLE, MD;  Location: [facility];  Service: Orthopedics;  Laterality: Left;  PROCEDURE: LEFT FOOT REMOVAL OF HARDWARE, IRRIGATION AND DEBRIDEMENT,PLACEMENT OF CEMENT SPACER, DEBULKIN, SOFT TISSUE MASS LENGTH OF SURGERY: 2 HOURS   HERNIA REPAIR  02/06/2009   Lap supraumb & umb VWH repairs   INCISION AND DRAINAGE OF WOUND Left 06/03/2019   Procedure: IRRIGATION AND DEBRIDEMENT WOUND WITH IMPLANTATION OF ANTIBIOTIC CEMENT SPACER;  Surgeon: Elsa Lonni JONELLE, MD;  Location: [facility];  Service: Orthopedics;  Laterality: Left;   IR GASTROSTOMY TUBE MOD SED  05/29/2022   IR GASTROSTOMY TUBE REMOVAL  08/28/2022   IR IMAGING GUIDED PORT INSERTION  05/29/2022   IR REMOVAL TUN ACCESS W/ PORT W/O FL MOD SED  11/04/2022   left rotator cuff surgery      LYMPHADENECTOMY Bilateral 02/07/2022   Procedure: BILATERAL PELVIC LYMPHADENECTOMY;  Surgeon: Renda Glance, MD;  Location: [facility];  Service: Urology;  Laterality: Bilateral;   MASS EXCISION Left 06/03/2019   Procedure: DEBULKING  LEFT FOOT MASS;  Surgeon: Elsa Lonni JONELLE, MD;  Location: [facility];  Service: Orthopedics;  Laterality: Left;   MELANOMA EXCISION N/A 06/19/2023   Procedure: EXCISION, MELANOMA;  Surgeon: Aron Shoulders, MD;  Location: [facility];  Service: General;  Laterality: N/A;  WIDE LOCAL EXCISION BACK MELANOMA WITH ADVANCEMENT FLAP CLOSURE WITH SENTINEL NODE BIOPSY   ROBOT ASSISTED LAPAROSCOPIC RADICAL PROSTATECTOMY N/A 02/07/2022   Procedure: XI ROBOTIC ASSISTED LAPAROSCOPIC RADICAL PROSTATECTOMY LEVEL 3;  Surgeon: Renda Glance, MD;  Location: [facility];  Service: Urology;  Laterality: N/A;  210 MINUTES NEEDED FOR CASE   SENTINEL NODE BIOPSY Left 06/19/2023   Procedure: BIOPSY, LYMPH NODE, SENTINEL;  Surgeon: Aron Shoulders, MD;  Location: [facility];  Service: General;  Laterality: Left;    SOCIAL HISTORY: Social History   Socioeconomic History   Marital status: Married    Spouse name: Not on file   Number of children: Not on file   Years of education: Not on file   Highest education level: Not on file  Occupational History   Not on file  Tobacco Use   Smoking status: Never   Smokeless tobacco: Never  Vaping Use   Vaping  status: Never Used  Substance and Sexual Activity   Alcohol use: No   Drug use: No   Sexual activity: Not on file  Other Topics Concern   Not on file  Social History Narrative   Not on file   Social Drivers of Health   Tobacco Use: Low Risk (07/18/2023)   Patient History    Smoking Tobacco Use: Never    Smokeless Tobacco Use: Never    Passive Exposure: Not on file  Financial Resource Strain: Not on file  Food Insecurity: No Food Insecurity (02/07/2022)   Hunger Vital Sign    Worried About Running Out of Food in the Last Year: Never true    Ran Out of Food in the Last Year: Never true  Transportation Needs: No Transportation Needs (02/07/2022)   PRAPARE - Administrator, Civil Service (Medical): No    Lack of Transportation (Non-Medical): No   Physical Activity: Not on file  Stress: Not on file  Social Connections: Unknown (05/01/2022)   [facility] from [facility]   Social Network    Social Network: Not on file  Intimate Partner Violence: Unknown (05/01/2022)   Received from [facility]   HITS    Physically Hurt: Not on file    Insult or Talk Down To: Not on file    Threaten Physical Harm: Not on file    Scream or Curse: Not on file  Depression (PHQ2-9): Low Risk (01/13/2024)   Depression (PHQ2-9)    PHQ-2 Score: 0  Alcohol Screen: Not on file  Housing: Unknown (05/30/2023)   Received from [facility]   Epic    Unable to Pay for Housing in the Last Year: Not on file    Number of Times Moved in the Last Year: Not on file    At any time in the past 12 months, were you homeless or living in a shelter (including now)?: No  Utilities: Not At Risk (02/07/2022)   [facility] Utilities    Threatened with loss of utilities: No  Health Literacy: Not on file    FAMILY HISTORY: Family History  Problem Relation Age of Onset   Diabetes Mother    Diabetes Father     PHYSICAL EXAMINATION  BP (!) 168/98 (BP Location: Left Arm, Patient Position: Sitting) Comment: patient is upset about his treatment  Pulse 94   Temp 98.5 F (36.9 C) (Temporal)   Resp 18   Wt 284 lb 3.2 oz (128.9 kg)   SpO2 100%   BMI 37.50 kg/m    Vitals:   03/16/24 0931  BP: (!) 168/98  Pulse: 94  Resp: 18  Temp: 98.5 F (36.9 C)  SpO2: 100%    He appears well Rash on the face improved but new rash noted around eye brows, umbilicus, ant chest wall, behind the knees He didn't go to his derm appt yet.    LABORATORY DATA:  CBC    Component Value Date/Time   WBC 8.0 03/16/2024 0908   WBC 6.0 06/13/2023 0835   RBC 4.97 03/16/2024 0908   HGB 14.3 03/16/2024 0908   HCT 43.0 03/16/2024 0908   PLT 200 03/16/2024 0908   MCV 86.5 03/16/2024 0908   MCH 28.8 03/16/2024 0908   MCHC 33.3 03/16/2024 0908   RDW 14.1 03/16/2024 0908    LYMPHSABS 0.5 (L) 03/16/2024 0908   MONOABS 0.7 03/16/2024 0908   EOSABS 0.2 03/16/2024 0908   BASOSABS 0.1 03/16/2024 0908    CMP  Component Value Date/Time   NA 141 03/16/2024 0908   K 4.1 03/16/2024 0908   CL 105 03/16/2024 0908   CO2 27 03/16/2024 0908   GLUCOSE 122 (H) 03/16/2024 0908   BUN 21 (H) 03/16/2024 0908   CREATININE 1.43 (H) 03/16/2024 0908   CALCIUM 9.2 03/16/2024 0908   PROT 7.1 03/16/2024 0908   ALBUMIN 4.1 03/16/2024 0908   AST 22 03/16/2024 0908   ALT 17 03/16/2024 0908   ALKPHOS 116 03/16/2024 0908   BILITOT 0.8 03/16/2024 0908   GFRNONAA 56 (L) 03/16/2024 0908   GFRAA >60 10/21/2017 2005     ASSESSMENT & PLAN:  This is a very pleasant 58 year old male patient with 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 He had a chronic mole for the past 20 years but has noticed some change in it in the past 6 months, the mole started growing and hence went to see his dermatologist, had an incision and a biopsy which showed malignant melanoma, Clark's level 4, all margins involved at least staged as a pT4a now back to medical oncology for additional recommendations.  PET CT neg for metastatic disease. He had surgical excision from left back, residual melanoma in situ, no residual invasive disease, 1/6 SLN pos for melanoma, final path staging PT4aN1.  We will plan to send BRAF mutation on the surgical pathology specimen.  He is on adj Keytruda    Assessment and Plan Assessment & Plan  Malignant melanoma of skin Under surveillance during immunotherapy hold. Immunotherapy hold due to dermatitis.  - Plan to resume immunotherapy pending dermatology clearance and dermatitis resolution. - Repeat PET and MRI brain ordered.  Immunotherapy-induced dermatitis Grade 1-2 dermatitis with rash  despite topical steroids. Requires dermatology evaluation before resuming immunotherapy. Will start him on systemic steroids - Prescribed prednisone  taper: 4 tablets daily for 1 week, then 3 tablets daily for 1 week, then 2 tablets daily for 1 week, then 1 tablet daily for 1 week, then discontinue. - Instructed to take prednisone  in the morning after breakfast. - Recommended continued use of Nizoral shampoo for scalp involvement. - Requested dermatology evaluation prior to immunotherapy resumption. - Instructed family to contact dermatology for appointment scheduling. - Plan to follow up after one week of prednisone  to assess rash improvement. - Hold immunotherapy until dermatology clearance and rash resolution.  Fatigue and headache secondary to malignancy and treatment Fatigue and headaches ongoing, likely multifactorial. Suboptimal hydration may contribute. - Encouraged increased oral hydration. - Continued routine blood work to evaluate for metabolic or treatment-related etiologies.  Time spent: 30 min   *Total Encounter Time as defined by the Centers for Medicare and Medicaid Services includes, in addition to the face-to-face time of a patient visit (documented in the note above) non-face-to-face time: obtaining and reviewing outside history, ordering and reviewing medications, tests or procedures, care coordination (communications with other health care professionals or caregivers) and documentation in the medical record.

## 2024-03-17 ENCOUNTER — Telehealth: Payer: Self-pay | Admitting: Hematology and Oncology

## 2024-03-17 NOTE — Telephone Encounter (Signed)
I left voicemail for patient regarding 04/27/2024 appointment.

## 2024-03-29 ENCOUNTER — Ambulatory Visit: Admitting: Dermatology

## 2024-03-30 ENCOUNTER — Other Ambulatory Visit: Payer: Self-pay | Admitting: Hematology and Oncology

## 2024-04-02 ENCOUNTER — Encounter (HOSPITAL_COMMUNITY): Payer: Self-pay

## 2024-04-02 ENCOUNTER — Other Ambulatory Visit: Payer: Self-pay

## 2024-04-02 ENCOUNTER — Emergency Department (HOSPITAL_COMMUNITY)

## 2024-04-02 ENCOUNTER — Emergency Department (HOSPITAL_COMMUNITY)
Admission: EM | Admit: 2024-04-02 | Discharge: 2024-05-02 | Disposition: E | Source: Home / Self Care | Attending: Emergency Medicine | Admitting: Emergency Medicine

## 2024-04-02 DIAGNOSIS — I10 Essential (primary) hypertension: Secondary | ICD-10-CM | POA: Insufficient documentation

## 2024-04-02 DIAGNOSIS — R109 Unspecified abdominal pain: Secondary | ICD-10-CM | POA: Insufficient documentation

## 2024-04-02 DIAGNOSIS — R112 Nausea with vomiting, unspecified: Secondary | ICD-10-CM | POA: Diagnosis not present

## 2024-04-02 DIAGNOSIS — I469 Cardiac arrest, cause unspecified: Secondary | ICD-10-CM | POA: Insufficient documentation

## 2024-04-02 DIAGNOSIS — R519 Headache, unspecified: Secondary | ICD-10-CM | POA: Insufficient documentation

## 2024-04-02 DIAGNOSIS — Z79899 Other long term (current) drug therapy: Secondary | ICD-10-CM | POA: Diagnosis not present

## 2024-04-02 MED ORDER — MORPHINE SULFATE (PF) 4 MG/ML IV SOLN: 4.0000 mg | Freq: Once | INTRAVENOUS | Status: DC

## 2024-04-02 MED ORDER — METHYLPREDNISOLONE SODIUM SUCC 125 MG IJ SOLR
125.0000 mg | Freq: Once | INTRAMUSCULAR | Status: AC
  Administered 2024-04-02: 125 mg via INTRAVENOUS

## 2024-04-02 MED ORDER — DROPERIDOL 2.5 MG/ML IJ SOLN: 1.2500 mg | Freq: Once | INTRAMUSCULAR | Status: DC

## 2024-04-02 MED ORDER — EPINEPHRINE 1 MG/10ML IV SOSY
PREFILLED_SYRINGE | INTRAVENOUS | Status: AC | PRN
  Administered 2024-04-02 (×7): 1 mg via INTRAVENOUS

## 2024-04-02 MED ORDER — MAGNESIUM SULFATE 50 % IJ SOLN
INTRAMUSCULAR | Status: AC | PRN
  Administered 2024-04-02: 2 g via INTRAVENOUS

## 2024-04-02 MED ORDER — CALCIUM CHLORIDE 10 % IV SOLN
INTRAVENOUS | Status: AC | PRN
  Administered 2024-04-02: 1 g via INTRAVENOUS

## 2024-04-02 MED ORDER — LACTATED RINGERS IV BOLUS: 1000.0000 mL | Freq: Once | INTRAVENOUS | Status: DC

## 2024-04-07 ENCOUNTER — Inpatient Hospital Stay: Admitting: Hematology and Oncology

## 2024-04-12 ENCOUNTER — Ambulatory Visit: Admitting: Dermatology

## 2024-05-02 NOTE — ED Triage Notes (Addendum)
 Patient bib [facility] from home with complaints of abdominal pain, back pain, right sided pain. He is also hypertensive and vomiting. EMS reports that his wife told them he had a aneurysm  in his brain somewhere. He has not taken his bp medicine in a couple days. EMS reports that patient started having the pain around midnight. He vomited on transfer to wheelchair and again once placed in triage room. On arrival to ED patient is A&Ox4 and ambulatory to wheelchair.

## 2024-05-02 NOTE — ED Notes (Signed)
 Patient arrived to room from hallway, Code Blue in progress and ongoing CPR. BVM for ventilation

## 2024-05-02 NOTE — ED Notes (Signed)
 Pt moved to hallway bed, ED Provider at bedside to see pt, as she walked away pt rolled out of the bed and hit the floor. Multiple staff to bedside to assist pt back into bed, pt apneic, pulseless upon assessment and CPR started immediately. Pt transferred to trauma B for further care.

## 2024-05-02 NOTE — ED Provider Notes (Signed)
" [place] EMERGENCY DEPARTMENT AT [facility] Provider Note   CSN: [id number] Arrival date & time: 04/08/2024  9268     Patient presents with: Hypertension and Flank Pain (/)   Aaron Moore is a 59 y.o. male.   Patient is a 59 year old male with a past medical history of melanoma on immunotherapy and hypertension presenting to the emergency department with abdominal pain, nausea and vomiting.  Per EMS, the patient reported starting to have pain around midnight last night.  The patient states that his pain is in his right flank to mid back and in his abdomen.  He states he has been nauseous and vomiting.  He states he does have a mild headache.  Further history was limited due to patient's critical acuity.  Of note, EMS reported a history of a possible aneurysm however patient's wife reports that he has never been diagnosed with an aneurysm and that she can more see a bulge on his forehead sometimes when he is having a headache.  He also reportedly had not taken his blood pressure medication in a couple of days.  The history is provided by the patient and the EMS personnel.  Hypertension  Flank Pain       Prior to Admission medications  Medication Sig Start Date End Date Taking? Authorizing Provider  allopurinol (ZYLOPRIM) 100 MG tablet Take 100 mg by mouth daily. 12/26/22   [provider]  amLODipine -valsartan  (EXFORGE ) 10-320 MG tablet Take 1 tablet by mouth daily. 12/25/22   [provider]  metoprolol succinate (TOPROL-XL) 50 MG 24 hr tablet Take 50 mg by mouth daily. 06/23/22   [provider]  predniSONE  (DELTASONE ) 10 MG tablet Take 1 tablet (10 mg total) by mouth daily with breakfast. 03/16/24   Iruku, Praveena, MD  triamcinolone  lotion (KENALOG ) 0.1 % APPLY TO AFFECTED AREA TWICE A DAY 03/30/24   Iruku, Praveena, MD    Allergies: Patient has no known allergies.    Review of Systems  Genitourinary:  Positive for flank pain.    Updated  Vital Signs BP (!) 174/127 (BP Location: Right Leg)   Pulse (!) 58   Temp 98.3 F (36.8 C) (Oral)   Resp (!) 22   Ht 6' 1 (1.854 m)   Wt 127.9 kg   SpO2 (!) 83%   BMI 37.21 kg/m   Physical Exam Vitals and nursing note reviewed.  Constitutional:      Appearance: He is obese. He is ill-appearing and diaphoretic.  HENT:     Head: Normocephalic and atraumatic.     Nose: Nose normal.     Mouth/Throat:     Mouth: Mucous membranes are moist.  Eyes:     Extraocular Movements: Extraocular movements intact.  Cardiovascular:     Rate and Rhythm: Normal rate and regular rhythm.     Heart sounds: Normal heart sounds.  Pulmonary:     Effort: Pulmonary effort is normal.     Breath sounds: Normal breath sounds.  Abdominal:     General: Abdomen is flat.     Palpations: Abdomen is soft.     Tenderness: There is abdominal tenderness (diffuse). There is right CVA tenderness. There is no left CVA tenderness, guarding or rebound.  Musculoskeletal:        General: Normal range of motion.     Cervical back: Normal range of motion.  Skin:    General: Skin is warm.  Neurological:     General: No focal deficit present.  Mental Status: He is alert and oriented to person, place, and time.     (all labs ordered are listed, but only abnormal results are displayed) Labs Reviewed  COMPREHENSIVE METABOLIC PANEL WITH GFR  CBC WITH DIFFERENTIAL/PLATELET  LIPASE, BLOOD  URINALYSIS, ROUTINE W REFLEX MICROSCOPIC    EKG: None  Radiology: No results found.   Procedure Name: Intubation Date/Time: 04/24/2024 8:24 AM  Performed by: Ellouise Richerd POUR, DOPre-anesthesia Checklist: Patient identified, Patient being monitored, Suction available and Emergency Drugs available Oxygen Delivery Method: Ambu bag Preoxygenation: Intubated during Code, pulse ox monitor reading in the 60's prior to intubation. Ventilation: Mask ventilation without difficulty Laryngoscope Size: Glidescope and 4 Grade  View: Grade I Tube size: 8.0 mm Number of attempts: 1 Airway Equipment and Method: Rigid stylet and Video-laryngoscopy Placement Confirmation: ETT inserted through vocal cords under direct vision and CO2 detector Secured at: 23 cm Tube secured with: ETT holder Dental Injury: Teeth and Oropharynx as per pre-operative assessment     .Critical Care  Performed by: Kingsley, Alsie Younes K, DO Authorized by: Ellouise Richerd POUR, DO   Critical care provider statement:    Critical care time (minutes):  30   Critical care was time spent personally by me on the following activities:  Development of treatment plan with patient or surrogate, discussions with consultants, evaluation of patient's response to treatment, examination of patient, ordering and review of laboratory studies, ordering and review of radiographic studies, ordering and performing treatments and interventions, pulse oximetry, re-evaluation of patient's condition and review of old charts    Medications Ordered in the ED  droperidol (INAPSINE) 2.5 MG/ML injection 1.25 mg (1.25 mg Intravenous Not Given 04/14/2024 0745)  morphine  (PF) 4 MG/ML injection 4 mg (4 mg Intravenous Not Given 04/27/2024 0745)  lactated ringers  bolus 1,000 mL (1,000 mLs Intravenous Not Given 04/29/2024 0745)  EPINEPHrine  (ADRENALIN ) 1 MG/10ML injection (1 mg Intravenous Given 04/08/2024 0809)  calcium chloride injection (1 g Intravenous Given 04/03/2024 0758)  magnesium  sulfate (IV Push/IM) injection (2 g Intravenous Given 04/10/2024 0758)  methylPREDNISolone  sodium succinate (SOLU-MEDROL ) 125 mg/2 mL injection 125 mg (125 mg Intravenous Given 04/28/2024 0753)    Clinical Course as of 04/04/2024 0839  Fri Apr 02, 2024  0823 I spoke [facility] medical examiner - they will review the patient and call back to see if he will be an ME case.  [VK]  [id number] ME declined the case as he was receiving active cancer treatment.  [VK]    Clinical Course User Index [VK] Kingsley, Kendyl Bissonnette K, DO                                  Medical Decision Making This patient presents to the ED with chief complaint(s) of abdominal pain, N/V with pertinent past medical history of melanoma on immunotherapy, HTN which further complicates the presenting complaint. The complaint involves an extensive differential diagnosis and also carries with it a high risk of complications and morbidity.    The differential diagnosis includes nephrolithiasis, pyelonephritis, gastroenteritis, GERD, pancreatitis, hepatitis, AAA, dissection, ICH, mass effect, electrolyte derangement, hypo or hyperglycemia  Additional history obtained: Additional history obtained from spouse and EMS  Records reviewed outpatient oncology records  ED Course and Reassessment: On patient's arrival he was hypertensive but otherwise hemodynamically stable, diaphoretic, actively vomiting and ill-appearing.  I was immediately notified by triage RN to evaluate the patient.  Patient was initially able to provide history and  was actively vomiting on exam.  With associated headache and abdominal pain concern for intracranial or intra-abdominal etiology of symptoms.  Labs and CT imaging were ordered and medications were ordered and IV access was obtained.  Patient was initially seen in a triage room and moved into a stretcher bed and shortly after my initial evaluation the patient fell out of the bed and appeared that he may have had a seizure.  Staff noted that he did not appear to be breathing and he did not have pulses and CPR was initiated.  He was transferred into a high acuity room.  The patient was noted to be PEA on the monitor.  He received epinephrine  every 5 minutes.  He was given a dose of Solu-Medrol , mag and calcium for possible electrolyte derangement as a cause of his cardiac arrest.  He was intubated for oxygenation and airway protection.  During intubation he was noted to have very pale appearing tongue and oropharynx and staff reported concern for  possible hematemesis when he had vomited earlier in his visit.  Bedside fast exam was performed by myself and did not appreciate any cardiac activity and was concerning for possible fluid in the left upper quadrant.  The patient had about 30 minutes of CPR and were unable to obtain ROSC and time of death was called at 8:11 AM.  Due to patient's age, medical examiner was contacted to see if he may be an ME case.  The patient's wife was notified by myself on of the patient's death.  Independent labs interpretation:  N/A  Independent visualization of imaging: - N/A  Consultation: - Consulted or discussed management/test interpretation w/ external professional: medical examiner  Consideration for admission or further workup: N/A, patient expired Social Determinants of health: N/A    Amount and/or Complexity of Data Reviewed Labs: ordered. Radiology: ordered.  Risk Prescription drug management.       Final diagnoses:  Cardiac arrest (HCC)  Abdominal pain, unspecified abdominal location  Nausea and vomiting, unspecified vomiting type    ED Discharge Orders     None          Ellouise Richerd POUR, DO 04/17/2024 9160  "

## 2024-05-02 NOTE — ED Notes (Signed)
 Pulse check, no pulse, PEA, lucas applied and compressions resumed

## 2024-05-02 NOTE — ED Notes (Signed)
 Patient vomiting, yelling, and diaphoretic. Patient put in a stretcher and asked to lay back and he stated he could not lay down, he had to sit up on the side of the bed. Patient sitting on the side of the bed with elbows on knees vomiting into the floor. EDP at bedside.

## 2024-05-02 NOTE — Progress Notes (Signed)
 Responded to page to support family at bedside.  Pt. Passed.  Rayleen Dade, [place], [facility], Pager [phone]

## 2024-05-02 DEATH — deceased

## 2024-08-09 ENCOUNTER — Inpatient Hospital Stay: Admitting: Adult Health
